# Patient Record
Sex: Female | Born: 1945 | Race: White | Hispanic: No | Smoking: Former smoker
Health system: Southern US, Community
[De-identification: ages and names within clinical notes are randomized; demographics above are authoritative.]

## PROBLEM LIST (undated history)

## (undated) DIAGNOSIS — M545 Low back pain, unspecified: Secondary | ICD-10-CM

## (undated) DIAGNOSIS — M1990 Unspecified osteoarthritis, unspecified site: Secondary | ICD-10-CM

## (undated) DIAGNOSIS — D3501 Benign neoplasm of right adrenal gland: Secondary | ICD-10-CM

## (undated) DIAGNOSIS — J3089 Other allergic rhinitis: Secondary | ICD-10-CM

## (undated) DIAGNOSIS — I2510 Atherosclerotic heart disease of native coronary artery without angina pectoris: Secondary | ICD-10-CM

## (undated) DIAGNOSIS — K589 Irritable bowel syndrome without diarrhea: Secondary | ICD-10-CM

## (undated) DIAGNOSIS — R011 Cardiac murmur, unspecified: Secondary | ICD-10-CM

## (undated) DIAGNOSIS — E669 Obesity, unspecified: Secondary | ICD-10-CM

## (undated) DIAGNOSIS — I358 Other nonrheumatic aortic valve disorders: Secondary | ICD-10-CM

## (undated) DIAGNOSIS — J432 Centrilobular emphysema: Secondary | ICD-10-CM

## (undated) DIAGNOSIS — K529 Noninfective gastroenteritis and colitis, unspecified: Secondary | ICD-10-CM

## (undated) DIAGNOSIS — K219 Gastro-esophageal reflux disease without esophagitis: Secondary | ICD-10-CM

## (undated) DIAGNOSIS — E785 Hyperlipidemia, unspecified: Secondary | ICD-10-CM

## (undated) DIAGNOSIS — J449 Chronic obstructive pulmonary disease, unspecified: Secondary | ICD-10-CM

## (undated) DIAGNOSIS — N63 Unspecified lump in unspecified breast: Secondary | ICD-10-CM

## (undated) DIAGNOSIS — R29898 Other symptoms and signs involving the musculoskeletal system: Secondary | ICD-10-CM

## (undated) DIAGNOSIS — J45909 Unspecified asthma, uncomplicated: Secondary | ICD-10-CM

## (undated) DIAGNOSIS — I10 Essential (primary) hypertension: Secondary | ICD-10-CM

## (undated) DIAGNOSIS — Z87891 Personal history of nicotine dependence: Secondary | ICD-10-CM

## (undated) DIAGNOSIS — M8580 Other specified disorders of bone density and structure, unspecified site: Secondary | ICD-10-CM

## (undated) DIAGNOSIS — Z8619 Personal history of other infectious and parasitic diseases: Secondary | ICD-10-CM

## (undated) HISTORY — DX: Cardiac murmur, unspecified: R01.1

## (undated) HISTORY — DX: Other allergic rhinitis: J30.89

## (undated) HISTORY — DX: Other symptoms and signs involving the musculoskeletal system: R29.898

## (undated) HISTORY — DX: Low back pain, unspecified: M54.50

## (undated) HISTORY — DX: Irritable bowel syndrome, unspecified: K58.9

## (undated) HISTORY — DX: Centrilobular emphysema: J43.2

## (undated) HISTORY — DX: Hyperlipidemia, unspecified: E78.5

## (undated) HISTORY — DX: Benign neoplasm of right adrenal gland: D35.01

## (undated) HISTORY — DX: Noninfective gastroenteritis and colitis, unspecified: K52.9

## (undated) HISTORY — DX: Obesity, unspecified: E66.9

## (undated) HISTORY — DX: Unspecified asthma, uncomplicated: J45.909

## (undated) HISTORY — DX: Gastro-esophageal reflux disease without esophagitis: K21.9

## (undated) HISTORY — DX: Other specified disorders of bone density and structure, unspecified site: M85.80

## (undated) HISTORY — DX: Personal history of other infectious and parasitic diseases: Z86.19

## (undated) HISTORY — DX: Unspecified osteoarthritis, unspecified site: M19.90

## (undated) HISTORY — DX: Other nonrheumatic aortic valve disorders: I35.8

## (undated) HISTORY — DX: Personal history of nicotine dependence: Z87.891

## (undated) HISTORY — DX: Atherosclerotic heart disease of native coronary artery without angina pectoris: I25.10

## (undated) HISTORY — DX: Low back pain: M54.5

## (undated) HISTORY — DX: Unspecified lump in unspecified breast: N63.0

## (undated) HISTORY — DX: Chronic obstructive pulmonary disease, unspecified: J44.9

## (undated) HISTORY — DX: Essential (primary) hypertension: I10

---

## 1993-07-01 HISTORY — PX: BREAST LUMPECTOMY: SHX2

## 2003-07-02 DIAGNOSIS — J449 Chronic obstructive pulmonary disease, unspecified: Secondary | ICD-10-CM

## 2003-07-02 HISTORY — DX: Chronic obstructive pulmonary disease, unspecified: J44.9

## 2004-12-29 HISTORY — PX: CARDIOVASCULAR STRESS TEST: SHX262

## 2008-07-01 HISTORY — PX: KNEE SURGERY: SHX244

## 2008-07-01 HISTORY — PX: ESOPHAGOGASTRODUODENOSCOPY: SHX1529

## 2008-07-01 HISTORY — PX: COLONOSCOPY: SHX174

## 2008-07-01 LAB — HM COLONOSCOPY

## 2008-09-23 ENCOUNTER — Encounter: Admission: RE | Admit: 2008-09-23 | Discharge: 2008-09-23 | Payer: Self-pay | Admitting: Orthopedic Surgery

## 2009-06-02 ENCOUNTER — Ambulatory Visit: Payer: Self-pay | Admitting: General Surgery

## 2009-07-01 DIAGNOSIS — K529 Noninfective gastroenteritis and colitis, unspecified: Secondary | ICD-10-CM

## 2009-07-01 HISTORY — DX: Noninfective gastroenteritis and colitis, unspecified: K52.9

## 2010-07-01 DIAGNOSIS — N63 Unspecified lump in unspecified breast: Secondary | ICD-10-CM

## 2010-07-01 HISTORY — DX: Unspecified lump in unspecified breast: N63.0

## 2010-07-01 HISTORY — PX: BREAST BIOPSY: SHX20

## 2011-01-01 LAB — LIPID PANEL
CHOLESTEROL, TOTAL: 203
HDL: 68 mg/dL (ref 35–70)
LDL CALC: 118
TRIGLYCERIDES: 86

## 2011-01-01 LAB — CBC
HGB: 13 g/dL
PLATELET COUNT: 233
WBC: 5.7

## 2011-01-01 LAB — COMPREHENSIVE METABOLIC PANEL
ALK PHOS: 67 U/L
ALT: 14
AST: 14 U/L
Bilirubin: 0.6
Creat: 0.86
GLUCOSE: 110
Sodium: 143 mmol/L (ref 137–147)

## 2011-01-01 LAB — VITAMIN D 25 HYDROXY (VIT D DEFICIENCY, FRACTURES): Vit D, 25-Hydroxy: 34

## 2011-01-01 LAB — TSH: TSH: 1.79

## 2012-07-01 DIAGNOSIS — I358 Other nonrheumatic aortic valve disorders: Secondary | ICD-10-CM

## 2012-07-01 HISTORY — PX: KNEE SURGERY: SHX244

## 2012-07-01 HISTORY — DX: Other nonrheumatic aortic valve disorders: I35.8

## 2012-12-15 ENCOUNTER — Encounter: Payer: Self-pay | Admitting: *Deleted

## 2012-12-15 DIAGNOSIS — N63 Unspecified lump in unspecified breast: Secondary | ICD-10-CM | POA: Insufficient documentation

## 2013-01-22 ENCOUNTER — Ambulatory Visit: Payer: Self-pay | Admitting: Specialist

## 2013-04-15 ENCOUNTER — Ambulatory Visit: Payer: Self-pay | Admitting: Specialist

## 2013-04-15 LAB — HEMOGLOBIN: HGB: 14.1 g/dL (ref 12.0–16.0)

## 2013-04-15 LAB — BASIC METABOLIC PANEL
Anion Gap: 5 — ABNORMAL LOW (ref 7–16)
BUN: 13 mg/dL (ref 7–18)
Chloride: 106 mmol/L (ref 98–107)
Glucose: 146 mg/dL — ABNORMAL HIGH (ref 65–99)
Osmolality: 280 (ref 275–301)
Potassium: 3.5 mmol/L (ref 3.5–5.1)
Sodium: 139 mmol/L (ref 136–145)

## 2013-04-23 ENCOUNTER — Ambulatory Visit: Payer: Self-pay | Admitting: Specialist

## 2013-05-18 ENCOUNTER — Encounter: Payer: Self-pay | Admitting: General Surgery

## 2013-06-08 ENCOUNTER — Encounter: Payer: Self-pay | Admitting: General Surgery

## 2013-06-08 ENCOUNTER — Ambulatory Visit (INDEPENDENT_AMBULATORY_CARE_PROVIDER_SITE_OTHER): Payer: Medicare Other | Admitting: General Surgery

## 2013-06-08 VITALS — BP 162/62 | HR 78 | Resp 16 | Ht 65.0 in | Wt 222.0 lb

## 2013-06-08 DIAGNOSIS — Z1239 Encounter for other screening for malignant neoplasm of breast: Secondary | ICD-10-CM | POA: Insufficient documentation

## 2013-06-08 NOTE — Patient Instructions (Signed)
Patient to return in 1 year with a screening mammogram. Patient to call with any new questions or concerns.

## 2013-06-08 NOTE — Progress Notes (Signed)
Patient ID: Monica Wise, female   DOB: 03/11/1946, 67 y.o.   MRN: [id number]  Chief Complaint  Patient presents with  . Follow-up    1 year screening mammogram    HPI Monica Wise is a 67 y.o. female who presents for a breast evaluation. The most recent mammogram was done on 05/17/13. Patient does perform regular self breast checks and gets regular mammograms done.  The patient denies any new breast problems at this time.    HPI  Past Medical History  Diagnosis Date  . Allergy   . Asthma   . Personal history of tobacco use, presenting hazards to health   . Diarrhea 2011  . COPD (chronic obstructive pulmonary disease) 2005  . Arthritis   . Lump or mass in breast 2012    right breast US guided finesse 1 o'clock  . Special screening for malignant neoplasms, colon   . Obesity, unspecified   . Hyperlipidemia   . GERD (gastroesophageal reflux disease)   . Heart murmur     Past Surgical History  Procedure Laterality Date  . Breast surgery Left 1995    lumpectomy, benign mass  . Knee surgery Right 2010    arthroscopic  . Colonoscopy  2010    Dr. Lemar Livings  . Breast biopsy Right 2012    Family History  Problem Relation Age of Onset  . Diverticulitis Mother   . Cancer Other     unknown family member with colon cancer  . Colon polyps Other     unknown family member    Social History History  Substance Use Topics  . Smoking status: Former Smoker -- 1.00 packs/day for 30 years    Types: Cigarettes  . Smokeless tobacco: Not on file  . Alcohol Use: Yes     Comment: ocassionally    Allergies  Allergen Reactions  . Codeine Nausea And Vomiting  . Demerol [Meperidine] Nausea And Vomiting  . Sulphur [Sulfur] Other (See Comments)    Unknown reaction per patient.     Current Outpatient Prescriptions  Medication Sig Dispense Refill  . albuterol (PROVENTIL HFA;VENTOLIN HFA) 108 (90 BASE) MCG/ACT inhaler Inhale 1-2 puffs into the lungs every 6 (six) hours as needed for  wheezing or shortness of breath.      Marland Kitchen aspirin 81 MG tablet Take 81 mg by mouth daily.      . cetirizine (ZYRTEC) 10 MG tablet Take 10 mg by mouth daily.      . Cholecalciferol (VITAMIN D-3 PO) Take by mouth.      . Glucosamine-Chondroitin (MOVE FREE PO) Take by mouth.      . Loperamide HCl (ANTI-DIARRHEAL PO) Take by mouth.      . meloxicam (MOBIC) 15 MG tablet       . montelukast (SINGULAIR) 10 MG tablet       . Multiple Vitamin (MULTIVITAMIN) tablet Take 1 tablet by mouth daily.      . OMEGA-3 KRILL OIL PO Take by mouth.      Marland Kitchen omeprazole (PRILOSEC) 20 MG capsule       . OxyCODONE (OXYCONTIN) 10 mg T12A 12 hr tablet Take 10 mg by mouth every 12 (twelve) hours.      . Pseudoephedrine HCl (NASAL DECONGESTANT PO) Take by mouth.      . SPIRIVA HANDIHALER 18 MCG inhalation capsule       . SYMBICORT 160-4.5 MCG/ACT inhaler       . UNABLE TO FIND Med Name: Monica Wise      .  ZETIA 10 MG tablet        No current facility-administered medications for this visit.    Review of Systems Review of Systems  Constitutional: Negative.   Respiratory: Negative.   Cardiovascular: Negative.     Blood pressure 162/62, pulse 78, resp. rate 16, height 5\' 5"  (1.651 m), weight 222 lb (100.699 kg).  Physical Exam Physical Exam  Constitutional: She is oriented to person, place, and time. She appears well-developed and well-nourished.  Neck: No thyromegaly present.  Cardiovascular: Normal rate and regular rhythm.   Murmur heard.  Systolic murmur is present with a grade of 3/6  Pulmonary/Chest: Effort normal and breath sounds normal. Right breast exhibits no inverted nipple, no mass, no nipple discharge, no skin change and no tenderness. Left breast exhibits no inverted nipple, no mass, no nipple discharge, no skin change and no tenderness.  Lymphadenopathy:    She has no cervical adenopathy.    She has no axillary adenopathy.  Neurological: She is alert and oriented to person, place, and time.  Skin:  Skin is warm and dry.    Data Reviewed Bilateral mammograms dated May 17, 2013 complete at [facility] were reviewed. No interval change. Stable nodularity. BI-RAD-2.  Assessment    Benign breast exam.     Plan    The patient was offered to have her annual exams completed with her PCP. She declined. Followup examination here in one year with repeat bilateral screening mammograms.        Monica Wise 06/08/2013, 9:26 PM

## 2013-07-06 ENCOUNTER — Encounter: Payer: Self-pay | Admitting: General Surgery

## 2014-05-02 ENCOUNTER — Encounter: Payer: Self-pay | Admitting: General Surgery

## 2014-05-24 ENCOUNTER — Encounter: Payer: Self-pay | Admitting: General Surgery

## 2014-05-24 ENCOUNTER — Ambulatory Visit (INDEPENDENT_AMBULATORY_CARE_PROVIDER_SITE_OTHER): Payer: Medicare Other | Admitting: General Surgery

## 2014-05-24 VITALS — BP 134/72 | HR 82 | Resp 16 | Ht 65.0 in | Wt 223.0 lb

## 2014-05-24 DIAGNOSIS — Z853 Personal history of malignant neoplasm of breast: Secondary | ICD-10-CM

## 2014-05-24 DIAGNOSIS — Z1239 Encounter for other screening for malignant neoplasm of breast: Secondary | ICD-10-CM

## 2014-05-24 NOTE — Progress Notes (Signed)
Patient ID: Monica Wise, female   DOB: 04-Feb-1946, 68 y.o.   MRN: [id number]  Chief Complaint  Patient presents with  . Follow-up    1 year bilateral screening mammogram     HPI Monica Wise is a 68 y.o. female who presents for a breast evaluation. The most recent mammogram was done on 05/18/14. Patient does perform occasional self breast checks and gets regular mammograms done.  The patient denies any new problems at this time.    HPI  Past Medical History  Diagnosis Date  . Allergy   . Asthma   . Personal history of tobacco use, presenting hazards to health   . Diarrhea 2011  . COPD (chronic obstructive pulmonary disease) 2005  . Arthritis   . Lump or mass in breast 2012    right breast US guided finesse 1 o'clock  . Special screening for malignant neoplasms, colon   . Obesity, unspecified   . Hyperlipidemia   . GERD (gastroesophageal reflux disease)   . Heart murmur     Past Surgical History  Procedure Laterality Date  . Breast surgery Left 1995    lumpectomy, benign mass  . Knee surgery Right 2010    arthroscopic  . Colonoscopy  2010    Dr. Bary Castilla, hyperplastic polyps identified. Follow-up exam planned for 2020.  Marland Kitchen Breast biopsy Right 2012  . Knee surgery Left 2014    Family History  Problem Relation Age of Onset  . Diverticulitis Mother   . Cancer Other     unknown family member with colon cancer  . Colon polyps Other     unknown family member    Social History History  Substance Use Topics  . Smoking status: Former Smoker -- 1.00 packs/day for 30 years    Types: Cigarettes  . Smokeless tobacco: Not on file  . Alcohol Use: Yes     Comment: ocassionally    Allergies  Allergen Reactions  . Codeine Nausea And Vomiting  . Demerol [Meperidine] Nausea And Vomiting  . Sulphur [Sulfur] Other (See Comments)    Unknown reaction per patient.     Current Outpatient Prescriptions  Medication Sig Dispense Refill  . albuterol (PROVENTIL HFA;VENTOLIN HFA) 108  (90 BASE) MCG/ACT inhaler Inhale 1-2 puffs into the lungs every 6 (six) hours as needed for wheezing or shortness of breath.    Marland Kitchen aspirin 81 MG tablet Take 81 mg by mouth daily.    . cetirizine (ZYRTEC) 10 MG tablet Take 10 mg by mouth daily.    . Cholecalciferol (VITAMIN D-3 PO) Take by mouth.    . Glucosamine-Chondroitin (MOVE FREE PO) Take by mouth.    . Loperamide HCl (ANTI-DIARRHEAL PO) Take by mouth.    . montelukast (SINGULAIR) 10 MG tablet     . Multiple Vitamin (MULTIVITAMIN) tablet Take 1 tablet by mouth daily.    . OMEGA-3 KRILL OIL PO Take by mouth.    Marland Kitchen omeprazole (PRILOSEC) 20 MG capsule     . OxyCODONE (OXYCONTIN) 10 mg T12A 12 hr tablet Take 10 mg by mouth as needed.     . Pseudoephedrine HCl (NASAL DECONGESTANT PO) Take by mouth.    . SPIRIVA HANDIHALER 18 MCG inhalation capsule     . SYMBICORT 160-4.5 MCG/ACT inhaler     . UNABLE TO FIND Med Name: Margaretmary Lombard    . ZETIA 10 MG tablet      No current facility-administered medications for this visit.    Review of Systems Review of  Systems  Constitutional: Negative.   Respiratory: Negative.   Cardiovascular: Negative.     Blood pressure 134/72, pulse 82, resp. rate 16, height 5\' 5"  (1.651 m), weight 223 lb (101.152 kg).  Physical Exam Physical Exam  Constitutional: She is oriented to person, place, and time. She appears well-developed and well-nourished.  Neck: Neck supple. No thyromegaly present.  Cardiovascular: Normal rate and regular rhythm.   Murmur heard.  Systolic murmur is present with a grade of 2/6  Pulmonary/Chest: Effort normal and breath sounds normal. Right breast exhibits no inverted nipple, no mass, no nipple discharge, no skin change and no tenderness. Left breast exhibits no inverted nipple, no mass, no nipple discharge, no skin change and no tenderness.  Lymphadenopathy:    She has no cervical adenopathy.    She has no axillary adenopathy.  Neurological: She is alert and oriented to person, place,  and time.  Skin: Skin is warm and dry.    Data Reviewed Bilateral screening mammograms dated 05/18/2014 were reviewed and compared to previous studies. BI-RADS-2.  Assessment    Benign breast exam.    Plan    The patient's PCP is left the area. A list of potential choices will be provided to the patient. We'll plan for a follow-up exam in one year with screening mammograms prior to that visit.    PCP:  No Pcp Per Patient   Robert Bellow 05/25/2014, 2:32 PM

## 2014-05-24 NOTE — Patient Instructions (Signed)
Patient to return in 1 year with bilateral screening mammogram. Continue self breast exams. Call office for any new breast issues or concerns.  

## 2014-05-25 ENCOUNTER — Encounter: Payer: Self-pay | Admitting: General Surgery

## 2014-06-06 ENCOUNTER — Encounter: Payer: Self-pay | Admitting: General Surgery

## 2014-06-17 ENCOUNTER — Ambulatory Visit (INDEPENDENT_AMBULATORY_CARE_PROVIDER_SITE_OTHER): Payer: Medicare Other | Admitting: Family Medicine

## 2014-06-17 ENCOUNTER — Ambulatory Visit: Payer: Self-pay | Admitting: Family Medicine

## 2014-06-17 ENCOUNTER — Encounter: Payer: Self-pay | Admitting: Family Medicine

## 2014-06-17 ENCOUNTER — Telehealth: Payer: Self-pay | Admitting: Family Medicine

## 2014-06-17 VITALS — BP 196/90 | HR 71 | Temp 98.2°F | Ht 65.0 in | Wt 221.0 lb

## 2014-06-17 DIAGNOSIS — I10 Essential (primary) hypertension: Secondary | ICD-10-CM | POA: Insufficient documentation

## 2014-06-17 DIAGNOSIS — M170 Bilateral primary osteoarthritis of knee: Secondary | ICD-10-CM

## 2014-06-17 DIAGNOSIS — Z87891 Personal history of nicotine dependence: Secondary | ICD-10-CM | POA: Insufficient documentation

## 2014-06-17 DIAGNOSIS — M1990 Unspecified osteoarthritis, unspecified site: Secondary | ICD-10-CM | POA: Insufficient documentation

## 2014-06-17 DIAGNOSIS — K219 Gastro-esophageal reflux disease without esophagitis: Secondary | ICD-10-CM | POA: Insufficient documentation

## 2014-06-17 DIAGNOSIS — E785 Hyperlipidemia, unspecified: Secondary | ICD-10-CM | POA: Insufficient documentation

## 2014-06-17 DIAGNOSIS — E669 Obesity, unspecified: Secondary | ICD-10-CM

## 2014-06-17 DIAGNOSIS — J432 Centrilobular emphysema: Secondary | ICD-10-CM | POA: Insufficient documentation

## 2014-06-17 DIAGNOSIS — J449 Chronic obstructive pulmonary disease, unspecified: Secondary | ICD-10-CM

## 2014-06-17 DIAGNOSIS — K529 Noninfective gastroenteritis and colitis, unspecified: Secondary | ICD-10-CM | POA: Insufficient documentation

## 2014-06-17 DIAGNOSIS — J3089 Other allergic rhinitis: Secondary | ICD-10-CM | POA: Insufficient documentation

## 2014-06-17 DIAGNOSIS — J309 Allergic rhinitis, unspecified: Secondary | ICD-10-CM

## 2014-06-17 MED ORDER — TIOTROPIUM BROMIDE MONOHYDRATE 18 MCG IN CAPS: 18.0000 ug | ORAL_CAPSULE | Freq: Every day | RESPIRATORY_TRACT | Status: DC

## 2014-06-17 MED ORDER — AMLODIPINE BESYLATE 5 MG PO TABS
5.0000 mg | ORAL_TABLET | Freq: Every day | ORAL | Status: DC
Start: 2014-06-17 — End: 2014-07-04

## 2014-06-17 MED ORDER — BUDESONIDE-FORMOTEROL FUMARATE 160-4.5 MCG/ACT IN AERO: 2.0000 | INHALATION_SPRAY | Freq: Two times a day (BID) | RESPIRATORY_TRACT | Status: DC

## 2014-06-17 MED ORDER — EZETIMIBE 10 MG PO TABS: 10.0000 mg | ORAL_TABLET | Freq: Every day | ORAL | Status: DC

## 2014-06-17 NOTE — Progress Notes (Signed)
Pre visit review using our clinic review tool, if applicable. No additional management support is needed unless otherwise documented below in the visit note. 

## 2014-06-17 NOTE — Progress Notes (Signed)
BP 196/90 mmHg  Pulse 71  Temp(Src) 98.2 F (36.8 C) (Tympanic)  Ht 5\' 5"  (1.651 m)  Wt 221 lb (100.245 kg)  BMI 36.78 kg/m2  SpO2 97%   CC: new pt to establish care  Subjective:    Patient ID: Monica Wise, female    DOB: 1945/08/15, 68 y.o.   MRN: [id number]  HPI: Monica Wise is a 68 y.o. female presenting on 06/17/2014 for Establish Care   Presents to establish care. Prior saw Willia Craze at [facility].   Wonders about IBS - doesn't do any appointments in the morning. Dicyclomine did not help in past. Diarrhea may have started after NSAID use.  Knee arthritis - s/p L knee surgery by Dr Tamala Julian 2014 (?meniscectomy). S/p R knee surgery 2011. On oxycodone prn. But states tramadol works better. Has cane but doesn't use.  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 Stable back pain. Occasional shooting pain down legs. No numbness of legs. Numbness in hands R>L - worse with driving. H/o CTS.   HLD - intolerant to crestor in past due to myalgias. Now on zetia, without myalgias. Declines further statin trial at this time.  Chronic sinus congestion - with perennial allergic rhinitis. Taking claritin and singulair and pseudophed regularly.   HTN - no h/o this in past. Markedly elevated today.  No HA, vision changes, CP/tightness, SOB, leg swelling.  Preventative: Last CPE 03/2013 Mammogram 05/2014 Flu shot - done Presumed pneumovax 2001 G1P1  Lives alone, 2 feral cats Occupation: retired, worked for [employer] Edu: [employer] Activity: works in yard, unable to walk 2/2 knee pain Diet: good water, fruits/vegetables daily  Relevant past medical, surgical, family and social history reviewed and updated as indicated. Interim medical history since our last visit reviewed. Allergies and medications reviewed and updated. Current Outpatient Prescriptions on File Prior to Visit  Medication Sig  . albuterol (PROVENTIL HFA;VENTOLIN HFA) 108 (90 BASE) MCG/ACT inhaler Inhale 1-2 puffs into the lungs  every 6 (six) hours as needed for wheezing or shortness of breath.  Marland Kitchen aspirin 81 MG tablet Take 81 mg by mouth daily.  . cetirizine (ZYRTEC) 10 MG tablet Take 10 mg by mouth daily.  . Cholecalciferol (VITAMIN D-3 PO) Take by mouth.  . Loperamide HCl (ANTI-DIARRHEAL PO) Take by mouth.  . montelukast (SINGULAIR) 10 MG tablet Take 10 mg by mouth at bedtime.   . Multiple Vitamin (MULTIVITAMIN) tablet Take 1 tablet by mouth daily.  Marland Kitchen omeprazole (PRILOSEC) 20 MG capsule Take 20 mg by mouth daily.   . Pseudoephedrine HCl (NASAL DECONGESTANT PO) Take 1 tablet by mouth daily as needed.    No current facility-administered medications on file prior to visit.    Review of Systems Per HPI unless specifically indicated above     Objective:    BP 196/90 mmHg  Pulse 71  Temp(Src) 98.2 F (36.8 C) (Tympanic)  Ht 5\' 5"  (1.651 m)  Wt 221 lb (100.245 kg)  BMI 36.78 kg/m2  SpO2 97%  Wt Readings from Last 3 Encounters:  06/17/14 221 lb (100.245 kg)  05/24/14 223 lb (101.152 kg)  06/08/13 222 lb (100.699 kg)    Physical Exam  Constitutional: She appears well-developed and well-nourished. No distress.  HENT:  Head: Normocephalic and atraumatic.  Mouth/Throat: Oropharynx is clear and moist. No oropharyngeal exudate.  Eyes: Conjunctivae and EOM are normal. Pupils are equal, round, and reactive to light. No scleral icterus.  Neck: Normal range of motion. Neck supple. No thyromegaly present.  Cardiovascular: Normal rate, regular rhythm and intact distal pulses.   Murmur (3/6 SEM best at LUSB) heard. Pulmonary/Chest: Effort normal and breath sounds normal. No respiratory distress. She has no wheezes. She has no rales.  Musculoskeletal: She exhibits no edema.  Lymphadenopathy:    She has no cervical adenopathy.  Skin: Skin is warm and dry. No rash noted.  Psychiatric: She has a normal mood and affect.  Nursing note and vitals reviewed.  No results found for this or any previous visit.      Assessment & Plan:   Problem List Items Addressed This Visit    Perennial allergic rhinitis    Advised against using pseudophed decongestant.    Osteoarthritis    Predominantly knees. S/p bilateral knee surgery. Discussed may need replacement to address chronic knee pain if severe OA, not interested in further ortho evaluation for knees,     Relevant Medications      oxyCODONE-acetaminophen (PERCOCET/ROXICET) 5-325 MG per tablet   Obesity    Unable to exercise 2/2 knee pain.    Hyperlipidemia    Continue zetia, check FLP next fasting labs. Intolerant to crestor, does not want any further statins    Relevant Medications      furosemide (LASIX) 20 MG tablet      ezetimibe (ZETIA) 10 MG tablet      amLODIpine (NORVASC) tablet   GERD (gastroesophageal reflux disease)    Continue omperazole daily, pepcid prn.    Relevant Medications      famotidine (PEPCID) 20 MG tablet   Ex-smoker    Continue to encourage abstinence    Essential hypertension - Primary    Marked elevation today - discussed reasons for treating hypertension, advised stop decongestant. Provided with DASH diet handout. Pt will monitor bp at home with her home cuff, and if persistently >140/90 will start amlodipine 5mg  daily prescription provided today. Pt agrees with plan.    Relevant Medications      furosemide (LASIX) 20 MG tablet      ezetimibe (ZETIA) 10 MG tablet      amLODIpine (NORVASC) tablet   COPD (chronic obstructive pulmonary disease)    symbicort and spiriva refilled today.    Relevant Medications      tiotropium (SPIRIVA HANDIHALER) 18 MCG inhalation capsule      budesonide-formoterol (SYMBICORT) 160-4.5 MCG/ACT inhaler   Chronic diarrhea    ?IBS related continue to monitor on prn immodium.        Follow up plan: Return in about 3 months (around 09/16/2014), or as needed, for medicare wellness.

## 2014-06-17 NOTE — Assessment & Plan Note (Signed)
symbicort and spiriva refilled today.

## 2014-06-17 NOTE — Patient Instructions (Addendum)
Check which pain medication you take at home and let me know.  Call your insurance about the shingles shot to see if it is covered or how much it would cost and where is cheaper (here or pharmacy).  If you want to receive here, call for nurse visit. Return as needed or in 3 months for medicare wellness visit Return in 2 wks for bp check.  Your blood pressure was too high! Monitor blood pressure at home over next 1-2 days and if persistently >!40/90, start amlodipine 5mg  daily. Prescription printed out today. Work on decreased salt and sodium in the diet.  DASH Eating Plan DASH stands for "Dietary Approaches to Stop Hypertension." The DASH eating plan is a healthy eating plan that has been shown to reduce high blood pressure (hypertension). Additional health benefits may include reducing the risk of type 2 diabetes mellitus, heart disease, and stroke. The DASH eating plan may also help with weight loss. WHAT DO I NEED TO KNOW ABOUT THE DASH EATING PLAN? For the DASH eating plan, you will follow these general guidelines:  Choose foods with a percent daily value for sodium of less than 5% (as listed on the food label).  Use salt-free seasonings or herbs instead of table salt or sea salt.  Check with your health care provider or pharmacist before using salt substitutes.  Eat lower-sodium products, often labeled as "lower sodium" or "no salt added."  Eat fresh foods.  Eat more vegetables, fruits, and low-fat dairy products.  Choose whole grains. Look for the word "whole" as the first word in the ingredient list.  Choose fish and skinless chicken or [place] more often than red meat. Limit fish, poultry, and meat to 6 oz (170 g) each day.  Limit sweets, desserts, sugars, and sugary drinks.  Choose heart-healthy fats.  Limit cheese to 1 oz (28 g) per day.  Eat more home-cooked food and less restaurant, buffet, and fast food.  Limit fried foods.  Cook foods using methods other than  frying.  Limit canned vegetables. If you do use them, rinse them well to decrease the sodium.  When eating at a restaurant, ask that your food be prepared with less salt, or no salt if possible. WHAT FOODS CAN I EAT? Seek help from a dietitian for individual calorie needs. Grains Whole grain or whole wheat bread. Brown rice. Whole grain or whole wheat pasta. Quinoa, bulgur, and whole grain cereals. Low-sodium cereals. Corn or whole wheat flour tortillas. Whole grain cornbread. Whole grain crackers. Low-sodium crackers. Vegetables Fresh or frozen vegetables (raw, steamed, roasted, or grilled). Low-sodium or reduced-sodium tomato and vegetable juices. Low-sodium or reduced-sodium tomato sauce and paste. Low-sodium or reduced-sodium canned vegetables.  Fruits All fresh, canned (in natural juice), or frozen fruits. Meat and Other Protein Products Ground beef (85% or leaner), grass-fed beef, or beef trimmed of fat. Skinless chicken or [place]. Ground chicken or [place]. Pork trimmed of fat. All fish and seafood. Eggs. Dried beans, peas, or lentils. Unsalted nuts and seeds. Unsalted canned beans. Dairy Low-fat dairy products, such as skim or 1% milk, 2% or reduced-fat cheeses, low-fat ricotta or cottage cheese, or plain low-fat yogurt. Low-sodium or reduced-sodium cheeses. Fats and Oils Tub margarines without trans fats. Light or reduced-fat mayonnaise and salad dressings (reduced sodium). Avocado. Safflower, olive, or canola oils. Natural peanut or almond butter. Other Unsalted popcorn and pretzels. The items listed above may not be a complete list of recommended foods or beverages. Contact your dietitian for more options.  WHAT FOODS ARE NOT RECOMMENDED? Grains White bread. White pasta. White rice. Refined cornbread. Bagels and croissants. Crackers that contain trans fat. Vegetables Creamed or fried vegetables. Vegetables in a cheese sauce. Regular canned vegetables. Regular canned tomato sauce  and paste. Regular tomato and vegetable juices. Fruits Dried fruits. Canned fruit in light or heavy syrup. Fruit juice. Meat and Other Protein Products Fatty cuts of meat. Ribs, chicken wings, bacon, sausage, bologna, salami, chitterlings, fatback, hot dogs, bratwurst, and packaged luncheon meats. Salted nuts and seeds. Canned beans with salt. Dairy Whole or 2% milk, cream, half-and-half, and cream cheese. Whole-fat or sweetened yogurt. Full-fat cheeses or blue cheese. Nondairy creamers and whipped toppings. Processed cheese, cheese spreads, or cheese curds. Condiments Onion and garlic salt, seasoned salt, table salt, and sea salt. Canned and packaged gravies. Worcestershire sauce. Tartar sauce. Barbecue sauce. Teriyaki sauce. Soy sauce, including reduced sodium. Steak sauce. Fish sauce. Oyster sauce. Cocktail sauce. Horseradish. Ketchup and mustard. Meat flavorings and tenderizers. Bouillon cubes. Hot sauce. Tabasco sauce. Marinades. Taco seasonings. Relishes. Fats and Oils Butter, stick margarine, lard, shortening, ghee, and bacon fat. Coconut, palm kernel, or palm oils. Regular salad dressings. Other Pickles and olives. Salted popcorn and pretzels. The items listed above may not be a complete list of foods and beverages to avoid. Contact your dietitian for more information. WHERE CAN I FIND MORE INFORMATION? National Heart, Lung, and Blood Institute: travelstabloid.com Document Released: 06/06/2011 Document Revised: 11/01/2013 Document Reviewed: 04/21/2013 [facility] Patient Information 2015 [place], [place]. This information is not intended to replace advice given to you by your health care provider. Make sure you discuss any questions you have with your health care provider.

## 2014-06-17 NOTE — Assessment & Plan Note (Signed)
Marked elevation today - discussed reasons for treating hypertension, advised stop decongestant. Provided with DASH diet handout. Pt will monitor bp at home with her home cuff, and if persistently >140/90 will start amlodipine 5mg  daily prescription provided today. Pt agrees with plan.

## 2014-06-17 NOTE — Assessment & Plan Note (Signed)
Continue to encourage abstinence

## 2014-06-17 NOTE — Assessment & Plan Note (Signed)
Unable to exercise 2/2 knee pain.

## 2014-06-17 NOTE — Assessment & Plan Note (Signed)
Continue zetia, check FLP next fasting labs. Intolerant to crestor, does not want any further statins

## 2014-06-17 NOTE — Assessment & Plan Note (Addendum)
Predominantly knees. S/p bilateral knee surgery. Discussed may need replacement to address chronic knee pain if severe OA, not interested in further ortho evaluation for knees,

## 2014-06-17 NOTE — Telephone Encounter (Signed)
Noted. Chart updated.

## 2014-06-17 NOTE — Assessment & Plan Note (Signed)
Advised against using pseudophed decongestant.

## 2014-06-17 NOTE — Telephone Encounter (Signed)
Patient called and said she was suppose to let you know Oxycodone HCL 5mg .

## 2014-06-17 NOTE — Assessment & Plan Note (Signed)
?  IBS related continue to monitor on prn immodium.

## 2014-06-17 NOTE — Assessment & Plan Note (Signed)
Continue omperazole daily, pepcid prn.

## 2014-06-20 ENCOUNTER — Telehealth: Payer: Self-pay | Admitting: Family Medicine

## 2014-06-20 NOTE — Telephone Encounter (Signed)
emmi mailed  °

## 2014-06-22 ENCOUNTER — Ambulatory Visit: Payer: BC Managed Care – PPO | Admitting: General Surgery

## 2014-06-22 ENCOUNTER — Ambulatory Visit: Payer: Self-pay | Admitting: Internal Medicine

## 2014-07-02 ENCOUNTER — Encounter: Payer: Self-pay | Admitting: Family Medicine

## 2014-07-02 DIAGNOSIS — K589 Irritable bowel syndrome without diarrhea: Secondary | ICD-10-CM | POA: Insufficient documentation

## 2014-07-04 ENCOUNTER — Encounter: Payer: Self-pay | Admitting: Family Medicine

## 2014-07-04 ENCOUNTER — Ambulatory Visit (INDEPENDENT_AMBULATORY_CARE_PROVIDER_SITE_OTHER): Payer: Medicare Other | Admitting: Family Medicine

## 2014-07-04 VITALS — BP 190/90 | HR 76 | Temp 98.0°F | Wt 219.8 lb

## 2014-07-04 DIAGNOSIS — I10 Essential (primary) hypertension: Secondary | ICD-10-CM

## 2014-07-04 DIAGNOSIS — Z23 Encounter for immunization: Secondary | ICD-10-CM | POA: Diagnosis not present

## 2014-07-04 MED ORDER — AMLODIPINE BESYLATE 2.5 MG PO TABS
2.5000 mg | ORAL_TABLET | Freq: Every day | ORAL | Status: DC
Start: 2014-07-04 — End: 2014-09-19

## 2014-07-04 NOTE — Assessment & Plan Note (Addendum)
Persistently elevated in office - will start amlodipine 2.5mg  daily. I recommended she buy new bp cuff for arm. Again discussed DASH diet.  RTC 1 mo for f/u HTN  Pt agrees with plan.

## 2014-07-04 NOTE — Patient Instructions (Addendum)
Consider getting automatic arm cuff (not forearm) at local pharmacy.  Let's start amlodipine 2.5mg  once daily - new dose sent to pharmacy. Return in 1 month for blood pressure follow up. zostavax today.

## 2014-07-04 NOTE — Addendum Note (Signed)
Addended by: Emelia Salisbury C on: 07/04/2014 04:59 PM   Modules accepted: Orders

## 2014-07-04 NOTE — Progress Notes (Signed)
BP 190/90 mmHg  Pulse 76  Temp(Src) 98 F (36.7 C) (Oral)  Wt 219 lb 12 oz (99.678 kg)   CC: 2 wk f/u HTN  Subjective:    Patient ID: Monica Wise, female    DOB: 02-27-1946, 69 y.o.   MRN: [id number]  HPI: Monica Wise is a 69 y.o. female presenting on 07/04/2014 for Follow-up   Patient established with our office on 06/17/2014 without known diagnosis of hypertension and at that time in office her blood pressure was 196/90.  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 At that time we recommended she stop decongestants and provided her with a DASH diet handout. We also provided her with a prescription for amlodipine 5mg  daily. She returns today for 2 wk f/u visit.  She did not start amlodipine. She has been monitoring at home [id number] systolic. No HA, vision changes, CP/tightness, leg swelling. Stays mildly dyspneic   She does have furosemide she takes intermittently.  She has had echo done at [facility] by Dr Ubaldo Glassing  Body mass index is 36.57 kg/(m^2).   Relevant past medical, surgical, family and social history reviewed and updated as indicated. Interim medical history since our last visit reviewed. Allergies and medications reviewed and updated. Current Outpatient Prescriptions on File Prior to Visit  Medication Sig  . albuterol (PROVENTIL HFA;VENTOLIN HFA) 108 (90 BASE) MCG/ACT inhaler Inhale 1-2 puffs into the lungs every 6 (six) hours as needed for wheezing or shortness of breath.  Marland Kitchen aspirin 81 MG tablet Take 81 mg by mouth daily.  . budesonide-formoterol (SYMBICORT) 160-4.5 MCG/ACT inhaler Inhale 2 puffs into the lungs 2 (two) times daily.  . cetirizine (ZYRTEC) 10 MG tablet Take 10 mg by mouth daily.  . Cholecalciferol (VITAMIN D-3 PO) Take by mouth.  . ezetimibe (ZETIA) 10 MG tablet Take 1 tablet (10 mg total) by mouth daily.  . famotidine (PEPCID) 20 MG tablet Take 20 mg by mouth daily.  . furosemide  (LASIX) 20 MG tablet Take 20 mg by mouth daily as needed for fluid.   . Glucosamine-Chondroitin (MOVE FREE PO) Take by mouth daily.  . Loperamide HCl (ANTI-DIARRHEAL PO) Take by mouth.  . Misc Natural Products (SUPER ENERGY HERBAL COMPLEX PO) Take by mouth. GINSANA  . montelukast (SINGULAIR) 10 MG tablet Take 10 mg by mouth at bedtime.   . Multiple Vitamin (MULTIVITAMIN) tablet Take 1 tablet by mouth daily.  . Omega-3 Krill Oil 500 MG CAPS Take 500 mg by mouth daily.  Marland Kitchen omeprazole (PRILOSEC) 20 MG capsule Take 20 mg by mouth daily.   Marland Kitchen oxycodone (OXY-IR) 5 MG capsule Take 5 mg by mouth every 6 (six) hours as needed for pain.  . potassium chloride (K-DUR) 10 MEQ tablet Take 10 mEq by mouth daily as needed. With lasix  . tiotropium (SPIRIVA HANDIHALER) 18 MCG inhalation capsule Place 1 capsule (18 mcg total) into inhaler and inhale daily.  . Pseudoephedrine HCl (NASAL DECONGESTANT PO) Take 1 tablet by mouth daily as needed.    No current facility-administered medications on file prior to visit.    Review of Systems Per HPI unless specifically indicated above     Objective:    BP 190/90 mmHg  Pulse 76  Temp(Src) 98 F (36.7 C) (Oral)  Wt 219 lb 12 oz (99.678 kg)  Wt Readings from Last 3 Encounters:  07/04/14 219 lb 12 oz (99.678 kg)  06/17/14 221 lb (  100.245 kg)  05/24/14 223 lb (101.152 kg)    Physical Exam  Constitutional: She appears well-developed and well-nourished. No distress.  HENT:  Mouth/Throat: Oropharynx is clear and moist. No oropharyngeal exudate.  Cardiovascular: Normal rate, regular rhythm and intact distal pulses.   Murmur (3/6 SEM best at LUSB with radiation to carotids) heard. Pulmonary/Chest: Effort normal and breath sounds normal. No respiratory distress. She has no wheezes. She has no rales.  Musculoskeletal: She exhibits no edema.  Skin: Skin is warm and dry. No rash noted.  Psychiatric: She has a normal mood and affect.  Nursing note and vitals  reviewed.  No results found for this or any previous visit.    Assessment & Plan:   Problem List Items Addressed This Visit    Essential hypertension - Primary    Persistently elevated in office - will start amlodipine 2.5mg  daily. I recommended she buy new bp cuff for arm. Again discussed DASH diet.  RTC 1 mo for f/u HTN  Pt agrees with plan.    Relevant Medications      amLODIpine (NORVASC)  tablet       Follow up plan: Return in about 4 weeks (around 08/01/2014), or as needed, for follow up visit.

## 2014-07-04 NOTE — Progress Notes (Signed)
Pre visit review using our clinic review tool, if applicable. No additional management support is needed unless otherwise documented below in the visit note. 

## 2014-07-06 ENCOUNTER — Encounter: Payer: Self-pay | Admitting: *Deleted

## 2014-07-07 ENCOUNTER — Telehealth: Payer: Self-pay

## 2014-07-07 NOTE — Telephone Encounter (Signed)
Sounds like she has had a bad local reaction to shingles shot. Would recommend warm compresses to skin. Could consider NSAID like aleve 220mg  bid with food if she can tolerate these meds. Should resolve over next week.

## 2014-07-07 NOTE — Telephone Encounter (Signed)
Patient notified and verbalized understanding. 

## 2014-07-07 NOTE — Telephone Encounter (Signed)
Pt had shingles vaccine on 07/04/2014 and now has redness, itching, soreness and swelling the size of golf ball. No fever and no SOB. Pt wants to know how long will last and should pt take any type med. Pt request cb.

## 2014-07-12 ENCOUNTER — Encounter: Payer: Self-pay | Admitting: Family Medicine

## 2014-07-28 DIAGNOSIS — E6609 Other obesity due to excess calories: Secondary | ICD-10-CM | POA: Diagnosis not present

## 2014-07-28 DIAGNOSIS — R011 Cardiac murmur, unspecified: Secondary | ICD-10-CM | POA: Diagnosis not present

## 2014-07-28 DIAGNOSIS — E784 Other hyperlipidemia: Secondary | ICD-10-CM | POA: Diagnosis not present

## 2014-07-28 DIAGNOSIS — I10 Essential (primary) hypertension: Secondary | ICD-10-CM | POA: Diagnosis not present

## 2014-08-01 ENCOUNTER — Encounter: Payer: Self-pay | Admitting: Family Medicine

## 2014-08-04 ENCOUNTER — Encounter: Payer: Self-pay | Admitting: Family Medicine

## 2014-08-04 ENCOUNTER — Ambulatory Visit (INDEPENDENT_AMBULATORY_CARE_PROVIDER_SITE_OTHER): Payer: Medicare Other | Admitting: Family Medicine

## 2014-08-04 VITALS — BP 164/80 | HR 64 | Temp 97.8°F | Wt 225.5 lb

## 2014-08-04 DIAGNOSIS — J3089 Other allergic rhinitis: Secondary | ICD-10-CM

## 2014-08-04 DIAGNOSIS — I10 Essential (primary) hypertension: Secondary | ICD-10-CM

## 2014-08-04 DIAGNOSIS — J309 Allergic rhinitis, unspecified: Secondary | ICD-10-CM

## 2014-08-04 NOTE — Assessment & Plan Note (Signed)
Anticipate partly related to decongestant use, as well as some white coat hypertension Continue amlodipine 2.5mg  daily.  Reassess next visit.

## 2014-08-04 NOTE — Progress Notes (Signed)
Pre visit review using our clinic review tool, if applicable. No additional management support is needed unless otherwise documented below in the visit note. 

## 2014-08-04 NOTE — Progress Notes (Signed)
BP 164/80 mmHg  Pulse 64  Temp(Src) 97.8 F (36.6 C) (Oral)  Wt 225 lb 8 oz (102.286 kg)   CC: 1 mo f/u   Subjective:    Patient ID: Monica Wise, female    DOB: 1946-02-06, 69 y.o.   MRN: [id number]  HPI: SHARDE GOVER is a 69 y.o. female presenting on 08/04/2014 for Follow-up   HTN f/u visit- last visit we started amlodipine 2.5mg  daily for persistently elevated blood pressures. She could not afford new arm cuff. No HA, vision changes, CP/tightness, SOB, leg swelling. Denies dizziness or leg swelling.  BP Readings from Last 3 Encounters:  08/04/14 164/80  07/04/14 190/90  06/17/14 196/90  bp 140s/70s at recent cardiology appointment  Chronic nasal congestion - uses nasal saline, singulair, pseudophed (although asked to stop this) and flonase rarely. States only pseudophed works well.  Relevant past medical, surgical, family and social history reviewed and updated as indicated. Interim medical history since our last visit reviewed. Allergies and medications reviewed and updated. Current Outpatient Prescriptions on File Prior to Visit  Medication Sig  . albuterol (PROVENTIL HFA;VENTOLIN HFA) 108 (90 BASE) MCG/ACT inhaler Inhale 1-2 puffs into the lungs every 6 (six) hours as needed for wheezing or shortness of breath.  Marland Kitchen amLODipine (NORVASC) 2.5 MG tablet Take 1 tablet (2.5 mg total) by mouth daily.  Marland Kitchen aspirin 81 MG tablet Take 81 mg by mouth daily.  . budesonide-formoterol (SYMBICORT) 160-4.5 MCG/ACT inhaler Inhale 2 puffs into the lungs 2 (two) times daily.  . cetirizine (ZYRTEC) 10 MG tablet Take 10 mg by mouth daily.  . Cholecalciferol (VITAMIN D-3 PO) Take by mouth.  . ezetimibe (ZETIA) 10 MG tablet Take 1 tablet (10 mg total) by mouth daily.  . famotidine (PEPCID) 20 MG tablet Take 20 mg by mouth daily.  . furosemide (LASIX) 20 MG tablet Take 20 mg by mouth daily as needed for fluid.   . Loperamide HCl (ANTI-DIARRHEAL PO) Take by mouth.  . Misc Natural Products (SUPER ENERGY  HERBAL COMPLEX PO) Take by mouth. GINSANA  . montelukast (SINGULAIR) 10 MG tablet Take 10 mg by mouth at bedtime.   . Multiple Vitamin (MULTIVITAMIN) tablet Take 1 tablet by mouth daily.  . Omega-3 Krill Oil 500 MG CAPS Take 500 mg by mouth daily.  Marland Kitchen omeprazole (PRILOSEC) 20 MG capsule Take 20 mg by mouth daily.   Marland Kitchen oxycodone (OXY-IR) 5 MG capsule Take 5 mg by mouth every 6 (six) hours as needed for pain.  . potassium chloride (K-DUR) 10 MEQ tablet Take 10 mEq by mouth daily as needed. With lasix  . Pseudoephedrine HCl (NASAL DECONGESTANT PO) Take 1 tablet by mouth daily as needed.   . tiotropium (SPIRIVA HANDIHALER) 18 MCG inhalation capsule Place 1 capsule (18 mcg total) into inhaler and inhale daily.  . Glucosamine-Chondroitin (MOVE FREE PO) Take by mouth daily.   No current facility-administered medications on file prior to visit.    Review of Systems Per HPI unless specifically indicated above     Objective:    BP 164/80 mmHg  Pulse 64  Temp(Src) 97.8 F (36.6 C) (Oral)  Wt 225 lb 8 oz (102.286 kg)  Wt Readings from Last 3 Encounters:  08/04/14 225 lb 8 oz (102.286 kg)  07/04/14 219 lb 12 oz (99.678 kg)  06/17/14 221 lb (100.245 kg)    Physical Exam  Constitutional: She appears well-developed and well-nourished. No distress.  HENT:  Right Ear: Hearing, tympanic membrane, external ear and  ear canal normal.  Left Ear: Hearing, tympanic membrane, external ear and ear canal normal.  Nose: Mucosal edema (nasal congestion) and rhinorrhea present.  Mouth/Throat: Uvula is midline, oropharynx is clear and moist and mucous membranes are normal. No oropharyngeal exudate, posterior oropharyngeal edema, posterior oropharyngeal erythema or tonsillar abscesses.  Cardiovascular: Normal rate, regular rhythm, normal heart sounds and intact distal pulses.   No murmur heard. Pulmonary/Chest: Effort normal and breath sounds normal. No respiratory distress. She has no wheezes. She has no rales.   Musculoskeletal: She exhibits no edema.  Skin: Skin is warm and dry. No rash noted.  Nursing note and vitals reviewed.  Results for orders placed or performed in visit on 07/06/14  Lipid panel  Result Value Ref Range   Cholesterol, Total 203    HDL 68 35 - 70 mg/dL   Triglycerides 86    LDL (calc) 118   Comprehensive metabolic panel  Result Value Ref Range   Glucose 110    Creat 0.86    Sodium 143 137 - 147 mmol/L   Bilirubin 0.6    Alkaline Phosphatase 67 U/L   AST 14 U/L   ALT 14   Vit D  25 hydroxy (rtn osteoporosis monitoring)  Result Value Ref Range   Vit D, 25-Hydroxy 34   TSH  Result Value Ref Range   TSH 1.79   CBC  Result Value Ref Range   WBC 5.7    HGB 13.0 g/dL   platelet count 233       Assessment & Plan:  orthostatics negative today. Problem List Items Addressed This Visit    Perennial allergic rhinitis    Again encouraged against decongestant use. Recommend continued nasal saline, and take flonase regularly for 1 week then assess effect. Pt agrees with plan.      Essential hypertension - Primary    Anticipate partly related to decongestant use, as well as some white coat hypertension Continue amlodipine 2.5mg  daily.  Reassess next visit.          Follow up plan: Return in about 3 months (around 11/02/2014), or if symptoms worsen or fail to improve, for follow up visit.

## 2014-08-04 NOTE — Patient Instructions (Signed)
Continue monitoring blood pressures at home and notify me if persistent low #s both with home cuff and with local pharmacy cuff. Continue to back off decongestants - I think this is contributing to elevated blood pressures. Take flonase regularly for 1 week then reassess nasal congestion. Return to see me in 2-3 months for blood pressure follow up, sooner if needed

## 2014-08-04 NOTE — Assessment & Plan Note (Signed)
Again encouraged against decongestant use. Recommend continued nasal saline, and take flonase regularly for 1 week then assess effect. Pt agrees with plan.

## 2014-08-18 DIAGNOSIS — L02828 Furuncle of other sites: Secondary | ICD-10-CM | POA: Diagnosis not present

## 2014-08-18 DIAGNOSIS — L720 Epidermal cyst: Secondary | ICD-10-CM | POA: Diagnosis not present

## 2014-09-10 ENCOUNTER — Other Ambulatory Visit: Payer: Self-pay | Admitting: Family Medicine

## 2014-09-10 DIAGNOSIS — E785 Hyperlipidemia, unspecified: Secondary | ICD-10-CM

## 2014-09-10 DIAGNOSIS — I10 Essential (primary) hypertension: Secondary | ICD-10-CM

## 2014-09-10 DIAGNOSIS — E669 Obesity, unspecified: Secondary | ICD-10-CM

## 2014-09-12 ENCOUNTER — Other Ambulatory Visit (INDEPENDENT_AMBULATORY_CARE_PROVIDER_SITE_OTHER): Payer: Medicare Other

## 2014-09-12 DIAGNOSIS — I10 Essential (primary) hypertension: Secondary | ICD-10-CM | POA: Diagnosis not present

## 2014-09-12 DIAGNOSIS — E785 Hyperlipidemia, unspecified: Secondary | ICD-10-CM

## 2014-09-12 LAB — LIPID PANEL
CHOLESTEROL: 205 mg/dL — AB (ref 0–200)
HDL: 56.7 mg/dL (ref >39.00)
LDL CALC: 124 mg/dL — AB (ref 0–99)
NonHDL: 148.3
TRIGLYCERIDES: 122 mg/dL (ref 0.0–149.0)
Total CHOL/HDL Ratio: 4
VLDL: 24.4 mg/dL (ref 0.0–40.0)

## 2014-09-12 LAB — COMPREHENSIVE METABOLIC PANEL
ALBUMIN: 4.2 g/dL (ref 3.5–5.2)
ALT: 16 U/L (ref 0–35)
AST: 16 U/L (ref 0–37)
Alkaline Phosphatase: 67 U/L (ref 39–117)
BUN: 13 mg/dL (ref 6–23)
CALCIUM: 9.5 mg/dL (ref 8.4–10.5)
CHLORIDE: 105 meq/L (ref 96–112)
CO2: 31 mEq/L (ref 19–32)
CREATININE: 0.96 mg/dL (ref 0.40–1.20)
GFR: 60.98 mL/min (ref >60.00)
Glucose, Bld: 112 mg/dL — ABNORMAL HIGH (ref 70–99)
Potassium: 4 mEq/L (ref 3.5–5.1)
SODIUM: 141 meq/L (ref 135–145)
Total Bilirubin: 0.6 mg/dL (ref 0.2–1.2)
Total Protein: 7 g/dL (ref 6.0–8.3)

## 2014-09-12 LAB — TSH: TSH: 2.64 u[IU]/mL (ref 0.35–4.50)

## 2014-09-19 ENCOUNTER — Ambulatory Visit (INDEPENDENT_AMBULATORY_CARE_PROVIDER_SITE_OTHER): Payer: Medicare Other | Admitting: Family Medicine

## 2014-09-19 ENCOUNTER — Encounter: Payer: Self-pay | Admitting: Family Medicine

## 2014-09-19 VITALS — BP 165/80 | HR 76 | Temp 97.8°F | Ht 65.0 in | Wt 228.8 lb

## 2014-09-19 DIAGNOSIS — Z0000 Encounter for general adult medical examination without abnormal findings: Secondary | ICD-10-CM | POA: Insufficient documentation

## 2014-09-19 DIAGNOSIS — E785 Hyperlipidemia, unspecified: Secondary | ICD-10-CM

## 2014-09-19 DIAGNOSIS — Z7189 Other specified counseling: Secondary | ICD-10-CM | POA: Insufficient documentation

## 2014-09-19 DIAGNOSIS — M170 Bilateral primary osteoarthritis of knee: Secondary | ICD-10-CM

## 2014-09-19 DIAGNOSIS — E669 Obesity, unspecified: Secondary | ICD-10-CM

## 2014-09-19 DIAGNOSIS — J3089 Other allergic rhinitis: Secondary | ICD-10-CM

## 2014-09-19 DIAGNOSIS — J449 Chronic obstructive pulmonary disease, unspecified: Secondary | ICD-10-CM

## 2014-09-19 DIAGNOSIS — E2839 Other primary ovarian failure: Secondary | ICD-10-CM

## 2014-09-19 DIAGNOSIS — I10 Essential (primary) hypertension: Secondary | ICD-10-CM | POA: Diagnosis not present

## 2014-09-19 MED ORDER — AMLODIPINE BESYLATE 5 MG PO TABS: 5.0000 mg | ORAL_TABLET | Freq: Every day | ORAL | Status: DC

## 2014-09-19 NOTE — Progress Notes (Addendum)
BP 165/80 mmHg  Pulse 76  Temp(Src) 97.8 F (36.6 C) (Oral)  Ht 5\' 5"  (1.651 m)  Wt 228 lb 12 oz (103.76 kg)  BMI 38.07 kg/m2   CC: medicare wellness visit  Subjective:    Patient ID: Monica Wise, female    DOB: [birth year], 69 y.o.   MRN: [id number]  HPI: SHAWNDELL Wise is a 69 y.o. female presenting on 09/19/2014 for Annual Exam   HTN - we have started amlodipine 2.5mg  daily as of last few visits. She is also prescribed lasix but does not regularly take this. Pt continues to use phenylephrine despite my recommendation against this. Endorses better control at home (120-140/60-70s). She has multiple intolerances including ramipril and olmesartan.   Known aortic valve sclerosis without stenosis.   Hearing screen - failed L ear. denies trouble - endorses she hears better out of L ear. Vision screen passed No falls in last year Denies depression/anhedonia, sadness  Preventative: COLONOSCOPY Date: 2010 hyperplastic polyps identified. rpt 10 yrs ([facility]) Mammogram 05/2014 birads 2 DEXA - has not undergone - will schedule. Flu shot - 03/2014 Presumed pneumovax 2001, prevnar requests to defer zostavax 07/2014 G1P1 Advanced directive: son is POA. Doesn't have at home. Requests informative packet - provided today  Lives alone, 2 feral cats Occupation: retired, worked for [employer] Edu: [employer] Activity: works in yard, unable to walk 2/2 knee pain Diet: good water, fruits/vegetables daily  Relevant past medical, surgical, family and social history reviewed and updated as indicated. Interim medical history since our last visit reviewed. Allergies and medications reviewed and updated. Current Outpatient Prescriptions on File Prior to Visit  Medication Sig  . albuterol (PROVENTIL HFA;VENTOLIN HFA) 108 (90 BASE) MCG/ACT inhaler Inhale 1-2 puffs into the lungs every 6 (six) hours as needed for wheezing or shortness of breath.  Marland Kitchen aspirin 81 MG tablet Take 81 mg by mouth daily.  .  budesonide-formoterol (SYMBICORT) 160-4.5 MCG/ACT inhaler Inhale 2 puffs into the lungs 2 (two) times daily.  . cetirizine (ZYRTEC) 10 MG tablet Take 10 mg by mouth daily.  . Cholecalciferol (VITAMIN D-3 PO) Take by mouth.  . ezetimibe (ZETIA) 10 MG tablet Take 1 tablet (10 mg total) by mouth daily.  . famotidine (PEPCID) 20 MG tablet Take 20 mg by mouth as needed.   . furosemide (LASIX) 20 MG tablet Take 20 mg by mouth daily as needed for fluid.   . Loperamide HCl (ANTI-DIARRHEAL PO) Take by mouth.  . Misc Natural Products (SUPER ENERGY HERBAL COMPLEX PO) Take by mouth. GINSANA  . montelukast (SINGULAIR) 10 MG tablet Take 10 mg by mouth at bedtime.   . Multiple Vitamin (MULTIVITAMIN) tablet Take 1 tablet by mouth daily.  . Omega-3 Krill Oil 500 MG CAPS Take 500 mg by mouth daily.  Marland Kitchen omeprazole (PRILOSEC) 20 MG capsule Take 20 mg by mouth daily.   Marland Kitchen oxycodone (OXY-IR) 5 MG capsule Take 5 mg by mouth every 6 (six) hours as needed for pain.  . potassium chloride (K-DUR) 10 MEQ tablet Take 10 mEq by mouth daily as needed. With lasix  . tiotropium (SPIRIVA HANDIHALER) 18 MCG inhalation capsule Place 1 capsule (18 mcg total) into inhaler and inhale daily.  . Glucosamine-Chondroitin (MOVE FREE PO) Take by mouth daily.   No current facility-administered medications on file prior to visit.    Review of Systems  Constitutional: Negative for fever, chills, activity change, appetite change, fatigue and unexpected weight change.  HENT: Negative for hearing loss.  Eyes: Negative for visual disturbance.  Respiratory: Negative for cough, chest tightness, shortness of breath and wheezing.   Cardiovascular: Negative for chest pain, palpitations and leg swelling.  Gastrointestinal: Negative for nausea, vomiting, abdominal pain, diarrhea, constipation, blood in stool and abdominal distention.  Genitourinary: Negative for hematuria and difficulty urinating.  Musculoskeletal: Negative for myalgias,  arthralgias and neck pain.  Skin: Negative for rash.  Neurological: Negative for dizziness, seizures, syncope and headaches.  Hematological: Negative for adenopathy. Does not bruise/bleed easily.  Psychiatric/Behavioral: Negative for dysphoric mood. The patient is not nervous/anxious.    Per HPI unless specifically indicated above     Objective:    BP 165/80 mmHg  Pulse 76  Temp(Src) 97.8 F (36.6 C) (Oral)  Ht 5\' 5"  (1.651 m)  Wt 228 lb 12 oz (103.76 kg)  BMI 38.07 kg/m2  Wt Readings from Last 3 Encounters:  09/19/14 228 lb 12 oz (103.76 kg)  08/04/14 225 lb 8 oz (102.286 kg)  07/04/14 219 lb 12 oz (99.678 kg)    Physical Exam  Constitutional: She is oriented to person, place, and time. She appears well-developed and well-nourished. No distress.  Morbidly obese  HENT:  Head: Normocephalic and atraumatic.  Right Ear: Hearing, tympanic membrane, external ear and ear canal normal.  Left Ear: Hearing, tympanic membrane, external ear and ear canal normal.  Nose: Nose normal.  Mouth/Throat: Uvula is midline, oropharynx is clear and moist and mucous membranes are normal. No oropharyngeal exudate, posterior oropharyngeal edema or posterior oropharyngeal erythema.  Eyes: Conjunctivae and EOM are normal. Pupils are equal, round, and reactive to light. No scleral icterus.  Neck: Normal range of motion. Neck supple. Carotid bruit is present (bilat - ?referred from aortic sclerosis murmur). No thyromegaly present.  Cardiovascular: Normal rate, regular rhythm and intact distal pulses.   Murmur (3/6 SM best at LUSB) heard. Pulses:      Radial pulses are 2+ on the right side, and 2+ on the left side.  Pulmonary/Chest: Effort normal and breath sounds normal. No respiratory distress. She has no wheezes. She has no rales.  Breast - deferred (per surgery).  Abdominal: Soft. Bowel sounds are normal. She exhibits no distension and no mass. There is no tenderness. There is no rebound and no  guarding.  Genitourinary:  GYN - declined  Musculoskeletal: Normal range of motion. She exhibits no edema.  Lymphadenopathy:    She has no cervical adenopathy.  Neurological: She is alert and oriented to person, place, and time.  CN grossly intact, station and gait intact Recall 3/3 Calculation 4/5 serial 7s  Skin: Skin is warm and dry. No rash noted.  Psychiatric: She has a normal mood and affect. Her behavior is normal. Judgment and thought content normal.  Nursing note and vitals reviewed.  Results for orders placed or performed in visit on 09/12/14  Lipid panel  Result Value Ref Range   Cholesterol 205 (H) 0 - 200 mg/dL   Triglycerides 122.0 0.0 - 149.0 mg/dL   HDL 56.70 >39.00 mg/dL   VLDL 24.4 0.0 - 40.0 mg/dL   LDL Cholesterol 124 (H) 0 - 99 mg/dL   Total CHOL/HDL Ratio 4    NonHDL 148.30   Comprehensive metabolic panel  Result Value Ref Range   Sodium 141 135 - 145 mEq/L   Potassium 4.0 3.5 - 5.1 mEq/L   Chloride 105 96 - 112 mEq/L   CO2 31 19 - 32 mEq/L   Glucose, Bld 112 (H) 70 - 99 mg/dL  BUN 13 6 - 23 mg/dL   Creatinine, Ser 0.96 0.40 - 1.20 mg/dL   Total Bilirubin 0.6 0.2 - 1.2 mg/dL   Alkaline Phosphatase 67 39 - 117 U/L   AST 16 0 - 37 U/L   ALT 16 0 - 35 U/L   Total Protein 7.0 6.0 - 8.3 g/dL   Albumin 4.2 3.5 - 5.2 g/dL   Calcium 9.5 8.4 - 10.5 mg/dL   GFR 60.98 >60.00 mL/min  TSH  Result Value Ref Range   TSH 2.64 0.35 - 4.50 uIU/mL      Assessment & Plan:   Problem List Items Addressed This Visit    Perennial allergic rhinitis    Pt doing well with intranasal steroid and less decongestant.      Osteoarthritis    Marked at knees.      Obesity    Reviewed healthy diet and lifestyle changes to affect sustainable weight loss. Struggles 2/2 knee OA pain.      Medicare annual wellness visit, initial - Primary    I have personally reviewed the Medicare Annual Wellness questionnaire and have noted 1. The patient's medical and social  history 2. Their use of alcohol, tobacco or illicit drugs 3. Their current medications and supplements 4. The patient's functional ability including ADL's, fall risks, home safety risks and hearing or visual impairment. 5. Diet and physical activity 6. Evidence for depression or mood disorders The patients weight, height, BMI have been recorded in the chart.  Hearing and vision has been addressed. I have made referrals, counseling and provided education to the patient based review of the above and I have provided the pt with a written personalized care plan for preventive services. Provider list updated - see scanned questionairre. Reviewed preventative protocols and updated unless pt declined.       Hyperlipidemia    Reviewed #s and discussed healthy diet changes to affect improvement of #s      Relevant Medications   amLODIpine (NORVASC) tablet   Health maintenance examination    Preventative protocols reviewed and updated unless pt declined. Discussed healthy diet and lifestyle.       Essential hypertension    HTN + component of white-coat HTN. Given elevated on recheck - will ask her to increase amlodipine to 5mg  daily. Reassess next visit in 3 months.      Relevant Medications   amLODIpine (NORVASC) tablet   COPD (chronic obstructive pulmonary disease)    Compliant with triple therapy + singulair. Rare albuterol use.      Advanced care planning/counseling discussion    Advanced directive: son is POA. Doesn't have at home. Requests informative packet - provided today       Other Visit Diagnoses    Estrogen deficiency        Relevant Orders    DG Bone Density        Follow up plan: Return in about 3 months (around 12/20/2014), or as needed, for follow up visit.

## 2014-09-19 NOTE — Progress Notes (Signed)
Pre visit review using our clinic review tool, if applicable. No additional management support is needed unless otherwise documented below in the visit note. 

## 2014-09-19 NOTE — Assessment & Plan Note (Signed)
Preventative protocols reviewed and updated unless pt declined. Discussed healthy diet and lifestyle.  

## 2014-09-19 NOTE — Assessment & Plan Note (Signed)
Advanced directive: son is POA. Doesn't have at home. Requests informative packet - provided today

## 2014-09-19 NOTE — Assessment & Plan Note (Signed)
Reviewed #s and discussed healthy diet changes to affect improvement of #s

## 2014-09-19 NOTE — Assessment & Plan Note (Signed)
Compliant with triple therapy + singulair. Rare albuterol use.

## 2014-09-19 NOTE — Addendum Note (Signed)
Addended by: Ria Bush on: 09/19/2014 04:07 PM   Modules accepted: Miquel Dunn

## 2014-09-19 NOTE — Assessment & Plan Note (Signed)
Marked at knees.

## 2014-09-19 NOTE — Patient Instructions (Addendum)
Pass by Marion's office to schedule bone density scan. Advanced planning packet provided today. Blood pressure is staying a bit elevated - increase amlodipine to 5mg  daily. New dose is at pharmacy (when you run out of two pills daily of current dose). Good to see you today, call us with questions. Return as needed or in 3 months for follow up.

## 2014-09-19 NOTE — Assessment & Plan Note (Signed)

## 2014-09-19 NOTE — Assessment & Plan Note (Signed)
Pt doing well with intranasal steroid and less decongestant.

## 2014-09-19 NOTE — Assessment & Plan Note (Addendum)
HTN + component of white-coat HTN. Given elevated on recheck - will ask her to increase amlodipine to 5mg  daily. Reassess next visit in 3 months.

## 2014-09-19 NOTE — Assessment & Plan Note (Signed)
Reviewed healthy diet and lifestyle changes to affect sustainable weight loss. Struggles 2/2 knee OA pain.

## 2014-09-30 DIAGNOSIS — M8580 Other specified disorders of bone density and structure, unspecified site: Secondary | ICD-10-CM

## 2014-09-30 HISTORY — PX: OTHER SURGICAL HISTORY: SHX169

## 2014-09-30 HISTORY — DX: Other specified disorders of bone density and structure, unspecified site: M85.80

## 2014-10-06 ENCOUNTER — Encounter: Payer: Self-pay | Admitting: Podiatry

## 2014-10-06 ENCOUNTER — Ambulatory Visit (INDEPENDENT_AMBULATORY_CARE_PROVIDER_SITE_OTHER): Payer: Medicare Other | Admitting: Podiatry

## 2014-10-06 VITALS — BP 141/67 | HR 72 | Resp 16 | Ht 65.0 in | Wt 225.0 lb

## 2014-10-06 DIAGNOSIS — M79673 Pain in unspecified foot: Secondary | ICD-10-CM | POA: Diagnosis not present

## 2014-10-06 DIAGNOSIS — L84 Corns and callosities: Secondary | ICD-10-CM | POA: Diagnosis not present

## 2014-10-06 NOTE — Progress Notes (Signed)
   Subjective:    Patient ID: Monica Wise, female    DOB: 13-Aug-1945, 69 y.o.   MRN: [id number]  HPI 69 year old female presents the office today with complaints of painful calluses to the plantar aspect of both of her feet. She says that she periodically tries to cut the areas out herself and she is use callus pads. She denies any redness or drainage on the callus size. She states that she has pain to the area daily with pressure and weightbearing when they become thick as they are today. No other complaints at this time.   Review of Systems  Constitutional: Positive for activity change and fatigue.  HENT: Positive for sinus pressure.   Allergic/Immunologic: Positive for environmental allergies.  Hematological: Bruises/bleeds easily.  All other systems reviewed and are negative.      Objective:   Physical Exam AAO 3, NAD DP/PT pulses palpable, CRT less than 3 seconds Protective sensation intact with Simms Weinstein monofilament, vibratory sensation intact, Achilles tendon remains intact. Hyperkeratotic lesions bilateral submetatarsal 5 and right submetatarsal 2. There is tenderness to palpation overlying these lesions. Upon debridement underlying skin is intact and there is no underlying ulceration, drainage, or other clinical signs of infection. No open lesions or other pre-ulcerative lesions. No interdigital maceration. No other areas of tenderness to bilateral lower extremities. No overlying edema, erythema, increase in warmth bilaterally. MMT 5/5, ROM WNL No pain with calf compression, swelling, warmth, erythema.     Assessment & Plan:  69 year old female with symptomatic hyperkerotic lesions x 3.  -Treatment options were discussed include alternatives, risks, complications -Lesion sharply debrided 3 without complications/bleeding -Dispensed offloading pads -Follow-up as needed. In the meantime occur should call the office with any questions, concerns, change in symptoms.

## 2014-10-18 DIAGNOSIS — E2839 Other primary ovarian failure: Secondary | ICD-10-CM | POA: Diagnosis not present

## 2014-10-18 DIAGNOSIS — M8588 Other specified disorders of bone density and structure, other site: Secondary | ICD-10-CM | POA: Diagnosis not present

## 2014-10-18 DIAGNOSIS — M8580 Other specified disorders of bone density and structure, unspecified site: Secondary | ICD-10-CM | POA: Diagnosis not present

## 2014-10-19 ENCOUNTER — Encounter: Payer: Self-pay | Admitting: Family Medicine

## 2014-10-20 ENCOUNTER — Encounter: Payer: Self-pay | Admitting: *Deleted

## 2014-10-20 ENCOUNTER — Encounter: Payer: Self-pay | Admitting: Family Medicine

## 2014-10-21 NOTE — Op Note (Signed)
PATIENT NAME:  Monica Wise, Monica Wise MR#:  [id number] DATE OF BIRTH:  08-17-1945  DATE OF PROCEDURE:  04/23/2013  PREOPERATIVE DIAGNOSIS: Medial meniscus tear with medial compartment degenerative arthritis, left knee.   POSTOPERATIVE DIAGNOSES: 1.  Posterior horn medial meniscus tear, left knee.  2.  Moderate degenerative arthritis, medial compartment, left knee.  3.  Tear anterior horn lateral meniscus, left knee.  4.  Two loose bodies anterior compartment, left knee.   PROCEDURES: 1.  Arthroscopic partial medial meniscectomy.  2.  Arthroscopic partial lateral meniscectomy.  3.  Removal of two loose bodies, left knee.   SURGEON: Christophe Louis, M.D.   ANESTHESIA: General.   COMPLICATIONS: None.   DESCRIPTION OF PROCEDURE: After adequate induction of general anesthesia, the left lower extremity is secured in the legholder in the usual manner for arthroscopy. The left knee and leg are thoroughly prepped out with alcohol and ChloraPrep and draped in standard sterile fashion. The joint is first infiltrated with 10 mL of Marcaine with epinephrine. Diagnostic arthroscopy is then performed. There is moderate synovial hypertrophy in the suprapatellar pouch. There is a bone spur coming off of the anteromedial proximal articular surface of the patellofemoral articulation. There is mild chondromalacia of the posterior aspect of the patella. In the anterior compartment, there is a large amount of hypertrophic synovium and this is fully resected with the full radial resector and the 50 degree ArthroWand. In the medial compartment there is moderate chondromalacia of the medial femoral condyle. There is a complex tear of the posterior horn of the medial meniscus. This is resected back to a stable rim using a combination of the 3.5 full radial resector and the ArthroWand. In the intercondylar notch, there are two cartilaginous loose bodies and these are removed with the rongeur. In the lateral compartment, the  articular surfaces are normal. There is a radial tear of the anterior horn of the lateral meniscus and this is resected back to a stable rim using the full radial resector. The joint is thoroughly irrigated multiple times. Portals are closed with 4-0 nylon. The joint is infiltrated with 10 mL of Marcaine with epinephrine and 4 mg of morphine and 1 mL of Depo-Medrol. A soft bulky dressing is applied. The patient is returned to the recovery room in satisfactory condition having tolerated the procedure quite well.       ____________________________ Lucas Mallow, MD ces:cc D: 04/25/2013 10:52:35 ET T: 04/25/2013 20:56:13 ET JOB#: [id number]  cc: Lucas Mallow, MD, <Dictator> Lucas Mallow MD ELECTRONICALLY SIGNED 04/28/2013 8:08

## 2014-11-15 ENCOUNTER — Ambulatory Visit: Payer: Self-pay | Admitting: Family Medicine

## 2014-11-23 ENCOUNTER — Other Ambulatory Visit: Payer: Self-pay | Admitting: Family Medicine

## 2015-01-16 DIAGNOSIS — E784 Other hyperlipidemia: Secondary | ICD-10-CM | POA: Diagnosis not present

## 2015-01-16 DIAGNOSIS — E6609 Other obesity due to excess calories: Secondary | ICD-10-CM | POA: Diagnosis not present

## 2015-01-16 DIAGNOSIS — I10 Essential (primary) hypertension: Secondary | ICD-10-CM | POA: Diagnosis not present

## 2015-05-22 ENCOUNTER — Encounter: Payer: Self-pay | Admitting: General Surgery

## 2015-05-22 DIAGNOSIS — R928 Other abnormal and inconclusive findings on diagnostic imaging of breast: Secondary | ICD-10-CM | POA: Diagnosis not present

## 2015-05-22 DIAGNOSIS — Z1231 Encounter for screening mammogram for malignant neoplasm of breast: Secondary | ICD-10-CM | POA: Diagnosis not present

## 2015-05-29 ENCOUNTER — Ambulatory Visit: Payer: Medicare Other | Admitting: General Surgery

## 2015-06-02 DIAGNOSIS — R928 Other abnormal and inconclusive findings on diagnostic imaging of breast: Secondary | ICD-10-CM | POA: Diagnosis not present

## 2015-06-02 DIAGNOSIS — N6459 Other signs and symptoms in breast: Secondary | ICD-10-CM | POA: Diagnosis not present

## 2015-06-05 ENCOUNTER — Encounter: Payer: Self-pay | Admitting: General Surgery

## 2015-06-06 ENCOUNTER — Ambulatory Visit (INDEPENDENT_AMBULATORY_CARE_PROVIDER_SITE_OTHER): Payer: Medicare Other | Admitting: General Surgery

## 2015-06-06 VITALS — BP 130/78 | HR 76 | Resp 12 | Ht 67.0 in | Wt 221.0 lb

## 2015-06-06 DIAGNOSIS — Z1239 Encounter for other screening for malignant neoplasm of breast: Secondary | ICD-10-CM

## 2015-06-06 NOTE — Progress Notes (Signed)
Patient ID: Monica Wise, female   DOB: 07/15/1945, 69 y.o.   MRN: [id number]  Chief Complaint  Patient presents with  . Follow-up    mammogram    HPI Monica Wise is a 69 y.o. female who presents for a breast evaluation. The most recent mammogram was done on 05/22/15 and added views on 1202/16. The patient was stressed by being called back for additional views. Patient does perform regular self breast checks and gets regular mammograms done.    I personally reviewed the patient's clinical history. HPI  Past Medical History  Diagnosis Date  . Perennial allergic rhinitis   . Asthma   . Ex-smoker   . Chronic diarrhea 2011    worse in am, ?IBS  . COPD (chronic obstructive pulmonary disease) 2005    chronic bronchitis - spirometry FVC 70%, FEV1 72%, abnormal loop volume (unknown date)  . Osteoarthritis     knees s/p surgeries  . Lump or mass in breast 2012    right breast US guided finesse 1 o'clock  . Obesity   . Hyperlipidemia   . GERD (gastroesophageal reflux disease)   . Aortic valve sclerosis 2014    withoout stenosis, mild aortic insufficiency - Dr Ubaldo Glassing  . History of measles   . History of chicken pox   . IBS (irritable bowel syndrome)   . Lumbago   . Essential hypertension   . Osteopenia 09/2014    mild by DEXA (-1.2 femur neck)    Past Surgical History  Procedure Laterality Date  . Breast lumpectomy Left 1995    benign mass  . Knee surgery Right 2010    arthroscopic  . Colonoscopy  2010    hyperplastic polyps identified. rpt 10 yrs ([facility])  . Breast biopsy Right 2012  . Knee surgery Left 2014  . Esophagogastroduodenoscopy  2010    Venson Ferencz  . Cardiovascular stress test  12/2004    nl myoview, preserved LV fxn with EF 71% without reversible ischemia ([facility])  . Dexa  09/2014    T -1.2 femur neck    Family History  Problem Relation Age of Onset  . Diverticulitis Mother   . Cancer Maternal Aunt     bone, and unsure  . Heart failure Mother   . CAD Father 66    MI x3  . Sudden death Brother 26    Social History Social History  Substance Use Topics  . Smoking status: Former Smoker -- 1.00 packs/day for 30 years    Types: Cigarettes    Quit date: 05/31/2008  . Smokeless tobacco: Never Used  . Alcohol Use: 0.0 oz/week    0 Standard drinks or equivalent per week     Comment: ocassionally    Allergies  Allergen Reactions  . Altace [Ramipril]     unknown  . Benicar [Olmesartan]     unknown  . Codeine Nausea And Vomiting  . Demerol [Meperidine] Nausea And Vomiting  . Statins Other (See Comments)    Myalgias to crestor, refused further statins  . Sulfa Antibiotics     Unknown reaction per patient    Current Outpatient Prescriptions  Medication Sig Dispense Refill  . albuterol (PROVENTIL HFA;VENTOLIN HFA) 108 (90 BASE) MCG/ACT inhaler Inhale 1-2 puffs into the lungs every 6 (six) hours as needed for wheezing or shortness of breath.    Marland Kitchen amLODipine (NORVASC) 5 MG tablet Take 1 tablet (5 mg total) by mouth daily. 30 tablet 11  . aspirin 81 MG tablet Take  81 mg by mouth as needed.     . budesonide-formoterol (SYMBICORT) 160-4.5 MCG/ACT inhaler Inhale 2 puffs into the lungs 2 (two) times daily. 1 Inhaler 11  . cetirizine (ZYRTEC) 10 MG tablet Take 10 mg by mouth as needed.     . Cholecalciferol (VITAMIN D-3 PO) Take by mouth.    . ezetimibe (ZETIA) 10 MG tablet Take 1 tablet (10 mg total) by mouth daily. 30 tablet 11  . famotidine (PEPCID) 20 MG tablet Take 20 mg by mouth as needed.     . furosemide (LASIX) 20 MG tablet Take 20 mg by mouth daily as needed for fluid.     . Glucosamine-Chondroitin (MOVE FREE PO) Take by mouth daily.    . Loperamide HCl (ANTI-DIARRHEAL PO) Take by mouth.    . montelukast (SINGULAIR) 10 MG tablet TAKE 1 TABLET BY MOUTH EVERY DAY IN THE EVENING 30 tablet 6  . Multiple Vitamin (MULTIVITAMIN) tablet Take 1 tablet by mouth daily.    . Omega-3 Krill Oil 500 MG CAPS Take 500 mg by mouth daily.    Marland Kitchen omeprazole  (PRILOSEC) 20 MG capsule Take 20 mg by mouth as needed.     Marland Kitchen OVER THE COUNTER MEDICATION daily. Ginsana    . oxycodone (OXY-IR) 5 MG capsule Take 5 mg by mouth every 6 (six) hours as needed for pain.    . potassium chloride (K-DUR) 10 MEQ tablet Take 10 mEq by mouth daily as needed. With lasix    . tiotropium (SPIRIVA HANDIHALER) 18 MCG inhalation capsule Place 1 capsule (18 mcg total) into inhaler and inhale daily. 30 capsule 11   No current facility-administered medications for this visit.    Review of Systems Review of Systems  Constitutional: Negative.   Respiratory: Negative.   Cardiovascular: Negative.     Blood pressure 130/78, pulse 76, resp. rate 12, height 5\' 7"  (1.702 m), weight 221 lb (100.245 kg).  Physical Exam Physical Exam  Constitutional: She is oriented to person, place, and time. She appears well-developed and well-nourished.  Eyes: Conjunctivae are normal. No scleral icterus.  Neck: Neck supple.  Cardiovascular: Normal rate and regular rhythm.   Murmur heard.  Systolic murmur is present with a grade of 2/6  Pulmonary/Chest: Effort normal and breath sounds normal. Right breast exhibits no inverted nipple, no mass, no nipple discharge, no skin change and no tenderness. Left breast exhibits no inverted nipple, no mass, no nipple discharge, no skin change and no tenderness.  Lymphadenopathy:    She has no cervical adenopathy.    She has no axillary adenopathy.  Neurological: She is alert and oriented to person, place, and time.  Skin: Skin is warm and dry.    Data Reviewed Screening and diagnostic mammograms of November, December 2016 were reviewed. BI-RADS-1.  Assessment    Benign breast exam.    Plan         Patient has asked to be able to return to the office in one year with a bilateral screening mammogram.  Robert Bellow 06/07/2015, 5:17 PM

## 2015-06-06 NOTE — Patient Instructions (Signed)
Patient will be asked to return to the office in one year with a bilateral screening mammogram. 

## 2015-06-21 ENCOUNTER — Other Ambulatory Visit: Payer: Self-pay | Admitting: Family Medicine

## 2015-06-22 ENCOUNTER — Other Ambulatory Visit: Payer: Self-pay | Admitting: Family Medicine

## 2015-07-07 ENCOUNTER — Other Ambulatory Visit: Payer: Self-pay | Admitting: Family Medicine

## 2015-07-31 ENCOUNTER — Encounter: Payer: Self-pay | Admitting: Family Medicine

## 2015-07-31 ENCOUNTER — Ambulatory Visit (INDEPENDENT_AMBULATORY_CARE_PROVIDER_SITE_OTHER): Payer: Medicare Other | Admitting: Family Medicine

## 2015-07-31 VITALS — BP 138/70 | HR 72 | Temp 97.9°F | Wt 223.1 lb

## 2015-07-31 DIAGNOSIS — J029 Acute pharyngitis, unspecified: Secondary | ICD-10-CM

## 2015-07-31 DIAGNOSIS — J028 Acute pharyngitis due to other specified organisms: Principal | ICD-10-CM

## 2015-07-31 DIAGNOSIS — B9789 Other viral agents as the cause of diseases classified elsewhere: Principal | ICD-10-CM

## 2015-07-31 MED ORDER — AZELASTINE-FLUTICASONE 137-50 MCG/ACT NA SUSP: 1.0000 | Freq: Two times a day (BID) | NASAL | Status: DC

## 2015-07-31 NOTE — Progress Notes (Signed)
BP 138/70 mmHg  Pulse 72  Temp(Src) 97.9 F (36.6 C) (Oral)  Wt 223 lb 1.9 oz (101.207 kg)  SpO2 97%   CC: ST  Subjective:    Patient ID: Monica Wise, female    DOB: Dec 21, 1945, 70 y.o.   MRN: [id number]  HPI: Monica Wise is a 70 y.o. female presenting on 07/31/2015 for Sinusitis   1d h/o ST, L neck ache, congested, PNDrainage, mild tooth pain. Mild cough and headache.   Flonase is not helpful. Ibuprofen 600mg  didn't help. Interested in trial dymista.  No fevers/chills, ear or tooth pain.  No sick contacts at home. No smokers at home.  + asthma and COPD history.   Known aortic valve sclerosis without stenosis  Relevant past medical, surgical, family and social history reviewed and updated as indicated. Interim medical history since our last visit reviewed. Allergies and medications reviewed and updated. Current Outpatient Prescriptions on File Prior to Visit  Medication Sig  . albuterol (PROVENTIL HFA;VENTOLIN HFA) 108 (90 BASE) MCG/ACT inhaler Inhale 1-2 puffs into the lungs every 6 (six) hours as needed for wheezing or shortness of breath.  Marland Kitchen amLODipine (NORVASC) 5 MG tablet Take 1 tablet (5 mg total) by mouth daily.  Marland Kitchen aspirin 81 MG tablet Take 81 mg by mouth as needed.   . cetirizine (ZYRTEC) 10 MG tablet Take 10 mg by mouth as needed.   . Cholecalciferol (VITAMIN D-3 PO) Take by mouth.  . ezetimibe (ZETIA) 10 MG tablet Take 1 tablet (10 mg total) by mouth daily. MUST SCHEDULE ANNUAL WELLNESS EXAM  . famotidine (PEPCID) 20 MG tablet Take 20 mg by mouth as needed.   . furosemide (LASIX) 20 MG tablet Take 20 mg by mouth daily as needed for fluid.   . Glucosamine-Chondroitin (MOVE FREE PO) Take by mouth daily.  . Loperamide HCl (ANTI-DIARRHEAL PO) Take by mouth.  . montelukast (SINGULAIR) 10 MG tablet TAKE 1 TABLET BY MOUTH EVERY DAY IN THE EVENING  . Multiple Vitamin (MULTIVITAMIN) tablet Take 1 tablet by mouth daily.  . Omega-3 Krill Oil 500 MG CAPS Take 500 mg by mouth  daily.  Marland Kitchen OVER THE COUNTER MEDICATION daily. Ginsana  . oxycodone (OXY-IR) 5 MG capsule Take 5 mg by mouth every 6 (six) hours as needed for pain.  . potassium chloride (K-DUR) 10 MEQ tablet Take 10 mEq by mouth daily as needed. With lasix  . SYMBICORT 160-4.5 MCG/ACT inhaler TAKE 2 PUFFS TWICE A DAY  . tiotropium (SPIRIVA HANDIHALER) 18 MCG inhalation capsule Place 1 capsule (18 mcg total) into inhaler and inhale daily.  Marland Kitchen omeprazole (PRILOSEC) 20 MG capsule Take 20 mg by mouth as needed. Reported on 07/31/2015   No current facility-administered medications on file prior to visit.    Review of Systems Per HPI unless specifically indicated in ROS section     Objective:    BP 138/70 mmHg  Pulse 72  Temp(Src) 97.9 F (36.6 C) (Oral)  Wt 223 lb 1.9 oz (101.207 kg)  SpO2 97%  Wt Readings from Last 3 Encounters:  07/31/15 223 lb 1.9 oz (101.207 kg)  06/06/15 221 lb (100.245 kg)  10/06/14 225 lb (102.059 kg)    Physical Exam  Constitutional: She appears well-developed and well-nourished. No distress.  Congested appearing  HENT:  Head: Normocephalic and atraumatic.  Right Ear: Hearing, tympanic membrane, external ear and ear canal normal.  Left Ear: Hearing, tympanic membrane, external ear and ear canal normal.  Nose: Mucosal edema present. No  rhinorrhea. Right sinus exhibits maxillary sinus tenderness and frontal sinus tenderness. Left sinus exhibits maxillary sinus tenderness and frontal sinus tenderness.  Mouth/Throat: Uvula is midline and mucous membranes are normal. Posterior oropharyngeal erythema (mild) present. No oropharyngeal exudate, posterior oropharyngeal edema or tonsillar abscesses.  Eyes: Conjunctivae and EOM are normal. Pupils are equal, round, and reactive to light. No scleral icterus.  Neck: Normal range of motion. Neck supple. No thyromegaly present.  Cardiovascular: Normal rate, regular rhythm, normal heart sounds and intact distal pulses.   No murmur  heard. Pulmonary/Chest: Effort normal and breath sounds normal. No respiratory distress. She has no wheezes. She has no rales.  Lymphadenopathy:    She has no cervical adenopathy.  Skin: Skin is warm and dry. No rash noted.  Nursing note and vitals reviewed.     Assessment & Plan:   Problem List Items Addressed This Visit    Acute viral pharyngitis - Primary    Exam consistent with viral pharyngitis - treat with supportive care per patient instructions provided today, discussed red flags to return for f/u. Pt agrees with plan.          Follow up plan: Return if symptoms worsen or fail to improve, for follow up visit.

## 2015-07-31 NOTE — Assessment & Plan Note (Signed)
Exam consistent with viral pharyngitis - treat with supportive care per patient instructions provided today, discussed red flags to return for f/u. Pt agrees with plan.

## 2015-07-31 NOTE — Progress Notes (Signed)
Pre visit review using our clinic review tool, if applicable. No additional management support is needed unless otherwise documented below in the visit note. 

## 2015-07-31 NOTE — Patient Instructions (Addendum)
You have acute pharyngitis, viral. Lungs sound ok today.  Push fluids and plenty of rest. May use ibuprofen 600mg  twice a day with food for 3 days for throat inflammation. Trial dymista for congestion.  Suck on cold things like popsicles or warm things like herbal teas (whichever soothes the throat better). Salt water gargles.  Return if fever >101.5, ongoing or worsening symptoms past 7-10 days, or trouble opening/closing mouth - let us know right away if that happens. Good to see you today, call clinic with questions.

## 2015-08-07 ENCOUNTER — Telehealth: Payer: Self-pay

## 2015-08-07 MED ORDER — FLUCONAZOLE 150 MG PO TABS
150.0000 mg | ORAL_TABLET | Freq: Once | ORAL | Status: DC
Start: 2015-08-07 — End: 2015-08-25

## 2015-08-07 MED ORDER — AZITHROMYCIN 250 MG PO TABS: ORAL_TABLET | ORAL | Status: DC

## 2015-08-07 MED ORDER — HYDROCOD POLST-CPM POLST ER 10-8 MG/5ML PO SUER: 5.0000 mL | Freq: Every evening | ORAL | Status: DC | PRN

## 2015-08-07 NOTE — Telephone Encounter (Signed)
Patient notified. She said she tolerates the zpack fine, but always needs 2. She requests diflucan to go along with it. Also requests a cough medicine because her cough is keeping her from sleeping and she is coughing all day and night to the point her chest and back hurt from it. CDN makes her nauseated.

## 2015-08-07 NOTE — Telephone Encounter (Signed)
Given progressive symptoms will treat with antibiotic to cover bacterial infection. zpack sent to pharmacy - does she tolerate this abx ok? Update [place] with effect.

## 2015-08-07 NOTE — Telephone Encounter (Signed)
Update [place] after azithromycin course. Diflucan sent in. Hydrocodone cough syrup printed out

## 2015-08-07 NOTE — Telephone Encounter (Signed)
Pt left v/m; pt was seen 07/31/15 and non prod cough has worsened since 08/03/15; pt has chest congestion and chest feels sore, no fever and S/T is slightly better but still hurts. Pt usually has some SOB due to COPD; pt not sure if wheezing. Pt request med sent to [place]. Pt request cb.

## 2015-08-08 ENCOUNTER — Telehealth: Payer: Self-pay

## 2015-08-08 MED ORDER — BENZONATATE 100 MG PO CAPS: 100.0000 mg | ORAL_CAPSULE | Freq: Two times a day (BID) | ORAL | Status: DC | PRN

## 2015-08-08 NOTE — Telephone Encounter (Signed)
Pt is at pharmacy now picking up tussionex; pt needs med to take during the day; not just at night; pt has taken tessalon perle before but this time pt said the tessalon perle has not helped cough as much during the day. Pt wants to know if can take tussionex during the day as well. Pt request cb ASAP. [facility].

## 2015-08-08 NOTE — Telephone Encounter (Signed)
Refilled tessalon perls .

## 2015-08-08 NOTE — Telephone Encounter (Signed)
Message left advising patient and Rx placed up front for pick up. 

## 2015-08-08 NOTE — Telephone Encounter (Signed)
Spoke with Dr. Darnell Level and he said she could take it to a MAX of BID. Called patient and she said tussionex was $100 and she couldn't afford that. She was asking for tessalon perles to be refilled because she realized hers were expired and thinks that may be why they didn't work as well. She is going to try Delsym OTC for her cough as well.

## 2015-08-25 ENCOUNTER — Ambulatory Visit (INDEPENDENT_AMBULATORY_CARE_PROVIDER_SITE_OTHER): Payer: Medicare Other | Admitting: Primary Care

## 2015-08-25 ENCOUNTER — Encounter: Payer: Self-pay | Admitting: Primary Care

## 2015-08-25 VITALS — BP 152/72 | HR 73 | Temp 98.0°F | Ht 67.0 in | Wt 223.0 lb

## 2015-08-25 DIAGNOSIS — R059 Cough, unspecified: Secondary | ICD-10-CM

## 2015-08-25 DIAGNOSIS — T3695XA Adverse effect of unspecified systemic antibiotic, initial encounter: Secondary | ICD-10-CM

## 2015-08-25 DIAGNOSIS — B379 Candidiasis, unspecified: Secondary | ICD-10-CM

## 2015-08-25 DIAGNOSIS — J449 Chronic obstructive pulmonary disease, unspecified: Secondary | ICD-10-CM

## 2015-08-25 DIAGNOSIS — R05 Cough: Secondary | ICD-10-CM | POA: Diagnosis not present

## 2015-08-25 MED ORDER — FLUCONAZOLE 150 MG PO TABS: 150.0000 mg | ORAL_TABLET | Freq: Once | ORAL | Status: DC

## 2015-08-25 MED ORDER — PREDNISONE 10 MG PO TABS: ORAL_TABLET | ORAL | Status: DC

## 2015-08-25 MED ORDER — DOXYCYCLINE HYCLATE 100 MG PO TABS: 100.0000 mg | ORAL_TABLET | Freq: Two times a day (BID) | ORAL | Status: DC

## 2015-08-25 NOTE — Assessment & Plan Note (Signed)
?   Exacerbation with bacterial involvement. Treated 2 weeks ago with zpak, temporary improvement, now worse. Exam with mild rhonchi throughout, but no purulent sputum production, could be bacterial. Will cover with Doxycycline and prednisone taper. Continue albuterol inhaler Return precautions provided.

## 2015-08-25 NOTE — Progress Notes (Signed)
Subjective:    Patient ID: Monica Wise, female    DOB: 1945-08-29, 70 y.o.   MRN: [id number]  HPI  Ms. Varian is a 70 year old female with a history of COPD who presents today with a chief complaint of cough. She also reports nasal congestion, fatigue, weakness, and headaches. Denies fevers. She was evaluated on 01/30, provided with a Zpak on 08/07/15. She started to feel slightly improved the following week, but not 100%. Over the last week she's began to feel worse again. She reports that Zpaks do not work for her typically. She feels about the same as she did during her first evaluation on 07/31/15. Her cough is non productive, denies sick contacts.  Review of Systems  Constitutional: Positive for fatigue. Negative for fever and chills.  HENT: Positive for congestion and sore throat.   Respiratory: Positive for cough, shortness of breath and wheezing.   Musculoskeletal: Negative for myalgias.       Past Medical History  Diagnosis Date  . Perennial allergic rhinitis   . Asthma   . Ex-smoker   . Chronic diarrhea 2011    worse in am, ?IBS  . COPD (chronic obstructive pulmonary disease) ([facility]) 2005    chronic bronchitis - spirometry FVC 70%, FEV1 72%, abnormal loop volume (unknown date)  . Osteoarthritis     knees s/p surgeries  . Lump or mass in breast 2012    right breast US guided finesse 1 o'clock  . Obesity   . Hyperlipidemia   . GERD (gastroesophageal reflux disease)   . Aortic valve sclerosis 2014    withoout stenosis, mild aortic insufficiency - Dr Ubaldo Glassing  . History of measles   . History of chicken pox   . IBS (irritable bowel syndrome)   . Lumbago   . Essential hypertension   . Osteopenia 09/2014    mild by DEXA (-1.2 femur neck)    Social History   Social History  . Marital Status: Divorced    Spouse Name: N/A  . Number of Children: N/A  . Years of Education: N/A   Occupational History  . Not on file.   Social History Main Topics  . Smoking status: Former  Smoker -- 1.00 packs/day for 30 years    Types: Cigarettes    Quit date: 05/31/2008  . Smokeless tobacco: Never Used  . Alcohol Use: 0.0 oz/week    0 Standard drinks or equivalent per week     Comment: ocassionally  . Drug Use: No  . Sexual Activity: Not on file   Other Topics Concern  . Not on file   Social History Narrative   Lives alone, 2 feral cats   Occupation: retired, worked for [employer]   Edu: [employer]   Activity: works in yard, unable to walk 2/2 knee pain   Diet: good water, fruits/vegetables daily    Past Surgical History  Procedure Laterality Date  . Breast lumpectomy Left 1995    benign mass  . Knee surgery Right 2010    arthroscopic  . Colonoscopy  2010    hyperplastic polyps identified. rpt 10 yrs ([facility])  . Breast biopsy Right 2012  . Knee surgery Left 2014  . Esophagogastroduodenoscopy  2010    [facility]  . Cardiovascular stress test  12/2004    nl myoview, preserved LV fxn with EF 71% without reversible ischemia ([facility])  . Dexa  09/2014    T -1.2 femur neck    Family History  Problem Relation  Age of Onset  . Diverticulitis Mother   . Cancer Maternal Aunt     bone, and unsure  . Heart failure Mother   . CAD Father 62    MI x3  . Sudden death Brother 70    Allergies  Allergen Reactions  . Altace [Ramipril]     unknown  . Benicar [Olmesartan]     unknown  . Codeine Nausea And Vomiting  . Demerol [Meperidine] Nausea And Vomiting  . Statins Other (See Comments)    Myalgias to crestor, refused further statins  . Sulfa Antibiotics     Unknown reaction per patient    Current Outpatient Prescriptions on File Prior to Visit  Medication Sig Dispense Refill  . albuterol (PROVENTIL HFA;VENTOLIN HFA) 108 (90 BASE) MCG/ACT inhaler Inhale 1-2 puffs into the lungs every 6 (six) hours as needed for wheezing or shortness of breath.    Marland Kitchen amLODipine (NORVASC) 5 MG tablet Take 1 tablet (5 mg total) by mouth daily. 30 tablet 11  . aspirin 81 MG tablet  Take 81 mg by mouth as needed.     . Azelastine-Fluticasone 137-50 MCG/ACT SUSP Place 1 spray into the nose 2 (two) times daily. 23 g 3  . benzonatate (TESSALON) 100 MG capsule Take 1 capsule (100 mg total) by mouth 2 (two) times daily as needed for cough. 30 capsule 0  . cetirizine (ZYRTEC) 10 MG tablet Take 10 mg by mouth as needed.     . Cholecalciferol (VITAMIN D-3 PO) Take by mouth.    . ezetimibe (ZETIA) 10 MG tablet Take 1 tablet (10 mg total) by mouth daily. MUST SCHEDULE ANNUAL WELLNESS EXAM 30 tablet 3  . famotidine (PEPCID) 20 MG tablet Take 20 mg by mouth as needed.     . furosemide (LASIX) 20 MG tablet Take 20 mg by mouth daily as needed for fluid.     . Glucosamine-Chondroitin (MOVE FREE PO) Take by mouth daily.    . Loperamide HCl (ANTI-DIARRHEAL PO) Take by mouth.    . montelukast (SINGULAIR) 10 MG tablet TAKE 1 TABLET BY MOUTH EVERY DAY IN THE EVENING 30 tablet 6  . Multiple Vitamin (MULTIVITAMIN) tablet Take 1 tablet by mouth daily.    . Omega-3 Krill Oil 500 MG CAPS Take 500 mg by mouth daily.    Marland Kitchen omeprazole (PRILOSEC) 20 MG capsule Take 20 mg by mouth as needed. Reported on 07/31/2015    . OVER THE COUNTER MEDICATION daily. Ginsana    . oxycodone (OXY-IR) 5 MG capsule Take 5 mg by mouth every 6 (six) hours as needed for pain.    . potassium chloride (K-DUR) 10 MEQ tablet Take 10 mEq by mouth daily as needed. With lasix    . SYMBICORT 160-4.5 MCG/ACT inhaler TAKE 2 PUFFS TWICE A DAY 10.2 Inhaler 3  . tiotropium (SPIRIVA HANDIHALER) 18 MCG inhalation capsule Place 1 capsule (18 mcg total) into inhaler and inhale daily. 30 capsule 11   No current facility-administered medications on file prior to visit.    BP 152/72 mmHg  Pulse 73  Temp(Src) 98 F (36.7 C) (Oral)  Ht 5\' 7"  (1.702 m)  Wt 223 lb (101.152 kg)  BMI 34.92 kg/m2  SpO2 94%     Objective:   Physical Exam  Constitutional: She appears well-nourished.  HENT:  Right Ear: Tympanic membrane and ear canal  normal.  Left Ear: Tympanic membrane and ear canal normal.  Nose: Right sinus exhibits no maxillary sinus tenderness and no frontal sinus  tenderness. Left sinus exhibits no maxillary sinus tenderness and no frontal sinus tenderness.  Mouth/Throat: Oropharynx is clear and moist.  Neck: Neck supple.  Cardiovascular: Normal rate and regular rhythm.   Pulmonary/Chest: She has no wheezes.  Mild rhonchi throughout, not sure of her baseline, could be ? Bacterial process  Lymphadenopathy:    She has no cervical adenopathy.  Skin: Skin is warm and dry.          Assessment & Plan:

## 2015-08-25 NOTE — Progress Notes (Signed)
Pre visit review using our clinic review tool, if applicable. No additional management support is needed unless otherwise documented below in the visit note. 

## 2015-08-25 NOTE — Patient Instructions (Signed)
Start Doxycycline antibiotic. Take 1 tablet by mouth twice daily for 10 days.  Start prednisone tablets for shortness of breath. Take 3 tablets for 2 days, then 2 tablets for 2 days, then 1 tablet for 2 days.  Increase consumption of fluids and rest.  It was a pleasure meeting you!

## 2015-09-16 ENCOUNTER — Other Ambulatory Visit: Payer: Self-pay | Admitting: Family Medicine

## 2015-09-16 DIAGNOSIS — Z1159 Encounter for screening for other viral diseases: Secondary | ICD-10-CM

## 2015-09-16 DIAGNOSIS — E785 Hyperlipidemia, unspecified: Secondary | ICD-10-CM

## 2015-09-18 ENCOUNTER — Other Ambulatory Visit (INDEPENDENT_AMBULATORY_CARE_PROVIDER_SITE_OTHER): Payer: Medicare Other

## 2015-09-18 ENCOUNTER — Other Ambulatory Visit: Payer: Self-pay | Admitting: Family Medicine

## 2015-09-18 DIAGNOSIS — M2550 Pain in unspecified joint: Secondary | ICD-10-CM | POA: Diagnosis not present

## 2015-09-18 DIAGNOSIS — Z1159 Encounter for screening for other viral diseases: Secondary | ICD-10-CM

## 2015-09-18 DIAGNOSIS — E785 Hyperlipidemia, unspecified: Secondary | ICD-10-CM

## 2015-09-18 LAB — LIPID PANEL
CHOL/HDL RATIO: 3
Cholesterol: 219 mg/dL — ABNORMAL HIGH (ref 0–200)
HDL: 66.5 mg/dL (ref >39.00)
LDL CALC: 132 mg/dL — AB (ref 0–99)
NONHDL: 152.34
TRIGLYCERIDES: 100 mg/dL (ref 0.0–149.0)
VLDL: 20 mg/dL (ref 0.0–40.0)

## 2015-09-18 LAB — COMPREHENSIVE METABOLIC PANEL
ALT: 21 U/L (ref 0–35)
AST: 19 U/L (ref 0–37)
Albumin: 4 g/dL (ref 3.5–5.2)
Alkaline Phosphatase: 60 U/L (ref 39–117)
BUN: 14 mg/dL (ref 6–23)
CALCIUM: 9.4 mg/dL (ref 8.4–10.5)
CHLORIDE: 105 meq/L (ref 96–112)
CO2: 30 meq/L (ref 19–32)
Creatinine, Ser: 0.86 mg/dL (ref 0.40–1.20)
GFR: 69.03 mL/min (ref >60.00)
GLUCOSE: 105 mg/dL — AB (ref 70–99)
Potassium: 3.9 mEq/L (ref 3.5–5.1)
Sodium: 142 mEq/L (ref 135–145)
Total Bilirubin: 0.5 mg/dL (ref 0.2–1.2)
Total Protein: 7.1 g/dL (ref 6.0–8.3)

## 2015-09-18 LAB — URIC ACID: Uric Acid, Serum: 4.3 mg/dL (ref 2.4–7.0)

## 2015-09-18 NOTE — Addendum Note (Signed)
Addended by: Ellamae Sia on: 09/18/2015 12:24 PM   Modules accepted: Orders

## 2015-09-19 LAB — HEPATITIS C ANTIBODY: HCV Ab: NEGATIVE

## 2015-09-21 ENCOUNTER — Encounter: Payer: Medicare Other | Admitting: Family Medicine

## 2015-09-26 ENCOUNTER — Encounter: Payer: Self-pay | Admitting: Family Medicine

## 2015-09-26 ENCOUNTER — Ambulatory Visit (INDEPENDENT_AMBULATORY_CARE_PROVIDER_SITE_OTHER): Payer: Medicare Other | Admitting: Family Medicine

## 2015-09-26 VITALS — BP 156/84 | HR 68 | Temp 98.0°F | Ht 67.0 in | Wt 223.8 lb

## 2015-09-26 DIAGNOSIS — M170 Bilateral primary osteoarthritis of knee: Secondary | ICD-10-CM

## 2015-09-26 DIAGNOSIS — J449 Chronic obstructive pulmonary disease, unspecified: Secondary | ICD-10-CM

## 2015-09-26 DIAGNOSIS — I10 Essential (primary) hypertension: Secondary | ICD-10-CM

## 2015-09-26 DIAGNOSIS — Z0000 Encounter for general adult medical examination without abnormal findings: Secondary | ICD-10-CM

## 2015-09-26 DIAGNOSIS — Z7189 Other specified counseling: Secondary | ICD-10-CM

## 2015-09-26 DIAGNOSIS — E785 Hyperlipidemia, unspecified: Secondary | ICD-10-CM

## 2015-09-26 DIAGNOSIS — IMO0001 Reserved for inherently not codable concepts without codable children: Secondary | ICD-10-CM

## 2015-09-26 MED ORDER — TIOTROPIUM BROMIDE MONOHYDRATE 18 MCG IN CAPS: 18.0000 ug | ORAL_CAPSULE | Freq: Every day | RESPIRATORY_TRACT | Status: DC

## 2015-09-26 MED ORDER — TRAMADOL HCL 50 MG PO TABS: 50.0000 mg | ORAL_TABLET | Freq: Two times a day (BID) | ORAL | Status: DC | PRN

## 2015-09-26 MED ORDER — AMLODIPINE BESYLATE 10 MG PO TABS: 10.0000 mg | ORAL_TABLET | Freq: Every day | ORAL | Status: DC

## 2015-09-26 NOTE — Progress Notes (Signed)
Pre visit review using our clinic review tool, if applicable. No additional management support is needed unless otherwise documented below in the visit note. 

## 2015-09-26 NOTE — Assessment & Plan Note (Signed)
Chronic, stable. Continue symbicort and spiriva

## 2015-09-26 NOTE — Progress Notes (Addendum)
BP 156/84 mmHg  Pulse 68  Temp(Src) 98 F (36.7 C) (Oral)  Ht 5\' 7"  (1.702 m)  Wt 223 lb 12.8 oz (101.515 kg)  BMI 35.04 kg/m2   CC: medicare wellness visit  Subjective:    Patient ID: Monica Wise, female    DOB: 1945-09-17, 70 y.o.   MRN: [id number]  HPI: Monica Wise is a 70 y.o. female presenting on 09/26/2015 for Annual Exam   HTN - compliaint with amlodipine 5mg  daily and lasix 20mg  PRN. Endorses better control at home (120-140/60-70s). She has multiple intolerances including ramipril and olmesartan. Attributes elevated readings to sausage biscuits. Denies chest pain, leg swelling or vision changes or dyspnea. Occasional headaches.  Known aortic valve sclerosis without stenosis.   COPD - requests refill of spiriva. Reports compliance with symbicort.  Known knee arthritis - has been prescribed oxycodone 5mg  - does not feel this helped at all. Requests tramadol script.   Hearing screen - passed Vision screen passed No falls in last year Denies depression/anhedonia, sadness  Preventative: COLONOSCOPY Date: 2010 hyperplastic polyps identified. rpt 10 yrs ([facility]) Mammogram 06/2015 birads 1.  Well woman exam - remotely. Discussed, declines further eval.  DEXA Date: 09/2014 T -1.2 femur neck Flu shot - yearly Presumed pneumovax 2001, prevnar today zostavax 07/2014 G1P1 Advanced directive: son is POA. Will bring copy of POA to review Seat belt use discussed Sunscreen use discussed. No changing moles on skin.  Lives alone, 2 feral cats Occupation: retired, worked for [employer] Edu: [employer] Activity: works in yard, unable to walk 2/2 knee pain Diet: good water, fruits/vegetables daily  Relevant past medical, surgical, family and social history reviewed and updated as indicated. Interim medical history since our last visit reviewed. Allergies and medications reviewed and updated. Current Outpatient Prescriptions on File Prior to Visit  Medication Sig  . albuterol  (PROVENTIL HFA;VENTOLIN HFA) 108 (90 BASE) MCG/ACT inhaler Inhale 1-2 puffs into the lungs every 6 (six) hours as needed for wheezing or shortness of breath.  Marland Kitchen aspirin 81 MG tablet Take 81 mg by mouth as needed.   . ezetimibe (ZETIA) 10 MG tablet Take 1 tablet (10 mg total) by mouth daily. MUST SCHEDULE ANNUAL WELLNESS EXAM  . famotidine (PEPCID) 20 MG tablet Take 20 mg by mouth as needed.   . furosemide (LASIX) 20 MG tablet Take 20 mg by mouth daily as needed for fluid.   . Glucosamine-Chondroitin (MOVE FREE PO) Take by mouth as needed.   . Loperamide HCl (ANTI-DIARRHEAL PO) Take by mouth.  . montelukast (SINGULAIR) 10 MG tablet TAKE 1 TABLET BY MOUTH EVERY DAY IN THE EVENING  . Multiple Vitamin (MULTIVITAMIN) tablet Take 1 tablet by mouth daily.  . Omega-3 Krill Oil 500 MG CAPS Take 500 mg by mouth daily.  Marland Kitchen omeprazole (PRILOSEC) 20 MG capsule Take 20 mg by mouth as needed. Reported on 07/31/2015  . OVER THE COUNTER MEDICATION daily. Ginsana  . potassium chloride (K-DUR) 10 MEQ tablet Take 10 mEq by mouth daily as needed. With lasix  . SYMBICORT 160-4.5 MCG/ACT inhaler TAKE 2 PUFFS TWICE A DAY  . cetirizine (ZYRTEC) 10 MG tablet Take 10 mg by mouth as needed. Reported on 09/26/2015   No current facility-administered medications on file prior to visit.    Review of Systems  Constitutional: Negative for fever, chills, activity change, appetite change, fatigue and unexpected weight change.  HENT: Negative for hearing loss.   Eyes: Negative for visual disturbance.  Respiratory: Positive for shortness  of breath (intermittent). Negative for cough, chest tightness and wheezing.   Cardiovascular: Positive for leg swelling. Negative for chest pain and palpitations.  Gastrointestinal: Negative for nausea, vomiting, abdominal pain, diarrhea, constipation, blood in stool and abdominal distention.  Genitourinary: Negative for hematuria and difficulty urinating.  Musculoskeletal: Negative for myalgias,  arthralgias and neck pain.  Skin: Negative for rash.  Neurological: Negative for dizziness, seizures, syncope and headaches.  Hematological: Negative for adenopathy. Bruises/bleeds easily.  Psychiatric/Behavioral: Positive for dysphoric mood (situational - less active). The patient is not nervous/anxious.    Per HPI unless specifically indicated in ROS section     Objective:    BP 156/84 mmHg  Pulse 68  Temp(Src) 98 F (36.7 C) (Oral)  Ht 5\' 7"  (1.702 m)  Wt 223 lb 12.8 oz (101.515 kg)  BMI 35.04 kg/m2  Wt Readings from Last 3 Encounters:  09/26/15 223 lb 12.8 oz (101.515 kg)  08/25/15 223 lb (101.152 kg)  07/31/15 223 lb 1.9 oz (101.207 kg)    Physical Exam  Constitutional: She is oriented to person, place, and time. She appears well-developed and well-nourished. No distress.  HENT:  Head: Normocephalic and atraumatic.  Right Ear: Hearing, tympanic membrane, external ear and ear canal normal.  Left Ear: Hearing, tympanic membrane, external ear and ear canal normal.  Nose: Nose normal.  Mouth/Throat: Uvula is midline, oropharynx is clear and moist and mucous membranes are normal. No oropharyngeal exudate, posterior oropharyngeal edema or posterior oropharyngeal erythema.  Eyes: Conjunctivae and EOM are normal. Pupils are equal, round, and reactive to light. No scleral icterus.  Neck: Normal range of motion. Neck supple. Carotid bruit is present (presumed radiation from aortic murmur). No thyromegaly present.  Cardiovascular: Normal rate, regular rhythm and intact distal pulses.   Murmur (3/6 harsh murmur) heard. Pulses:      Radial pulses are 2+ on the right side, and 2+ on the left side.  Pulmonary/Chest: Effort normal and breath sounds normal. No respiratory distress. She has no wheezes. She has no rales.  Breast exam recently done by surg.  Abdominal: Soft. Bowel sounds are normal. She exhibits no distension and no mass. There is no tenderness. There is no rebound and no  guarding.  Genitourinary:  GYN - declines  Musculoskeletal: Normal range of motion. She exhibits no edema.  Lymphadenopathy:    She has no cervical adenopathy.  Neurological: She is alert and oriented to person, place, and time.  CN grossly intact, station and gait intact  Skin: Skin is warm and dry. No rash noted.  Psychiatric: She has a normal mood and affect. Her behavior is normal. Judgment and thought content normal.  Nursing note and vitals reviewed.  Results for orders placed or performed in visit on 09/26/15  HM COLONOSCOPY  Result Value Ref Range   HM Colonoscopy per patient        Assessment & Plan:   Problem List Items Addressed This Visit    Osteoarthritis    States oxycodone not helpful. Requests refill of tramadol      Relevant Medications   traMADol (ULTRAM) 50 MG tablet   Obesity, Class II, BMI 35-39.9, with comorbidity ([place])    Discussed healthy diet and lifestyle changes to affect sustainable weight loss.      Medicare annual wellness visit, subsequent - Primary    I have personally reviewed the Medicare Annual Wellness questionnaire and have noted 1. The patient's medical and social history 2. Their use of alcohol, tobacco or illicit drugs  3. Their current medications and supplements 4. The patient's functional ability including ADL's, fall risks, home safety risks and hearing or visual impairment. Cognitive function has been assessed and addressed as indicated.  5. Diet and physical activity 6. Evidence for depression or mood disorders The patients weight, height, BMI have been recorded in the chart. I have made referrals, counseling and provided education to the patient based on review of the above and I have provided the pt with a written personalized care plan for preventive services. Provider list updated.. See scanned questionairre as needed for further documentation. Reviewed preventative protocols and updated unless pt declined.        Hyperlipidemia    Reviewed #s. Continue zetia and omega 3 fatty acids      Relevant Medications   amLODipine (NORVASC) 10 MG tablet   Health maintenance examination    Preventative protocols reviewed and updated unless pt declined. Discussed healthy diet and lifestyle.       Essential hypertension    Chronic. Component of white coat hypertension.  bp elevated today and on recheck 180/90. Will increase amlodipine to 10mg  daily. Return 1 mo f/u.       Relevant Medications   amLODipine (NORVASC) 10 MG tablet   COPD (chronic obstructive pulmonary disease) ([facility])    Chronic, stable. Continue symbicort and spiriva      Relevant Medications   tiotropium (SPIRIVA HANDIHALER) 18 MCG inhalation capsule   Advanced care planning/counseling discussion    Advanced directive: son is POA. Will bring copy of POA to review          Follow up plan: Return in about 4 weeks (around 10/24/2015), or as needed, for follow up visit.

## 2015-09-26 NOTE — Assessment & Plan Note (Signed)
Discussed healthy diet and lifestyle changes to affect sustainable weight loss  

## 2015-09-26 NOTE — Assessment & Plan Note (Addendum)
Reviewed #s. Continue zetia and omega 3 fatty acids

## 2015-09-26 NOTE — Addendum Note (Signed)
Addended by: Ria Bush on: 09/26/2015 11:19 PM   Modules accepted: Miquel Dunn

## 2015-09-26 NOTE — Assessment & Plan Note (Signed)
Preventative protocols reviewed and updated unless pt declined. Discussed healthy diet and lifestyle.  

## 2015-09-26 NOTE — Assessment & Plan Note (Addendum)
Chronic. Component of white coat hypertension.  bp elevated today and on recheck 180/90. Will increase amlodipine to 10mg  daily. Return 1 mo f/u.

## 2015-09-26 NOTE — Assessment & Plan Note (Signed)

## 2015-09-26 NOTE — Assessment & Plan Note (Signed)
Advanced directive: son is POA. Will bring copy of POA to review

## 2015-09-26 NOTE — Patient Instructions (Addendum)
Stop oxycodone. I've printed out tramadol prescription.  Goal 1212m calcium daily, ideally all in diet. Every 8 oz glass of water or calcium fortified OJ has 3058m  Bring [place] of POA to update your chart. Increase amlodipine to 1054maily. New dose at pharmacy. Return in 1 month with bp cuff to recheck levles.  Watch salt in diet.  Good to see you today, call us Kore[place] quesitons.  Health Maintenance, Female Adopting a healthy lifestyle and getting preventive care can go a long way to promote health and wellness. Talk with your health care provider about what schedule of regular examinations is right for you. This is a good chance for you to check in with your provider about disease prevention and staying healthy. In between checkups, there are plenty of things you can do on your own. Experts have done a lot of research about which lifestyle changes and preventive measures are most likely to keep you healthy. Ask your health care provider for more information. WEIGHT AND DIET  Eat a healthy diet  Be sure to include plenty of vegetables, fruits, low-fat dairy products, and lean protein.  Do not eat a lot of foods high in solid fats, added sugars, or salt.  Get regular exercise. This is one of the most important things you can do for your health.  Most adults should exercise for at least 150 minutes each week. The exercise should increase your heart rate and make you sweat (moderate-intensity exercise).  Most adults should also do strengthening exercises at least twice a week. This is in addition to the moderate-intensity exercise.  Maintain a healthy weight  Body mass index (BMI) is a measurement that can be used to identify possible weight problems. It estimates body fat based on height and weight. Your health care provider can help determine your BMI and help you achieve or maintain a healthy weight.  For females 20 58ars of age and older:   A BMI below 18.5 is considered  underweight.  A BMI of 18.5 to 24.9 is normal.  A BMI of 25 to 29.9 is considered overweight.  A BMI of 30 and above is considered obese.  Watch levels of cholesterol and blood lipids  You should start having your blood tested for lipids and cholesterol at 20 19ars of age, then have this test every 5 years.  You may need to have your cholesterol levels checked more often if:  Your lipid or cholesterol levels are high.  You are older than 50 33ars of age.  You are at high risk for heart disease.  CANCER SCREENING   Lung Cancer  Lung cancer screening is recommended for adults 55-10 31ars old who are at high risk for lung cancer because of a history of smoking.  A yearly low-dose CT scan of the lungs is recommended for people who:  Currently smoke.  Have quit within the past 15 years.  Have at least a 30-pack-year history of smoking. A pack year is smoking an average of one pack of cigarettes a day for 1 year.  Yearly screening should continue until it has been 15 years since you quit.  Yearly screening should stop if you develop a health problem that would prevent you from having lung cancer treatment.  Breast Cancer  Practice breast self-awareness. This means understanding how your breasts normally appear and feel.  It also means doing regular breast self-exams. Let your health care provider know about any changes, no matter how small.  If  you are in your 20s or 30s, you should have a clinical breast exam (CBE) by a health care provider every 1-3 years as part of a regular health exam.  If you are 25 or older, have a CBE every year. Also consider having a breast X-ray (mammogram) every year.  If you have a family history of breast cancer, talk to your health care provider about genetic screening.  If you are at high risk for breast cancer, talk to your health care provider about having an MRI and a mammogram every year.  Breast cancer gene (BRCA) assessment is  recommended for women who have family members with BRCA-related cancers. BRCA-related cancers include:  Breast.  Ovarian.  Tubal.  Peritoneal cancers.  Results of the assessment will determine the need for genetic counseling and BRCA1 and BRCA2 testing. Cervical Cancer Your health care provider may recommend that you be screened regularly for cancer of the pelvic organs (ovaries, uterus, and vagina). This screening involves a pelvic examination, including checking for microscopic changes to the surface of your cervix (Pap test). You may be encouraged to have this screening done every 3 years, beginning at age 31.  For women ages 41-65, health care providers may recommend pelvic exams and Pap testing every 3 years, or they may recommend the Pap and pelvic exam, combined with testing for human papilloma virus (HPV), every 5 years. Some types of HPV increase your risk of cervical cancer. Testing for HPV may also be done on women of any age with unclear Pap test results.  Other health care providers may not recommend any screening for nonpregnant women who are considered low risk for pelvic cancer and who do not have symptoms. Ask your health care provider if a screening pelvic exam is right for you.  If you have had past treatment for cervical cancer or a condition that could lead to cancer, you need Pap tests and screening for cancer for at least 20 years after your treatment. If Pap tests have been discontinued, your risk factors (such as having a new sexual partner) need to be reassessed to determine if screening should resume. Some women have medical problems that increase the chance of getting cervical cancer. In these cases, your health care provider may recommend more frequent screening and Pap tests. Colorectal Cancer  This type of cancer can be detected and often prevented.  Routine colorectal cancer screening usually begins at 72 years of age and continues through 72 years of  age.  Your health care provider may recommend screening at an earlier age if you have risk factors for colon cancer.  Your health care provider may also recommend using home test kits to check for hidden blood in the stool.  A small camera at the end of a tube can be used to examine your colon directly (sigmoidoscopy or colonoscopy). This is done to check for the earliest forms of colorectal cancer.  Routine screening usually begins at age 45.  Direct examination of the colon should be repeated every 5-10 years through 72 years of age. However, you may need to be screened more often if early forms of precancerous polyps or small growths are found. Skin Cancer  Check your skin from head to toe regularly.  Tell your health care provider about any new moles or changes in moles, especially if there is a change in a mole's shape or color.  Also tell your health care provider if you have a mole that is larger than the size  of a pencil eraser.  Always use sunscreen. Apply sunscreen liberally and repeatedly throughout the day.  Protect yourself by wearing long sleeves, pants, a wide-brimmed hat, and sunglasses whenever you are outside. HEART DISEASE, DIABETES, AND HIGH BLOOD PRESSURE   High blood pressure causes heart disease and increases the risk of stroke. High blood pressure is more likely to develop in:  People who have blood pressure in the high end of the normal range (130-139/85-89 mm Hg).  People who are overweight or obese.  People who are African American.  If you are 50-36 years of age, have your blood pressure checked every 3-5 years. If you are 13 years of age or older, have your blood pressure checked every year. You should have your blood pressure measured twice--once when you are at a hospital or clinic, and once when you are not at a hospital or clinic. Record the average of the two measurements. To check your blood pressure when you are not at a hospital or clinic, you can  use:  An automated blood pressure machine at a pharmacy.  A home blood pressure monitor.  If you are between 82 years and 86 years old, ask your health care provider if you should take aspirin to prevent strokes.  Have regular diabetes screenings. This involves taking a blood sample to check your fasting blood sugar level.  If you are at a normal weight and have a low risk for diabetes, have this test once every three years after 72 years of age.  If you are overweight and have a high risk for diabetes, consider being tested at a younger age or more often. PREVENTING INFECTION  Hepatitis B  If you have a higher risk for hepatitis B, you should be screened for this virus. You are considered at high risk for hepatitis B if:  You were born in a country where hepatitis B is common. Ask your health care provider which countries are considered high risk.  Your parents were born in a high-risk country, and you have not been immunized against hepatitis B (hepatitis B vaccine).  You have HIV or AIDS.  You use needles to inject street drugs.  You live with someone who has hepatitis B.  You have had sex with someone who has hepatitis B.  You get hemodialysis treatment.  You take certain medicines for conditions, including cancer, organ transplantation, and autoimmune conditions. Hepatitis C  Blood testing is recommended for:  Everyone born from [birth year] through 1967.  Anyone with known risk factors for hepatitis C. Sexually transmitted infections (STIs)  You should be screened for sexually transmitted infections (STIs) including gonorrhea and chlamydia if:  You are sexually active and are younger than 72 years of age.  You are older than 72 years of age and your health care provider tells you that you are at risk for this type of infection.  Your sexual activity has changed since you were last screened and you are at an increased risk for chlamydia or gonorrhea. Ask your health care  provider if you are at risk.  If you do not have HIV, but are at risk, it may be recommended that you take a prescription medicine daily to prevent HIV infection. This is called pre-exposure prophylaxis (PrEP). You are considered at risk if:  You are sexually active and do not regularly use condoms or know the HIV status of your partner(s).  You take drugs by injection.  You are sexually active with a partner who has  HIV. Talk with your health care provider about whether you are at high risk of being infected with HIV. If you choose to begin PrEP, you should first be tested for HIV. You should then be tested every 3 months for as long as you are taking PrEP.  PREGNANCY   If you are premenopausal and you may become pregnant, ask your health care provider about preconception counseling.  If you may become pregnant, take 400 to 800 micrograms (mcg) of folic acid every day.  If you want to prevent pregnancy, talk to your health care provider about birth control (contraception). OSTEOPOROSIS AND MENOPAUSE   Osteoporosis is a disease in which the bones lose minerals and strength with aging. This can result in serious bone fractures. Your risk for osteoporosis can be identified using a bone density scan.  If you are 72 years of age or older, or if you are at risk for osteoporosis and fractures, ask your health care provider if you should be screened.  Ask your health care provider whether you should take a calcium or vitamin D supplement to lower your risk for osteoporosis.  Menopause may have certain physical symptoms and risks.  Hormone replacement therapy may reduce some of these symptoms and risks. Talk to your health care provider about whether hormone replacement therapy is right for you.  HOME CARE INSTRUCTIONS   Schedule regular health, dental, and eye exams.  Stay current with your immunizations.   Do not use any tobacco products including cigarettes, chewing tobacco, or  electronic cigarettes.  If you are pregnant, do not drink alcohol.  If you are breastfeeding, limit how much and how often you drink alcohol.  Limit alcohol intake to no more than 1 drink per day for nonpregnant women. One drink equals 12 ounces of beer, 5 ounces of wine, or 1 ounces of hard liquor.  Do not use street drugs.  Do not share needles.  Ask your health care provider for help if you need support or information about quitting drugs.  Tell your health care provider if you often feel depressed.  Tell your health care provider if you have ever been abused or do not feel safe at home.   This information is not intended to replace advice given to you by your health care provider. Make sure you discuss any questions you have with your health care provider.   Document Released: 12/31/2010 Document Revised: 07/08/2014 Document Reviewed: 05/19/2013 Elsevier Interactive Patient Education [employer].

## 2015-09-26 NOTE — Assessment & Plan Note (Signed)
States oxycodone not helpful. Requests refill of tramadol

## 2015-09-28 ENCOUNTER — Telehealth: Payer: Self-pay | Admitting: *Deleted

## 2015-09-28 NOTE — Telephone Encounter (Signed)
Yes she can receive. plz have her come in for prevnar. Thank you.

## 2015-09-28 NOTE — Telephone Encounter (Signed)
Message left for patient to return my call and schedule nurse visit.

## 2015-09-28 NOTE — Telephone Encounter (Signed)
Pt left voicemail at Triage. She said at her recent CPE pt and Dr. Darnell Level discussed getting an pneumonia vaccine but she didn't get it at her CPE, pt wants to make sure she can get one and if so she wants a call back to get appt scheduled to get vaccine

## 2015-10-03 ENCOUNTER — Ambulatory Visit (INDEPENDENT_AMBULATORY_CARE_PROVIDER_SITE_OTHER): Payer: Medicare Other | Admitting: *Deleted

## 2015-10-03 DIAGNOSIS — Z23 Encounter for immunization: Secondary | ICD-10-CM | POA: Diagnosis not present

## 2015-10-26 ENCOUNTER — Ambulatory Visit (INDEPENDENT_AMBULATORY_CARE_PROVIDER_SITE_OTHER): Payer: Medicare Other | Admitting: Family Medicine

## 2015-10-26 ENCOUNTER — Encounter: Payer: Self-pay | Admitting: Family Medicine

## 2015-10-26 VITALS — BP 148/84 | HR 64 | Temp 97.7°F | Wt 221.8 lb

## 2015-10-26 DIAGNOSIS — I10 Essential (primary) hypertension: Secondary | ICD-10-CM

## 2015-10-26 MED ORDER — AMLODIPINE BESYLATE 5 MG PO TABS: 5.0000 mg | ORAL_TABLET | Freq: Every day | ORAL | Status: DC

## 2015-10-26 MED ORDER — FUROSEMIDE 20 MG PO TABS: 20.0000 mg | ORAL_TABLET | Freq: Every day | ORAL | Status: DC | PRN

## 2015-10-26 MED ORDER — POTASSIUM CHLORIDE ER 10 MEQ PO TBCR: 10.0000 meq | EXTENDED_RELEASE_TABLET | Freq: Every day | ORAL | Status: DC | PRN

## 2015-10-26 NOTE — Patient Instructions (Addendum)
Sounds like blood pressures are running better at home. Ok to return to 5mg  amlodipine dose. May use lasix for a few days with potassium if blood pressure creeping up.  Start monitoring blood pressure regularly daily or every other day.  Let me know if readings consistently >150/90. Look into DASH diet.   DASH Eating Plan DASH stands for "Dietary Approaches to Stop Hypertension." The DASH eating plan is a healthy eating plan that has been shown to reduce high blood pressure (hypertension). Additional health benefits may include reducing the risk of type 2 diabetes mellitus, heart disease, and stroke. The DASH eating plan may also help with weight loss. WHAT DO I NEED TO KNOW ABOUT THE DASH EATING PLAN? For the DASH eating plan, you will follow these general guidelines:  Choose foods with a percent daily value for sodium of less than 5% (as listed on the food label).  Use salt-free seasonings or herbs instead of table salt or sea salt.  Check with your health care provider or pharmacist before using salt substitutes.  Eat lower-sodium products, often labeled as "lower sodium" or "no salt added."  Eat fresh foods.  Eat more vegetables, fruits, and low-fat dairy products.  Choose whole grains. Look for the word "whole" as the first word in the ingredient list.  Choose fish and skinless chicken or [place] more often than red meat. Limit fish, poultry, and meat to 6 oz (170 g) each day.  Limit sweets, desserts, sugars, and sugary drinks.  Choose heart-healthy fats.  Limit cheese to 1 oz (28 g) per day.  Eat more home-cooked food and less restaurant, buffet, and fast food.  Limit fried foods.  Cook foods using methods other than frying.  Limit canned vegetables. If you do use them, rinse them well to decrease the sodium.  When eating at a restaurant, ask that your food be prepared with less salt, or no salt if possible. WHAT FOODS CAN I EAT? Seek help from a dietitian for  individual calorie needs. Grains Whole grain or whole wheat bread. Brown rice. Whole grain or whole wheat pasta. Quinoa, bulgur, and whole grain cereals. Low-sodium cereals. Corn or whole wheat flour tortillas. Whole grain cornbread. Whole grain crackers. Low-sodium crackers. Vegetables Fresh or frozen vegetables (raw, steamed, roasted, or grilled). Low-sodium or reduced-sodium tomato and vegetable juices. Low-sodium or reduced-sodium tomato sauce and paste. Low-sodium or reduced-sodium canned vegetables.  Fruits All fresh, canned (in natural juice), or frozen fruits. Meat and Other Protein Products Ground beef (85% or leaner), grass-fed beef, or beef trimmed of fat. Skinless chicken or [place]. Ground chicken or [place]. Pork trimmed of fat. All fish and seafood. Eggs. Dried beans, peas, or lentils. Unsalted nuts and seeds. Unsalted canned beans. Dairy Low-fat dairy products, such as skim or 1% milk, 2% or reduced-fat cheeses, low-fat ricotta or cottage cheese, or plain low-fat yogurt. Low-sodium or reduced-sodium cheeses. Fats and Oils Tub margarines without trans fats. Light or reduced-fat mayonnaise and salad dressings (reduced sodium). Avocado. Safflower, olive, or canola oils. Natural peanut or almond butter. Other Unsalted popcorn and pretzels. The items listed above may not be a complete list of recommended foods or beverages. Contact your dietitian for more options. WHAT FOODS ARE NOT RECOMMENDED? Grains White bread. White pasta. White rice. Refined cornbread. Bagels and croissants. Crackers that contain trans fat. Vegetables Creamed or fried vegetables. Vegetables in a cheese sauce. Regular canned vegetables. Regular canned tomato sauce and paste. Regular tomato and vegetable juices. Fruits Dried fruits. Canned fruit  in light or heavy syrup. Fruit juice. Meat and Other Protein Products Fatty cuts of meat. Ribs, chicken wings, bacon, sausage, bologna, salami, chitterlings, fatback, hot  dogs, bratwurst, and packaged luncheon meats. Salted nuts and seeds. Canned beans with salt. Dairy Whole or 2% milk, cream, half-and-half, and cream cheese. Whole-fat or sweetened yogurt. Full-fat cheeses or blue cheese. Nondairy creamers and whipped toppings. Processed cheese, cheese spreads, or cheese curds. Condiments Onion and garlic salt, seasoned salt, table salt, and sea salt. Canned and packaged gravies. Worcestershire sauce. Tartar sauce. Barbecue sauce. Teriyaki sauce. Soy sauce, including reduced sodium. Steak sauce. Fish sauce. Oyster sauce. Cocktail sauce. Horseradish. Ketchup and mustard. Meat flavorings and tenderizers. Bouillon cubes. Hot sauce. Tabasco sauce. Marinades. Taco seasonings. Relishes. Fats and Oils Butter, stick margarine, lard, shortening, ghee, and bacon fat. Coconut, palm kernel, or palm oils. Regular salad dressings. Other Pickles and olives. Salted popcorn and pretzels. The items listed above may not be a complete list of foods and beverages to avoid. Contact your dietitian for more information. WHERE CAN I FIND MORE INFORMATION? National Heart, Lung, and Blood Institute: travelstabloid.com   This information is not intended to replace advice given to you by your health care provider. Make sure you discuss any questions you have with your health care provider.   Document Released: 06/06/2011 Document Revised: 07/08/2014 Document Reviewed: 04/21/2013 Elsevier Interactive Patient Education [employer].

## 2015-10-26 NOTE — Progress Notes (Addendum)
BP 148/84 mmHg  Pulse 64  Temp(Src) 97.7 F (36.5 C) (Oral)  Wt 221 lb 12 oz (100.585 kg)   CC: 1 mo f/u visit  Subjective:    Patient ID: Monica Wise, female    DOB: [birth year], 70 y.o.   MRN: [id number]  HPI: Monica Wise is a 70 y.o. female presenting on 10/26/2015 for Follow-up   See prior note for details. Briefly - last visit found to have persistent hypertension to 156/84 so we increase amlodipine to 10mg  daily. continued lasix 20mg  prn. bp at home running 110-133/70-80s. She has multiple intolerances including ramipril and olmesartan (dropped bp too low). Denied chest pain, leg swelling or vision changes or dyspnea. Ongoing headaches but attributes to hunger today, sinus otherwise.  Higher amlodipine dose caused worsening left pedal edema.   Brought home BP cuff with comparable readings to our cuff.   I checked bp today 190/80, pt rechecked with home cuff 186/90. However earlier readings better controlled and patient states home readings well controlled.   Relevant past medical, surgical, family and social history reviewed and updated as indicated. Interim medical history since our last visit reviewed. Allergies and medications reviewed and updated. Current Outpatient Prescriptions on File Prior to Visit  Medication Sig  . albuterol (PROVENTIL HFA;VENTOLIN HFA) 108 (90 BASE) MCG/ACT inhaler Inhale 1-2 puffs into the lungs every 6 (six) hours as needed for wheezing or shortness of breath.  Marland Kitchen aspirin 81 MG tablet Take 81 mg by mouth as needed.   . Cholecalciferol (VITAMIN D) 2000 units CAPS Take 1 capsule by mouth daily.  Marland Kitchen ezetimibe (ZETIA) 10 MG tablet Take 1 tablet (10 mg total) by mouth daily. MUST SCHEDULE ANNUAL WELLNESS EXAM  . famotidine (PEPCID) 20 MG tablet Take 20 mg by mouth as needed.   . Glucosamine-Chondroitin (MOVE FREE PO) Take by mouth as needed.   . Loperamide HCl (ANTI-DIARRHEAL PO) Take by mouth.  . montelukast (SINGULAIR) 10 MG tablet TAKE 1 TABLET BY  MOUTH EVERY DAY IN THE EVENING  . Multiple Vitamin (MULTIVITAMIN) tablet Take 1 tablet by mouth daily.  . Omega-3 Krill Oil 500 MG CAPS Take 500 mg by mouth daily.  Marland Kitchen omeprazole (PRILOSEC) 20 MG capsule Take 20 mg by mouth as needed. Reported on 07/31/2015  . OVER THE COUNTER MEDICATION as needed. Ginsana  . SYMBICORT 160-4.5 MCG/ACT inhaler TAKE 2 PUFFS TWICE A DAY  . tiotropium (SPIRIVA HANDIHALER) 18 MCG inhalation capsule Place 1 capsule (18 mcg total) into inhaler and inhale daily.  . traMADol (ULTRAM) 50 MG tablet Take 1 tablet (50 mg total) by mouth 2 (two) times daily as needed.   No current facility-administered medications on file prior to visit.    Review of Systems Per HPI unless specifically indicated in Monica section     Objective:    BP 148/84 mmHg  Pulse 64  Temp(Src) 97.7 F (36.5 C) (Oral)  Wt 221 lb 12 oz (100.585 kg)  Wt Readings from Last 3 Encounters:  10/26/15 221 lb 12 oz (100.585 kg)  09/26/15 223 lb 12.8 oz (101.515 kg)  08/25/15 223 lb (101.152 kg)    Physical Exam  Constitutional: She appears well-developed and well-nourished. No distress.  HENT:  Mouth/Throat: Oropharyngeal exudate present.  Cardiovascular: Normal rate, regular rhythm and intact distal pulses.   Murmur (3/6 SEM best LUSB) heard. Pulmonary/Chest: Effort normal and breath sounds normal. No respiratory distress. She has no wheezes. She has no rales.  Musculoskeletal: She exhibits edema (nonpitting).  Skin: Skin is warm and dry. No rash noted.  Psychiatric: She has a normal mood and affect.  Nursing note and vitals reviewed.  Results for orders placed or performed in visit on 09/26/15  HM COLONOSCOPY  Result Value Ref Range   HM Colonoscopy per patient     Lab Results  Component Value Date   CREATININE 0.86 09/18/2015       Assessment & Plan:   Problem List Items Addressed This Visit    Essential hypertension - Primary    Did not tolerate higher amlodipine dose - however  endorses good control at home 99991111 systolic. Will decrease to 5mg  amlodipine, discussed ok to increase frequency of lasix 20mg  PRN + Kdur.  DASH diet provided Pt will monitor bp more closely with home bp wrist cuff (which seems reliable today based on comparison with our cuff) and let me know if persistently >150/90. RTC 5-6 mo f/u visit.      Relevant Medications   furosemide (LASIX) 20 MG tablet   amLODipine (NORVASC) 5 MG tablet       Follow up plan: Return in about 5 months (around 03/27/2016), or if symptoms worsen or fail to improve, for follow up visit.  Ria Bush, MD

## 2015-10-26 NOTE — Assessment & Plan Note (Signed)
Did not tolerate higher amlodipine dose - however endorses good control at home 99991111 systolic. Will decrease to 5mg  amlodipine, discussed ok to increase frequency of lasix 20mg  PRN + Kdur.  DASH diet provided Pt will monitor bp more closely with home bp wrist cuff (which seems reliable today based on comparison with our cuff) and let me know if persistently >150/90. RTC 5-6 mo f/u visit.

## 2015-10-26 NOTE — Progress Notes (Signed)
Pre visit review using our clinic review tool, if applicable. No additional management support is needed unless otherwise documented below in the visit note. 

## 2015-11-01 ENCOUNTER — Telehealth: Payer: Self-pay | Admitting: Family Medicine

## 2015-11-01 NOTE — Telephone Encounter (Signed)
Pt dropped off DMV placard form for renewal by Dr. Lindwood Qua. Please call [phone] when complete. I left in Rx tower.

## 2015-11-02 NOTE — Telephone Encounter (Signed)
Form in Dr Synthia Innocent InBox

## 2015-11-03 NOTE — Telephone Encounter (Signed)
Filled and in Kim's box. 

## 2015-11-06 NOTE — Telephone Encounter (Signed)
Message left notifying patient and form placed up front for pick up. 

## 2015-12-21 ENCOUNTER — Other Ambulatory Visit: Payer: Self-pay | Admitting: Family Medicine

## 2016-01-16 DIAGNOSIS — E784 Other hyperlipidemia: Secondary | ICD-10-CM | POA: Diagnosis not present

## 2016-01-16 DIAGNOSIS — R011 Cardiac murmur, unspecified: Secondary | ICD-10-CM | POA: Diagnosis not present

## 2016-01-16 DIAGNOSIS — I10 Essential (primary) hypertension: Secondary | ICD-10-CM | POA: Diagnosis not present

## 2016-01-31 DIAGNOSIS — R011 Cardiac murmur, unspecified: Secondary | ICD-10-CM | POA: Diagnosis not present

## 2016-03-28 ENCOUNTER — Ambulatory Visit: Payer: Medicare Other | Admitting: Family Medicine

## 2016-03-28 DIAGNOSIS — Z0289 Encounter for other administrative examinations: Secondary | ICD-10-CM

## 2016-04-03 ENCOUNTER — Encounter: Payer: Self-pay | Admitting: *Deleted

## 2016-04-14 ENCOUNTER — Other Ambulatory Visit: Payer: Self-pay | Admitting: Family Medicine

## 2016-05-22 ENCOUNTER — Telehealth: Payer: Self-pay

## 2016-05-22 NOTE — Telephone Encounter (Signed)
Pt called back and she has found a pharmacy with the high dose flu shot and is getting it there; nothing further needed.

## 2016-05-22 NOTE — Telephone Encounter (Signed)
Patient is calling to see if the office has any high dose flu shots available.

## 2016-05-22 NOTE — Telephone Encounter (Signed)
Left v/m requesting pt to cb. 

## 2016-05-27 DIAGNOSIS — Z1231 Encounter for screening mammogram for malignant neoplasm of breast: Secondary | ICD-10-CM | POA: Diagnosis not present

## 2016-05-28 ENCOUNTER — Encounter: Payer: Self-pay | Admitting: General Surgery

## 2016-05-30 ENCOUNTER — Encounter: Payer: Self-pay | Admitting: *Deleted

## 2016-06-05 ENCOUNTER — Ambulatory Visit (INDEPENDENT_AMBULATORY_CARE_PROVIDER_SITE_OTHER): Payer: Medicare Other | Admitting: General Surgery

## 2016-06-05 ENCOUNTER — Encounter: Payer: Self-pay | Admitting: General Surgery

## 2016-06-05 VITALS — BP 156/60 | HR 78 | Resp 16 | Ht 67.0 in | Wt 221.0 lb

## 2016-06-05 DIAGNOSIS — Z1231 Encounter for screening mammogram for malignant neoplasm of breast: Secondary | ICD-10-CM

## 2016-06-05 DIAGNOSIS — Z1239 Encounter for other screening for malignant neoplasm of breast: Secondary | ICD-10-CM

## 2016-06-05 NOTE — Patient Instructions (Signed)
The patient has been asked to return to the office in one year with a bilateral screening mammogram. 

## 2016-06-05 NOTE — Progress Notes (Signed)
Patient ID: Monica Wise, female   DOB: Sep 12, 1945, 70 y.o.   MRN: [id number]  Chief Complaint  Patient presents with  . Follow-up    mammogram    HPI Monica Wise is a 70 y.o. female who presents for a breast evaluation. The most recent mammogram was done on 05/27/2016. Patient does perform regular self breast checks and gets regular mammograms done.    HPI  Past Medical History:  Diagnosis Date  . Aortic valve sclerosis 2014   withoout stenosis, mild aortic insufficiency - Dr Ubaldo Glassing  . Asthma   . Chronic diarrhea 2011   worse in am, ?IBS  . COPD (chronic obstructive pulmonary disease) ([place]) 2005   chronic bronchitis - spirometry FVC 70%, FEV1 72%, abnormal loop volume (unknown date)  . Essential hypertension   . Ex-smoker   . GERD (gastroesophageal reflux disease)   . History of chicken pox   . History of measles   . Hyperlipidemia   . IBS (irritable bowel syndrome)   . Lumbago   . Lump or mass in breast 2012   right breast US guided finesse 1 o'clock  . Obesity   . Osteoarthritis    knees s/p surgeries  . Osteopenia 09/2014   mild by DEXA (-1.2 femur neck)  . Perennial allergic rhinitis     Past Surgical History:  Procedure Laterality Date  . BREAST BIOPSY Right 2012  . BREAST LUMPECTOMY Left 1995   benign mass  . CARDIOVASCULAR STRESS TEST  12/2004   nl myoview, preserved LV fxn with EF 71% without reversible ischemia ([facility])  . COLONOSCOPY  2010   hyperplastic polyps identified. rpt 10 yrs ([facility])  . DEXA  09/2014   T -1.2 femur neck  . ESOPHAGOGASTRODUODENOSCOPY  2010   Monica Wise  . KNEE SURGERY Right 2010   arthroscopic  . KNEE SURGERY Left 2014    Family History  Problem Relation Age of Onset  . Diverticulitis Mother   . Cancer Maternal Aunt     bone, and unsure  . Heart failure Mother   . CAD Father 67    MI x3  . Sudden death Brother 55    Social History Social History  Substance Use Topics  . Smoking status: Former Smoker    Packs/day: 1.00     Years: 30.00    Types: Cigarettes    Quit date: 05/31/2008  . Smokeless tobacco: Never Used  . Alcohol use 0.0 oz/week     Comment: ocassionally    Allergies  Allergen Reactions  . Altace [Ramipril]     unknown  . Benicar [Olmesartan]     unknown  . Codeine Nausea And Vomiting  . Demerol [Meperidine] Nausea And Vomiting  . Statins Other (See Comments)    Myalgias to crestor, refused further statins  . Sulfa Antibiotics     Unknown reaction per patient    Current Outpatient Prescriptions  Medication Sig Dispense Refill  . albuterol (PROVENTIL HFA;VENTOLIN HFA) 108 (90 BASE) MCG/ACT inhaler Inhale 1-2 puffs into the lungs every 6 (six) hours as needed for wheezing or shortness of breath.    Marland Kitchen amLODipine (NORVASC) 5 MG tablet Take 1 tablet (5 mg total) by mouth daily. 30 tablet 11  . aspirin 81 MG tablet Take 81 mg by mouth as needed.     Marland Kitchen b complex vitamins tablet Take 1 tablet by mouth as needed.    . budesonide-formoterol (SYMBICORT) 160-4.5 MCG/ACT inhaler TAKE 2 PUFFS TWICE A DAY 10.2 Inhaler  0  . Cholecalciferol (VITAMIN D) 2000 units CAPS Take 1 capsule by mouth daily.    Marland Kitchen ezetimibe (ZETIA) 10 MG tablet Take 1 tablet (10 mg total) by mouth daily. MUST SCHEDULE ANNUAL WELLNESS EXAM 30 tablet 3  . famotidine (PEPCID) 20 MG tablet Take 20 mg by mouth as needed.     . fexofenadine (ALLEGRA) 180 MG tablet Take 180 mg by mouth daily.    . furosemide (LASIX) 20 MG tablet Take 1 tablet (20 mg total) by mouth daily as needed for fluid. 30 tablet 6  . Glucosamine-Chondroitin (MOVE FREE PO) Take by mouth as needed.     . Loperamide HCl (ANTI-DIARRHEAL PO) Take by mouth.    . montelukast (SINGULAIR) 10 MG tablet TAKE 1 TABLET BY MOUTH EVERY DAY IN THE EVENING 30 tablet 11  . Multiple Vitamin (MULTIVITAMIN) tablet Take 1 tablet by mouth daily.    . Omega-3 Krill Oil 500 MG CAPS Take 500 mg by mouth daily.    Marland Kitchen omeprazole (PRILOSEC) 20 MG capsule Take 20 mg by mouth as needed. Reported  on 07/31/2015    . OVER THE COUNTER MEDICATION as needed. Ginsana    . potassium chloride (K-DUR) 10 MEQ tablet Take 1 tablet (10 mEq total) by mouth daily as needed. With lasix 30 tablet 6  . tiotropium (SPIRIVA HANDIHALER) 18 MCG inhalation capsule Place 1 capsule (18 mcg total) into inhaler and inhale daily. 30 capsule 11  . traMADol (ULTRAM) 50 MG tablet Take 1 tablet (50 mg total) by mouth 2 (two) times daily as needed. 40 tablet 0   No current facility-administered medications for this visit.     Review of Systems Review of Systems  Constitutional: Negative.   Respiratory: Negative.   Cardiovascular: Negative.     Blood pressure (!) 156/60, pulse 78, resp. rate 16, height 5\' 7"  (1.702 m), weight 221 lb (100.2 kg).  Physical Exam Physical Exam  Constitutional: She is oriented to person, place, and time. She appears well-developed and well-nourished.  Eyes: Conjunctivae are normal. No scleral icterus.  Neck: Neck supple.  Cardiovascular: Normal rate and regular rhythm.   Murmur heard.  Systolic murmur is present with a grade of 2/6  Pulmonary/Chest: Effort normal and breath sounds normal. Right breast exhibits no inverted nipple, no mass, no nipple discharge, no skin change and no tenderness. Left breast exhibits no inverted nipple, no mass, no nipple discharge, no skin change and no tenderness.  Abdominal: Soft. Bowel sounds are normal. There is no tenderness.  Lymphadenopathy:    She has no cervical adenopathy.    She has no axillary adenopathy.  Neurological: She is alert and oriented to person, place, and time.  Skin: Skin is warm and dry.    Data Reviewed Bilateral screening mammograms completed at [facility] dated 05/27/2016 were reviewed. No interval change. BI-RADS-2.  Assessment    Unremarkable breast exam.    Plan        The patient has asked to return to the office in one year for repeat clinical exam. We'll arrange for bilateral screening mammogram prior to that  visit.. This information has been scribed by Gaspar Cola CMA.  Monica Wise 06/06/2016, 8:52 AM

## 2016-06-20 ENCOUNTER — Other Ambulatory Visit: Payer: Self-pay | Admitting: Family Medicine

## 2016-06-27 ENCOUNTER — Encounter: Payer: Self-pay | Admitting: Family Medicine

## 2016-06-27 ENCOUNTER — Ambulatory Visit (INDEPENDENT_AMBULATORY_CARE_PROVIDER_SITE_OTHER): Payer: Medicare Other | Admitting: Family Medicine

## 2016-06-27 VITALS — BP 150/80 | HR 69 | Temp 98.0°F | Wt 220.8 lb

## 2016-06-27 DIAGNOSIS — K130 Diseases of lips: Secondary | ICD-10-CM

## 2016-06-27 DIAGNOSIS — E785 Hyperlipidemia, unspecified: Secondary | ICD-10-CM

## 2016-06-27 DIAGNOSIS — K219 Gastro-esophageal reflux disease without esophagitis: Secondary | ICD-10-CM | POA: Diagnosis not present

## 2016-06-27 DIAGNOSIS — I10 Essential (primary) hypertension: Secondary | ICD-10-CM | POA: Diagnosis not present

## 2016-06-27 MED ORDER — DESONIDE 0.05 % EX CREA: TOPICAL_CREAM | Freq: Every day | CUTANEOUS | 0 refills | Status: DC

## 2016-06-27 MED ORDER — EZETIMIBE 10 MG PO TABS: 10.0000 mg | ORAL_TABLET | Freq: Every day | ORAL | 3 refills | Status: DC

## 2016-06-27 NOTE — Assessment & Plan Note (Signed)
Reviewed PPI, pepcid dosing.

## 2016-06-27 NOTE — Assessment & Plan Note (Signed)
Chronic, stable. Good control based on home readings. Continue current regimen of amlodipine 5mg  daily, with lasix 20mg  PRN.Marland Kitchen

## 2016-06-27 NOTE — Assessment & Plan Note (Signed)
Lip inflammation of unknown etiology. rec cool compresses. Suggested desonide cream if not improved with cool compresses. Update if persistent.

## 2016-06-27 NOTE — Assessment & Plan Note (Signed)
Continues krill oil, to restart ezetimibe in the new year. Check FLP next labs.

## 2016-06-27 NOTE — Patient Instructions (Addendum)
You are doing well today. Continue current regimen.  I've refilled generic ezetimibe to fill in 2018.  Return as needed or in 3-4 months for medicare wellness visit.

## 2016-06-27 NOTE — Progress Notes (Signed)
Pre visit review using our clinic review tool, if applicable. No additional management support is needed unless otherwise documented below in the visit note. 

## 2016-06-27 NOTE — Progress Notes (Signed)
BP (!) 150/80 (BP Location: Left Arm, Patient Position: Sitting, Cuff Size: Large)   Pulse 69   Temp 98 F (36.7 C) (Oral)   Wt 220 lb 12 oz (100.1 kg)   SpO2 98%   BMI 34.57 kg/m    CC: med refill visit Subjective:    Patient ID: Monica Wise, female    DOB: 1945/09/10, 70 y.o.   MRN: [id number]  HPI: Monica Wise is a 70 y.o. female presenting on 06/27/2016 for Medication Refill   HTN - Compliant with current antihypertensive regimen of amlodipine 5mg  daily, lasix 20mg  PRN with Kdur (doesn't often take). Does check blood pressures at home: bp 124 this morning. No low blood pressure readings or symptoms of dizziness/syncope.  Denies HA, vision changes, CP/tightness, SOB, leg swelling. She has multiple intolerances including ramipril and olmesartan (dropped bp too low).   HLD - off zetia - generic to decrease price 2018. Will fill then. Also taking krill oil.   GERD - stable on omeprazole and pepcid.   COPD - stable.   Noticing lip irritation over last week. Treating with dental paste, argon oil, blistex. None at angle of lips. No oral lesions. No crusting or drainage. No new meds, foods, lotions or makeup.   Relevant past medical, surgical, family and social history reviewed and updated as indicated. Interim medical history since our last visit reviewed. Allergies and medications reviewed and updated. Current Outpatient Prescriptions on File Prior to Visit  Medication Sig  . albuterol (PROVENTIL HFA;VENTOLIN HFA) 108 (90 BASE) MCG/ACT inhaler Inhale 1-2 puffs into the lungs every 6 (six) hours as needed for wheezing or shortness of breath.  Marland Kitchen amLODipine (NORVASC) 5 MG tablet Take 1 tablet (5 mg total) by mouth daily.  Marland Kitchen aspirin 81 MG tablet Take 81 mg by mouth as needed.   Marland Kitchen b complex vitamins tablet Take 1 tablet by mouth as needed.  . budesonide-formoterol (SYMBICORT) 160-4.5 MCG/ACT inhaler 2 puffs twice daily  . Cholecalciferol (VITAMIN D) 2000 units CAPS Take 1 capsule by  mouth daily.  . famotidine (PEPCID) 20 MG tablet Take 20 mg by mouth as needed.   . fexofenadine (ALLEGRA) 180 MG tablet Take 180 mg by mouth daily.  . furosemide (LASIX) 20 MG tablet Take 1 tablet (20 mg total) by mouth daily as needed for fluid.  . Glucosamine-Chondroitin (MOVE FREE PO) Take by mouth as needed.   . Loperamide HCl (ANTI-DIARRHEAL PO) Take by mouth.  . montelukast (SINGULAIR) 10 MG tablet TAKE 1 TABLET BY MOUTH EVERY DAY IN THE EVENING  . Multiple Vitamin (MULTIVITAMIN) tablet Take 1 tablet by mouth daily.  . Omega-3 Krill Oil 500 MG CAPS Take 500 mg by mouth daily.  Marland Kitchen omeprazole (PRILOSEC) 20 MG capsule Take 20 mg by mouth as needed. Reported on 07/31/2015  . OVER THE COUNTER MEDICATION as needed. Ginsana  . potassium chloride (K-DUR) 10 MEQ tablet Take 1 tablet (10 mEq total) by mouth daily as needed. With lasix  . tiotropium (SPIRIVA HANDIHALER) 18 MCG inhalation capsule Place 1 capsule (18 mcg total) into inhaler and inhale daily.  . traMADol (ULTRAM) 50 MG tablet Take 1 tablet (50 mg total) by mouth 2 (two) times daily as needed.   No current facility-administered medications on file prior to visit.     Review of Systems Per HPI unless specifically indicated in ROS section     Objective:    BP (!) 150/80 (BP Location: Left Arm, Patient Position: Sitting, Cuff Size:  Large)   Pulse 69   Temp 98 F (36.7 C) (Oral)   Wt 220 lb 12 oz (100.1 kg)   SpO2 98%   BMI 34.57 kg/m   Wt Readings from Last 3 Encounters:  06/27/16 220 lb 12 oz (100.1 kg)  06/05/16 221 lb (100.2 kg)  10/26/15 221 lb 12 oz (100.6 kg)    Physical Exam  Constitutional: She appears well-developed and well-nourished. No distress.  HENT:  Head: Normocephalic and atraumatic.  Mouth/Throat: Oropharynx is clear and moist. No oropharyngeal exudate.  No oral lesions  Cardiovascular: Normal rate, regular rhythm and intact distal pulses.   Murmur (3/6 SEM best at LUSB) heard. Pulmonary/Chest:  Effort normal and breath sounds normal. No respiratory distress. She has no wheezes. She has no rales.  Musculoskeletal: She exhibits no edema.  Skin: Skin is warm and dry.  Edema and inflammation of vermillion border of anterior upper and lower lips  Nursing note and vitals reviewed.  Results for orders placed or performed in visit on 09/26/15  HM COLONOSCOPY  Result Value Ref Range   HM Colonoscopy per patient     Lab Results  Component Value Date   CREATININE 0.86 09/18/2015   Lab Results  Component Value Date   CHOL 219 (H) 09/18/2015   HDL 66.50 09/18/2015   LDLCALC 132 (H) 09/18/2015   TRIG 100.0 09/18/2015   CHOLHDL 3 09/18/2015      Assessment & Plan:   Problem List Items Addressed This Visit    Essential hypertension - Primary    Chronic, stable. Good control based on home readings. Continue current regimen of amlodipine 5mg  daily, with lasix 20mg  PRN.Marland Kitchen      Relevant Medications   ezetimibe (ZETIA) 10 MG tablet   GERD (gastroesophageal reflux disease)    Reviewed PPI, pepcid dosing.       Hyperlipidemia    Continues krill oil, to restart ezetimibe in the new year. Check FLP next labs.       Relevant Medications   ezetimibe (ZETIA) 10 MG tablet   Rash on lips    Lip inflammation of unknown etiology. rec cool compresses. Suggested desonide cream if not improved with cool compresses. Update if persistent.           Follow up plan: Return in about 4 months (around 10/26/2016) for medicare wellness visit.  Ria Bush, MD

## 2016-06-28 ENCOUNTER — Telehealth: Payer: Self-pay | Admitting: *Deleted

## 2016-06-28 NOTE — Telephone Encounter (Signed)
Desonide cream not covered by insurance. Will need to change to triamcinolone or elocon.

## 2016-07-01 MED ORDER — MOMETASONE FUROATE 0.1 % EX CREA: 1.0000 "application " | TOPICAL_CREAM | Freq: Every day | CUTANEOUS | 0 refills | Status: DC

## 2016-07-01 NOTE — Telephone Encounter (Signed)
Med changed

## 2016-07-02 ENCOUNTER — Ambulatory Visit (INDEPENDENT_AMBULATORY_CARE_PROVIDER_SITE_OTHER)
Admission: RE | Admit: 2016-07-02 | Discharge: 2016-07-02 | Disposition: A | Discharge status: Home / Self Care | Payer: Medicare Other | Source: Ambulatory Visit | Attending: Family Medicine | Admitting: Family Medicine

## 2016-07-02 ENCOUNTER — Ambulatory Visit (INDEPENDENT_AMBULATORY_CARE_PROVIDER_SITE_OTHER): Payer: Medicare Other | Admitting: Family Medicine

## 2016-07-02 ENCOUNTER — Encounter: Payer: Self-pay | Admitting: Family Medicine

## 2016-07-02 VITALS — BP 134/66 | HR 70 | Temp 97.8°F | Wt 219.8 lb

## 2016-07-02 DIAGNOSIS — S3992XA Unspecified injury of lower back, initial encounter: Secondary | ICD-10-CM | POA: Diagnosis not present

## 2016-07-02 DIAGNOSIS — M545 Low back pain: Secondary | ICD-10-CM | POA: Diagnosis not present

## 2016-07-02 NOTE — Patient Instructions (Signed)
Great to see you.  We will call you with your xray results.   

## 2016-07-02 NOTE — Progress Notes (Signed)
Pre visit review using our clinic review tool, if applicable. No additional management support is needed unless otherwise documented below in the visit note. 

## 2016-07-02 NOTE — Progress Notes (Signed)
SUBJECTIVE:  Monica Wise is a 71 y.o. female pt of Dr. Darnell Level, new to me, who complains of an injury causing low back pain 4 day(s) ago. The pain is positional with bending or lifting, without radiation down the legs. Mechanism of injury: fell on a hard floor. Symptoms have been constant since that time. Prior history of back problems: previous herniated disc. There is no numbness in the legs.  Current Outpatient Prescriptions on File Prior to Visit  Medication Sig Dispense Refill  . albuterol (PROVENTIL HFA;VENTOLIN HFA) 108 (90 BASE) MCG/ACT inhaler Inhale 1-2 puffs into the lungs every 6 (six) hours as needed for wheezing or shortness of breath.    Marland Kitchen amLODipine (NORVASC) 5 MG tablet Take 1 tablet (5 mg total) by mouth daily. 30 tablet 11  . aspirin 81 MG tablet Take 81 mg by mouth as needed.     Marland Kitchen b complex vitamins tablet Take 1 tablet by mouth as needed.    . budesonide-formoterol (SYMBICORT) 160-4.5 MCG/ACT inhaler 2 puffs twice daily 1 Inhaler 3  . Cholecalciferol (VITAMIN D) 2000 units CAPS Take 1 capsule by mouth daily.    Marland Kitchen ezetimibe (ZETIA) 10 MG tablet Take 1 tablet (10 mg total) by mouth daily. MUST SCHEDULE ANNUAL WELLNESS EXAM 90 tablet 3  . famotidine (PEPCID) 20 MG tablet Take 20 mg by mouth as needed.     . fexofenadine (ALLEGRA) 180 MG tablet Take 180 mg by mouth daily.    . furosemide (LASIX) 20 MG tablet Take 1 tablet (20 mg total) by mouth daily as needed for fluid. 30 tablet 6  . Glucosamine-Chondroitin (MOVE FREE PO) Take by mouth as needed.     . Loperamide HCl (ANTI-DIARRHEAL PO) Take by mouth.    . mometasone (ELOCON) 0.1 % cream Apply 1 application topically daily. 45 g 0  . montelukast (SINGULAIR) 10 MG tablet TAKE 1 TABLET BY MOUTH EVERY DAY IN THE EVENING 30 tablet 11  . Multiple Vitamin (MULTIVITAMIN) tablet Take 1 tablet by mouth daily.    . Omega-3 Krill Oil 500 MG CAPS Take 500 mg by mouth daily.    Marland Kitchen omeprazole (PRILOSEC) 20 MG capsule Take 20 mg by mouth as  needed. Reported on 07/31/2015    . OVER THE COUNTER MEDICATION as needed. Ginsana    . potassium chloride (K-DUR) 10 MEQ tablet Take 1 tablet (10 mEq total) by mouth daily as needed. With lasix 30 tablet 6  . tiotropium (SPIRIVA HANDIHALER) 18 MCG inhalation capsule Place 1 capsule (18 mcg total) into inhaler and inhale daily. 30 capsule 11  . traMADol (ULTRAM) 50 MG tablet Take 1 tablet (50 mg total) by mouth 2 (two) times daily as needed. 40 tablet 0   No current facility-administered medications on file prior to visit.     Allergies  Allergen Reactions  . Altace [Ramipril]     unknown  . Benicar [Olmesartan]     unknown  . Codeine Nausea And Vomiting  . Demerol [Meperidine] Nausea And Vomiting  . Statins Other (See Comments)    Myalgias to crestor, refused further statins  . Sulfa Antibiotics     Unknown reaction per patient    Past Medical History:  Diagnosis Date  . Aortic valve sclerosis 2014   withoout stenosis, mild aortic insufficiency - Dr Ubaldo Glassing  . Asthma   . Chronic diarrhea 2011   worse in am, ?IBS  . COPD (chronic obstructive pulmonary disease) ([place]) 2005   chronic bronchitis - spirometry  FVC 70%, FEV1 72%, abnormal loop volume (unknown date)  . Essential hypertension   . Ex-smoker   . GERD (gastroesophageal reflux disease)   . History of chicken pox   . History of measles   . Hyperlipidemia   . IBS (irritable bowel syndrome)   . Lumbago   . Lump or mass in breast 2012   right breast US guided finesse 1 o'clock  . Obesity   . Osteoarthritis    knees s/p surgeries  . Osteopenia 09/2014   mild by DEXA (-1.2 femur neck)  . Perennial allergic rhinitis     Past Surgical History:  Procedure Laterality Date  . BREAST BIOPSY Right 2012  . BREAST LUMPECTOMY Left 1995   benign mass  . CARDIOVASCULAR STRESS TEST  12/2004   nl myoview, preserved LV fxn with EF 71% without reversible ischemia ([facility])  . COLONOSCOPY  2010   hyperplastic polyps identified. rpt 10  yrs ([facility])  . DEXA  09/2014   T -1.2 femur neck  . ESOPHAGOGASTRODUODENOSCOPY  2010   [facility]  . KNEE SURGERY Right 2010   arthroscopic  . KNEE SURGERY Left 2014    Family History  Problem Relation Age of Onset  . Diverticulitis Mother   . Cancer Maternal Aunt     bone, and unsure  . Heart failure Mother   . CAD Father 55    MI x3  . Sudden death Brother 7    Social History   Social History  . Marital status: Divorced    Spouse name: N/A  . Number of children: N/A  . Years of education: N/A   Occupational History  . Not on file.   Social History Main Topics  . Smoking status: Former Smoker    Packs/day: 1.00    Years: 30.00    Types: Cigarettes    Quit date: 05/31/2008  . Smokeless tobacco: Never Used  . Alcohol use 0.0 oz/week     Comment: ocassionally  . Drug use: No  . Sexual activity: Not on file   Other Topics Concern  . Not on file   Social History Narrative   Lives alone, 2 feral cats   Occupation: retired, worked for [employer]   Edu: TEFL teacher   Activity: works in yard, unable to walk 2/2 knee pain   Diet: good water, fruits/vegetables daily   The PMH, PSH, Social History, Family History, Medications, and allergies have been reviewed in [facility], and have been updated if relevant.  OBJECTIVE: BP 134/66   Pulse 70   Temp 97.8 F (36.6 C) (Oral)   Wt 219 lb 12 oz (99.7 kg)   SpO2 93%   BMI 34.42 kg/m   Vital signs as noted above. Patient appears to be in mild to moderate pain, antalgic gait noted. Lumbosacral spine reveals local tenderness or mass. Painful and reduced LS ROM noted. Straight leg raise is negative  bilateral. DTR's, motor strength and sensation normal, including heel and toe gait.  Peripheral pulses are palpable. Lumbar spine X-Ray: ordered, but results not yet available.   ASSESSMENT:  Concern for compression fx  PLAN: Xray For acute pain, rest, intermittent application of cold packs (later, may switch to heat, but do not  sleep on heating pad), analgesics and muscle relaxants are recommended. Proper lifting with avoidance of heavy lifting discussed.

## 2016-09-09 ENCOUNTER — Encounter: Payer: Self-pay | Admitting: Primary Care

## 2016-09-09 ENCOUNTER — Encounter (INDEPENDENT_AMBULATORY_CARE_PROVIDER_SITE_OTHER): Payer: Self-pay

## 2016-09-09 ENCOUNTER — Ambulatory Visit (INDEPENDENT_AMBULATORY_CARE_PROVIDER_SITE_OTHER): Payer: Medicare Other | Admitting: Primary Care

## 2016-09-09 VITALS — BP 146/70 | HR 73 | Temp 97.8°F | Ht 67.0 in | Wt 219.0 lb

## 2016-09-09 DIAGNOSIS — J069 Acute upper respiratory infection, unspecified: Secondary | ICD-10-CM | POA: Diagnosis not present

## 2016-09-09 MED ORDER — BENZONATATE 200 MG PO CAPS: 200.0000 mg | ORAL_CAPSULE | Freq: Three times a day (TID) | ORAL | 0 refills | Status: DC | PRN

## 2016-09-09 NOTE — Progress Notes (Signed)
Pre visit review using our clinic review tool, if applicable. No additional management support is needed unless otherwise documented below in the visit note. 

## 2016-09-09 NOTE — Progress Notes (Signed)
Subjective:    Patient ID: Monica Wise, female    DOB: Jan 23, 1946, 71 y.o.   MRN: [id number]  HPI  Ms. Glandon is a 71 year old female with a history of COPD, allergic rhinitis, GERD who presents today with a chief complaint of cough. She also reports sore throat, headache, hoarse voice, shortness of breath. Her cough is non productive. Her symptoms began 5 days ago. She's taken Tessalon Pearls, Delsym for cough with improvement. She's not using her albuterol inhaler. She denies fevers. Overall she's feeling about the same.  Review of Systems  Constitutional: Negative for fever.  HENT: Positive for congestion and sinus pressure. Negative for ear pain.   Respiratory: Positive for cough and shortness of breath. Negative for wheezing.   Cardiovascular: Negative for chest pain.  Neurological: Positive for headaches.       Past Medical History:  Diagnosis Date  . Aortic valve sclerosis 2014   withoout stenosis, mild aortic insufficiency - Dr Ubaldo Glassing  . Asthma   . Chronic diarrhea 2011   worse in am, ?IBS  . COPD (chronic obstructive pulmonary disease) ([place]) 2005   chronic bronchitis - spirometry FVC 70%, FEV1 72%, abnormal loop volume (unknown date)  . Essential hypertension   . Ex-smoker   . GERD (gastroesophageal reflux disease)   . History of chicken pox   . History of measles   . Hyperlipidemia   . IBS (irritable bowel syndrome)   . Lumbago   . Lump or mass in breast 2012   right breast US guided finesse 1 o'clock  . Obesity   . Osteoarthritis    knees s/p surgeries  . Osteopenia 09/2014   mild by DEXA (-1.2 femur neck)  . Perennial allergic rhinitis      Social History   Social History  . Marital status: Divorced    Spouse name: N/A  . Number of children: N/A  . Years of education: N/A   Occupational History  . Not on file.   Social History Main Topics  . Smoking status: Former Smoker    Packs/day: 1.00    Years: 30.00    Types: Cigarettes    Quit date: 05/31/2008   . Smokeless tobacco: Never Used  . Alcohol use 0.0 oz/week     Comment: ocassionally  . Drug use: No  . Sexual activity: Not on file   Other Topics Concern  . Not on file   Social History Narrative   Lives alone, 2 feral cats   Occupation: retired, worked for [employer]   Edu: [employer]   Activity: works in yard, unable to walk 2/2 knee pain   Diet: good water, fruits/vegetables daily    Past Surgical History:  Procedure Laterality Date  . BREAST BIOPSY Right 2012  . BREAST LUMPECTOMY Left 1995   benign mass  . CARDIOVASCULAR STRESS TEST  12/2004   nl myoview, preserved LV fxn with EF 71% without reversible ischemia ([facility])  . COLONOSCOPY  2010   hyperplastic polyps identified. rpt 10 yrs ([facility])  . DEXA  09/2014   T -1.2 femur neck  . ESOPHAGOGASTRODUODENOSCOPY  2010   [facility]  . KNEE SURGERY Right 2010   arthroscopic  . KNEE SURGERY Left 2014    Family History  Problem Relation Age of Onset  . Diverticulitis Mother   . Cancer Maternal Aunt     bone, and unsure  . Heart failure Mother   . CAD Father 14    MI x3  .  Sudden death Brother 55    Allergies  Allergen Reactions  . Altace [Ramipril]     unknown  . Benicar [Olmesartan]     unknown  . Codeine Nausea And Vomiting  . Demerol [Meperidine] Nausea And Vomiting  . Statins Other (See Comments)    Myalgias to crestor, refused further statins  . Sulfa Antibiotics     Unknown reaction per patient    Current Outpatient Prescriptions on File Prior to Visit  Medication Sig Dispense Refill  . albuterol (PROVENTIL HFA;VENTOLIN HFA) 108 (90 BASE) MCG/ACT inhaler Inhale 1-2 puffs into the lungs every 6 (six) hours as needed for wheezing or shortness of breath.    Marland Kitchen amLODipine (NORVASC) 5 MG tablet Take 1 tablet (5 mg total) by mouth daily. 30 tablet 11  . aspirin 81 MG tablet Take 81 mg by mouth as needed.     Marland Kitchen b complex vitamins tablet Take 1 tablet by mouth as needed.    . budesonide-formoterol (SYMBICORT)  160-4.5 MCG/ACT inhaler 2 puffs twice daily 1 Inhaler 3  . Cholecalciferol (VITAMIN D) 2000 units CAPS Take 1 capsule by mouth daily.    Marland Kitchen ezetimibe (ZETIA) 10 MG tablet Take 1 tablet (10 mg total) by mouth daily. MUST SCHEDULE ANNUAL WELLNESS EXAM 90 tablet 3  . famotidine (PEPCID) 20 MG tablet Take 20 mg by mouth as needed.     . fexofenadine (ALLEGRA) 180 MG tablet Take 180 mg by mouth daily.    . furosemide (LASIX) 20 MG tablet Take 1 tablet (20 mg total) by mouth daily as needed for fluid. 30 tablet 6  . Glucosamine-Chondroitin (MOVE FREE PO) Take by mouth as needed.     . Loperamide HCl (ANTI-DIARRHEAL PO) Take by mouth.    . mometasone (ELOCON) 0.1 % cream Apply 1 application topically daily. 45 g 0  . montelukast (SINGULAIR) 10 MG tablet TAKE 1 TABLET BY MOUTH EVERY DAY IN THE EVENING 30 tablet 11  . Multiple Vitamin (MULTIVITAMIN) tablet Take 1 tablet by mouth daily.    . Omega-3 Krill Oil 500 MG CAPS Take 500 mg by mouth daily.    Marland Kitchen omeprazole (PRILOSEC) 20 MG capsule Take 20 mg by mouth as needed. Reported on 07/31/2015    . OVER THE COUNTER MEDICATION as needed. Ginsana    . potassium chloride (K-DUR) 10 MEQ tablet Take 1 tablet (10 mEq total) by mouth daily as needed. With lasix 30 tablet 6  . tiotropium (SPIRIVA HANDIHALER) 18 MCG inhalation capsule Place 1 capsule (18 mcg total) into inhaler and inhale daily. 30 capsule 11  . traMADol (ULTRAM) 50 MG tablet Take 1 tablet (50 mg total) by mouth 2 (two) times daily as needed. (Patient not taking: Reported on 09/09/2016) 40 tablet 0   No current facility-administered medications on file prior to visit.     BP (!) 146/70   Pulse 73   Temp 97.8 F (36.6 C) (Oral)   Ht 5\' 7"  (1.702 m)   Wt 219 lb (99.3 kg)   SpO2 93%   BMI 34.30 kg/m    Objective:   Physical Exam  Constitutional: She appears well-nourished. She does not appear ill.  HENT:  Right Ear: Tympanic membrane and ear canal normal.  Left Ear: Tympanic membrane and  ear canal normal.  Nose: Mucosal edema present. Right sinus exhibits no maxillary sinus tenderness and no frontal sinus tenderness. Left sinus exhibits maxillary sinus tenderness. Left sinus exhibits no frontal sinus tenderness.  Mouth/Throat: Oropharynx is clear  and moist.  Eyes: Conjunctivae are normal.  Neck: Neck supple.  Cardiovascular: Normal rate and regular rhythm.   Pulmonary/Chest: Effort normal and breath sounds normal. She has no wheezes. She has no rales.  Lymphadenopathy:    She has no cervical adenopathy.  Skin: Skin is warm and dry.          Assessment & Plan:  URI:  Cough, sinus pressure, SOB x 5 days. Improvement with OTC and Rx cough suppressants. Exam today with clear lungs, is in no distress, doesn't appear sickly.  Suspect viral URI and will treat with supportive measures. Refill provided for tessalon pearls. Discussed Flonase and Allegra. Start use of albuterol inhaler PRN for SOB. Return precautions provided.  Sheral Flow, NP

## 2016-09-09 NOTE — Patient Instructions (Signed)
Your symptoms are representative of a viral illness which will resolve on its own over time. Our goal is to treat your symptoms in order to aid your body in the healing process and to make you more comfortable.   You may take Benzonatate capsules for cough. Take 1 capsule by mouth three times daily as needed for cough.  Cough/Shortness of Breath:Use your albuterol inhaler. Inhale 2 puffs into the lungs every 6 to 8 hours as needed for wheezing and/or shortness of breath.   Cough/Congestion: Try taking Mucinex DM. This will help loosen up the mucous in your chest. Ensure you take this medication with a full glass of water.  Throat Drainage: Consider trying Allegra. This will help to dry up the drainage to the back of your throat.  Nasal Congestion/Sinus Pressure: Try using Flonase (fluticasone) nasal spray. Instill 1 spray in each nostril twice daily.   Please notify me if you develop persistent fevers of 101, start coughing up green mucous, notice increased fatigue or weakness, or feel worse after 1 week of onset of symptoms.   Increase consumption of water intake and rest.  It was a pleasure to see you today!   Upper Respiratory Infection, Adult Most upper respiratory infections (URIs) are a viral infection of the air passages leading to the lungs. A URI affects the nose, throat, and upper air passages. The most common type of URI is nasopharyngitis and is typically referred to as "the common cold." URIs run their course and usually go away on their own. Most of the time, a URI does not require medical attention, but sometimes a bacterial infection in the upper airways can follow a viral infection. This is called a secondary infection. Sinus and middle ear infections are common types of secondary upper respiratory infections. Bacterial pneumonia can also complicate a URI. A URI can worsen asthma and chronic obstructive pulmonary disease (COPD). Sometimes, these complications can require  emergency medical care and may be life threatening. What are the causes? Almost all URIs are caused by viruses. A virus is a type of germ and can spread from one person to another. What increases the risk? You may be at risk for a URI if:  You smoke.  You have chronic heart or lung disease.  You have a weakened defense (immune) system.  You are very young or very old.  You have nasal allergies or asthma.  You work in crowded or poorly ventilated areas.  You work in health care facilities or schools. What are the signs or symptoms? Symptoms typically develop 2-3 days after you come in contact with a cold virus. Most viral URIs last 7-10 days. However, viral URIs from the influenza virus (flu virus) can last 14-18 days and are typically more severe. Symptoms may include:  Runny or stuffy (congested) nose.  Sneezing.  Cough.  Sore throat.  Headache.  Fatigue.  Fever.  Loss of appetite.  Pain in your forehead, behind your eyes, and over your cheekbones (sinus pain).  Muscle aches. How is this diagnosed? Your health care provider may diagnose a URI by:  Physical exam.  Tests to check that your symptoms are not due to another condition such as:  Strep throat.  Sinusitis.  Pneumonia.  Asthma. How is this treated? A URI goes away on its own with time. It cannot be cured with medicines, but medicines may be prescribed or recommended to relieve symptoms. Medicines may help:  Reduce your fever.  Reduce your cough.  Relieve nasal  congestion. Follow these instructions at home:  Take medicines only as directed by your health care provider.  Gargle warm saltwater or take cough drops to comfort your throat as directed by your health care provider.  Use a warm mist humidifier or inhale steam from a shower to increase air moisture. This may make it easier to breathe.  Drink enough fluid to keep your urine clear or pale yellow.  Eat soups and other clear broths  and maintain good nutrition.  Rest as needed.  Return to work when your temperature has returned to normal or as your health care provider advises. You may need to stay home longer to avoid infecting others. You can also use a face mask and careful hand washing to prevent spread of the virus.  Increase the usage of your inhaler if you have asthma.  Do not use any tobacco products, including cigarettes, chewing tobacco, or electronic cigarettes. If you need help quitting, ask your health care provider. How is this prevented? The best way to protect yourself from getting a cold is to practice good hygiene.  Avoid oral or hand contact with people with cold symptoms.  Wash your hands often if contact occurs. There is no clear evidence that vitamin C, vitamin E, echinacea, or exercise reduces the chance of developing a cold. However, it is always recommended to get plenty of rest, exercise, and practice good nutrition. Contact a health care provider if:  You are getting worse rather than better.  Your symptoms are not controlled by medicine.  You have chills.  You have worsening shortness of breath.  You have brown or red mucus.  You have yellow or brown nasal discharge.  You have pain in your face, especially when you bend forward.  You have a fever.  You have swollen neck glands.  You have pain while swallowing.  You have white areas in the back of your throat. Get help right away if:  You have severe or persistent:  Headache.  Ear pain.  Sinus pain.  Chest pain.  You have chronic lung disease and any of the following:  Wheezing.  Prolonged cough.  Coughing up blood.  A change in your usual mucus.  You have a stiff neck.  You have changes in your:  Vision.  Hearing.  Thinking.  Mood. This information is not intended to replace advice given to you by your health care provider. Make sure you discuss any questions you have with your health care  provider. Document Released: 12/11/2000 Document Revised: 02/18/2016 Document Reviewed: 09/22/2013 Elsevier Interactive Patient Education  2017 [employer].

## 2016-09-10 ENCOUNTER — Telehealth: Payer: Self-pay

## 2016-09-10 NOTE — Telephone Encounter (Signed)
Pt said the tramadol is not helping back pain, sciatica and both knees hurt. Pt said when tramadol was prescribed discussed also pt taking hydrocodone but pt did not get rx; pt wanted to try tramadol first. Pt request hydrocodone. Call when ready for pick up. Pt last seen by Dr Darnell Level on 06/27/16. Pt wanted to wait on Dr Synthia Innocent return on 09/11/16 to request med.

## 2016-09-11 MED ORDER — HYDROCODONE-ACETAMINOPHEN 5-325 MG PO TABS: 1.0000 | ORAL_TABLET | Freq: Four times a day (QID) | ORAL | 0 refills | Status: DC | PRN

## 2016-09-11 NOTE — Telephone Encounter (Signed)
Pt was notified of instructions, pt verbalized understanding.   

## 2016-09-11 NOTE — Telephone Encounter (Signed)
Tramadol #40 Rx 08/2015 - no longer effective.  Printed and in [place]' box. Start at 1/2 tab at a time.  Update [place] with effect. Return for re-eval if no better.

## 2016-10-03 ENCOUNTER — Ambulatory Visit (INDEPENDENT_AMBULATORY_CARE_PROVIDER_SITE_OTHER): Payer: Medicare Other

## 2016-10-03 ENCOUNTER — Other Ambulatory Visit: Payer: Self-pay | Admitting: Family Medicine

## 2016-10-03 VITALS — BP 140/90 | HR 64 | Temp 97.8°F | Ht 64.0 in | Wt 217.2 lb

## 2016-10-03 DIAGNOSIS — Z0000 Encounter for general adult medical examination without abnormal findings: Secondary | ICD-10-CM | POA: Diagnosis not present

## 2016-10-03 DIAGNOSIS — Z23 Encounter for immunization: Secondary | ICD-10-CM

## 2016-10-03 DIAGNOSIS — E785 Hyperlipidemia, unspecified: Secondary | ICD-10-CM

## 2016-10-03 LAB — TSH: TSH: 1.29 u[IU]/mL (ref 0.35–4.50)

## 2016-10-03 LAB — BASIC METABOLIC PANEL
BUN: 16 mg/dL (ref 6–23)
CO2: 30 mEq/L (ref 19–32)
CREATININE: 0.89 mg/dL (ref 0.40–1.20)
Calcium: 9.7 mg/dL (ref 8.4–10.5)
Chloride: 104 mEq/L (ref 96–112)
GFR: 66.16 mL/min (ref >60.00)
GLUCOSE: 108 mg/dL — AB (ref 70–99)
Potassium: 4.2 mEq/L (ref 3.5–5.1)
Sodium: 140 mEq/L (ref 135–145)

## 2016-10-03 LAB — LIPID PANEL
Cholesterol: 210 mg/dL — ABNORMAL HIGH (ref 0–200)
HDL: 57.6 mg/dL (ref >39.00)
LDL Cholesterol: 127 mg/dL — ABNORMAL HIGH (ref 0–99)
NonHDL: 151.97
TRIGLYCERIDES: 124 mg/dL (ref 0.0–149.0)
Total CHOL/HDL Ratio: 4
VLDL: 24.8 mg/dL (ref 0.0–40.0)

## 2016-10-03 NOTE — Progress Notes (Signed)
Subjective:   Monica Wise is a 71 y.o. female who presents for Medicare Annual (Subsequent) preventive examination.  Review of Systems:  N/A Cardiac Risk Factors include: advanced age (>77men, >56 women);obesity (BMI >30kg/m2);hypertension     Objective:     Vitals: BP 140/90 (BP Location: Right Arm, Patient Position: Sitting, Cuff Size: Normal)   Pulse 64   Temp 97.8 F (36.6 C) (Oral)   Ht 5\' 4"  (1.626 m)   Wt 217 lb 4 oz (98.5 kg)   SpO2 92%   BMI 37.29 kg/m   Body mass index is 37.29 kg/m.   Tobacco History  Smoking Status  . Former Smoker  . Packs/day: 1.00  . Years: 30.00  . Types: Cigarettes  . Quit date: 05/31/2008  Smokeless Tobacco  . Never Used     Counseling given: No   Past Medical History:  Diagnosis Date  . Aortic valve sclerosis 2014   withoout stenosis, mild aortic insufficiency - Dr Ubaldo Glassing  . Asthma   . Chronic diarrhea 2011   worse in am, ?IBS  . COPD (chronic obstructive pulmonary disease) ([place]) 2005   chronic bronchitis - spirometry FVC 70%, FEV1 72%, abnormal loop volume (unknown date)  . Essential hypertension   . Ex-smoker   . GERD (gastroesophageal reflux disease)   . History of chicken pox   . History of measles   . Hyperlipidemia   . IBS (irritable bowel syndrome)   . Lumbago   . Lump or mass in breast 2012   right breast US guided finesse 1 o'clock  . Obesity   . Osteoarthritis    knees s/p surgeries  . Osteopenia 09/2014   mild by DEXA (-1.2 femur neck)  . Perennial allergic rhinitis    Past Surgical History:  Procedure Laterality Date  . BREAST BIOPSY Right 2012  . BREAST LUMPECTOMY Left 1995   benign mass  . CARDIOVASCULAR STRESS TEST  12/2004   nl myoview, preserved LV fxn with EF 71% without reversible ischemia ([facility])  . COLONOSCOPY  2010   hyperplastic polyps identified. rpt 10 yrs ([facility])  . DEXA  09/2014   T -1.2 femur neck  . ESOPHAGOGASTRODUODENOSCOPY  2010   [facility]  . KNEE SURGERY Right 2010   arthroscopic  . KNEE SURGERY Left 2014   Family History  Problem Relation Age of Onset  . Diverticulitis Mother   . Heart failure Mother   . Cancer Maternal Aunt     bone, and unsure  . CAD Father 10    MI x3  . Sudden death Brother 95   History  Sexual Activity  . Sexual activity: Not on file    Outpatient Encounter Prescriptions as of 10/03/2016  Medication Sig  . albuterol (PROVENTIL HFA;VENTOLIN HFA) 108 (90 BASE) MCG/ACT inhaler Inhale 1-2 puffs into the lungs every 6 (six) hours as needed for wheezing or shortness of breath.  Marland Kitchen amLODipine (NORVASC) 5 MG tablet Take 1 tablet (5 mg total) by mouth daily.  Marland Kitchen aspirin 81 MG tablet Take 81 mg by mouth as needed.   Marland Kitchen b complex vitamins tablet Take 1 tablet by mouth as needed.  . budesonide-formoterol (SYMBICORT) 160-4.5 MCG/ACT inhaler 2 puffs twice daily  . CETIRIZINE HCL PO Take 1 tablet by mouth as needed.  . Cholecalciferol (VITAMIN D) 2000 units CAPS Take 1 capsule by mouth daily.  Marland Kitchen ezetimibe (ZETIA) 10 MG tablet Take 1 tablet (10 mg total) by mouth daily. MUST SCHEDULE ANNUAL WELLNESS EXAM  .  famotidine (PEPCID) 20 MG tablet Take 20 mg by mouth as needed.   . furosemide (LASIX) 20 MG tablet Take 1 tablet (20 mg total) by mouth daily as needed for fluid.  Marland Kitchen HYDROcodone-acetaminophen (NORCO/VICODIN) 5-325 MG tablet Take 1 tablet by mouth every 6 (six) hours as needed for moderate pain.  . Loperamide HCl (ANTI-DIARRHEAL PO) Take by mouth.  . mometasone (ELOCON) 0.1 % cream Apply 1 application topically daily.  . montelukast (SINGULAIR) 10 MG tablet TAKE 1 TABLET BY MOUTH EVERY DAY IN THE EVENING  . Multiple Vitamin (MULTIVITAMIN) tablet Take 1 tablet by mouth daily.  . Omega-3 Krill Oil 500 MG CAPS Take 500 mg by mouth daily.  Marland Kitchen omeprazole (PRILOSEC) 20 MG capsule Take 20 mg by mouth as needed. Reported on 07/31/2015  . potassium chloride (K-DUR) 10 MEQ tablet Take 1 tablet (10 mEq total) by mouth daily as needed. With lasix  .  tiotropium (SPIRIVA HANDIHALER) 18 MCG inhalation capsule Place 1 capsule (18 mcg total) into inhaler and inhale daily.  . [DISCONTINUED] benzonatate (TESSALON) 200 MG capsule Take 1 capsule (200 mg total) by mouth 3 (three) times daily as needed for cough.  . [DISCONTINUED] fexofenadine (ALLEGRA) 180 MG tablet Take 180 mg by mouth daily.  . [DISCONTINUED] Glucosamine-Chondroitin (MOVE FREE PO) Take by mouth as needed.   Marland Kitchen OVER THE COUNTER MEDICATION as needed. Ginsana   No facility-administered encounter medications on file as of 10/03/2016.     Activities of Daily Living In your present state of health, do you have any difficulty performing the following activities: 10/03/2016  Hearing? N  Vision? N  Difficulty concentrating or making decisions? Y  Walking or climbing stairs? Y  Dressing or bathing? N  Doing errands, shopping? N  Preparing Food and eating ? N  Using the Toilet? N  In the past six months, have you accidently leaked urine? N  Do you have problems with loss of bowel control? N  Managing your Medications? N  Managing your Finances? N  Housekeeping or managing your Housekeeping? N  Some recent data might be hidden    Patient Care Team: Ria Bush, MD as PCP - General (Family Medicine) Robert Bellow, MD (General Surgery) Lorelee [place], MD (Family Medicine)    Assessment:    Exercise Activities and Dietary recommendations Current Exercise Habits: The patient does not participate in regular exercise at present (pt does yard work), Exercise limited by: orthopedic condition(s)  Goals    . safety          Starting 10/03/16, I will continue to use cane as needed to reduce risk of falls.       Fall Risk Fall Risk  10/03/2016 09/26/2015 09/19/2014 09/19/2014  Falls in the past year? Yes No Yes No  Number falls in past yr: 1 - 1 -  Injury with Fall? Yes - No -   Depression Screen PHQ 2/9 Scores 10/03/2016 09/26/2015 09/19/2014  PHQ - 2 Score 1 0 0      Cognitive Function MMSE - Mini Mental State Exam 10/03/2016  Orientation to time 5  Orientation to Place 5  Registration 3  Attention/ Calculation 0  Recall 3  Language- name 2 objects 0  Language- repeat 1  Language- follow 3 step command 3  Language- read & follow direction 0  Write a sentence 0  Copy design 0  Total score 20       PLEASE NOTE: A Mini-Cog screen was completed. Maximum score is 20.  A value of 0 denotes this part of [place] MMSE was not completed or the patient failed this part of the Mini-Cog screening.   Mini-Cog Screening Orientation to Time - Max 5 pts Orientation to Place - Max 5 pts Registration - Max 3 pts Recall - Max 3 pts Language Repeat - Max 1 pts Language Follow 3 Step Command - Max 3 pts   Immunization History  Administered Date(s) Administered  . Influenza-Unspecified 03/31/2014, 03/02/2015, 05/22/2016  . Pneumococcal Conjugate-13 10/03/2015  . Pneumococcal Polysaccharide-23 10/03/2016  . Pneumococcal-Unspecified 07/02/1999  . Zoster 07/04/2014   Screening Tests Health Maintenance  Topic Date Due  . DTaP/Tdap/Td (1 - Tdap) 10/03/2017 (Originally 03/10/1965)  . TETANUS/TDAP  10/03/2017 (Originally 03/10/1965)  . INFLUENZA VACCINE  01/29/2017  . MAMMOGRAM  05/27/2018  . COLONOSCOPY  07/01/2018  . DEXA SCAN  Completed  . Hepatitis C Screening  Completed  . PNA vac Low Risk Adult  Completed      Plan:     I have personally reviewed and addressed the Medicare Annual Wellness questionnaire and have noted the following in the patient's chart:  A. Medical and social history B. Use of alcohol, tobacco or illicit drugs  C. Current medications and supplements D. Functional ability and status E.  Nutritional status F.  Physical activity G. Advance directives H. List of other physicians I.  Hospitalizations, surgeries, and ER visits in previous 12 months J.  [place] to include hearing, vision, cognitive,  depression L. Referrals and appointments - none  In addition, I have reviewed and discussed with patient certain preventive protocols, quality metrics, and best practice recommendations. A written personalized care plan for preventive services as well as general preventive health recommendations were provided to patient.  See attached scanned questionnaire for additional information.   Signed,   Lindell Noe, MHA, BS, LPN Health Coach

## 2016-10-03 NOTE — Patient Instructions (Signed)
Monica Wise , Thank you for taking time to come for your Medicare Wellness Visit. I appreciate your ongoing commitment to your health goals. Please review the following plan we discussed and let me know if I can assist you in the future.   These are the goals we discussed: Goals    . safety          Starting 10/03/16, I will continue to use cane as needed to reduce risk of falls.        This is a list of the screening recommended for you and due dates:  Health Maintenance  Topic Date Due  . DTaP/Tdap/Td vaccine (1 - Tdap) 10/03/2017*  . Tetanus Vaccine  10/03/2017*  . Flu Shot  01/29/2017  . Mammogram  05/27/2018  . Colon Cancer Screening  07/01/2018  . DEXA scan (bone density measurement)  Completed  .  Hepatitis C: One time screening is recommended by Center for Disease Control  (CDC) for  adults born from [birth year] through 1967.   Completed  . Pneumonia vaccines  Completed  *Topic was postponed. The date shown is not the original due date.   Preventive Care for Adults  A healthy lifestyle and preventive care can promote health and wellness. Preventive health guidelines for adults include the following key practices.  . A routine yearly physical is a good way to check with your health care provider about your health and preventive screening. It is a chance to share any concerns and updates on your health and to receive a thorough exam.  . Visit your dentist for a routine exam and preventive care every 6 months. Brush your teeth twice a day and floss once a day. Good oral hygiene prevents tooth decay and gum disease.  . The frequency of eye exams is based on your age, health, family medical history, use  of contact lenses, and other factors. Follow your health care provider's ecommendations for frequency of eye exams.  . Eat a healthy diet. Foods like vegetables, fruits, whole grains, low-fat dairy products, and lean protein foods contain the nutrients you need without too many calories.  Decrease your intake of foods high in solid fats, added sugars, and salt. Eat the right amount of calories for you. Get information about a proper diet from your health care provider, if necessary.  . Regular physical exercise is one of the most important things you can do for your health. Most adults should get at least 150 minutes of moderate-intensity exercise (any activity that increases your heart rate and causes you to sweat) each week. In addition, most adults need muscle-strengthening exercises on 2 or more days a week.  Silver Sneakers may be a benefit available to you. To determine eligibility, you may visit the website: www.silversneakers.com or contact program at 908-640-5789 Mon-Fri between 8AM-8PM.   . Maintain a healthy weight. The body mass index (BMI) is a screening tool to identify possible weight problems. It provides an estimate of body fat based on height and weight. Your health care provider can find your BMI and can help you achieve or maintain a healthy weight.   For adults 20 years and older: ? A BMI below 18.5 is considered underweight. ? A BMI of 18.5 to 24.9 is normal. ? A BMI of 25 to 29.9 is considered overweight. ? A BMI of 30 and above is considered obese.   . Maintain normal blood lipids and cholesterol levels by exercising and minimizing your intake of saturated fat. Eat a  balanced diet with plenty of fruit and vegetables. Blood tests for lipids and cholesterol should begin at age 27 and be repeated every 5 years. If your lipid or cholesterol levels are high, you are over 50, or you are at high risk for heart disease, you may need your cholesterol levels checked more frequently. Ongoing high lipid and cholesterol levels should be treated with medicines if diet and exercise are not working.  . If you smoke, find out from your health care provider how to quit. If you do not use tobacco, please do not start.  . If you choose to drink alcohol, please do not consume  more than 2 drinks per day. One drink is considered to be 12 ounces (355 mL) of beer, 5 ounces (148 mL) of wine, or 1.5 ounces (44 mL) of liquor.  . If you are 52-59 years old, ask your health care provider if you should take aspirin to prevent strokes.  . Use sunscreen. Apply sunscreen liberally and repeatedly throughout the day. You should seek shade when your shadow is shorter than you. Protect yourself by wearing long sleeves, pants, a wide-brimmed hat, and sunglasses year round, whenever you are outdoors.  . Once a month, do a whole body skin exam, using a mirror to look at the skin on your back. Tell your health care provider of new moles, moles that have irregular borders, moles that are larger than a pencil eraser, or moles that have changed in shape or color.

## 2016-10-03 NOTE — Progress Notes (Signed)
Pre visit review using our clinic review tool, if applicable. No additional management support is needed unless otherwise documented below in the visit note. 

## 2016-10-03 NOTE — Progress Notes (Signed)
PCP notes:   Health maintenance:  Tetanus - addressed; pt will contact insurance regarding coverage  PPSV23 - administered  Abnormal screenings:   Fall risk - hx of fall with injury Depression score: 1  Patient concerns:   Pt reports pain in both knees. Pain scale: 4/10.  Nurse concerns:  None  Next PCP appt:   10/08/16 @ 1500

## 2016-10-06 NOTE — Progress Notes (Signed)
I reviewed health advisor's note, was available for consultation, and agree with documentation and plan.  

## 2016-10-08 ENCOUNTER — Ambulatory Visit (INDEPENDENT_AMBULATORY_CARE_PROVIDER_SITE_OTHER): Payer: Medicare Other | Admitting: Family Medicine

## 2016-10-08 ENCOUNTER — Encounter: Payer: Self-pay | Admitting: Family Medicine

## 2016-10-08 VITALS — BP 136/82 | HR 72 | Temp 97.9°F | Wt 217.0 lb

## 2016-10-08 DIAGNOSIS — Z0000 Encounter for general adult medical examination without abnormal findings: Secondary | ICD-10-CM

## 2016-10-08 DIAGNOSIS — J449 Chronic obstructive pulmonary disease, unspecified: Secondary | ICD-10-CM | POA: Diagnosis not present

## 2016-10-08 DIAGNOSIS — E669 Obesity, unspecified: Secondary | ICD-10-CM | POA: Diagnosis not present

## 2016-10-08 DIAGNOSIS — M170 Bilateral primary osteoarthritis of knee: Secondary | ICD-10-CM

## 2016-10-08 DIAGNOSIS — I10 Essential (primary) hypertension: Secondary | ICD-10-CM

## 2016-10-08 DIAGNOSIS — R0989 Other specified symptoms and signs involving the circulatory and respiratory systems: Secondary | ICD-10-CM | POA: Diagnosis not present

## 2016-10-08 DIAGNOSIS — Z87891 Personal history of nicotine dependence: Secondary | ICD-10-CM | POA: Diagnosis not present

## 2016-10-08 DIAGNOSIS — E785 Hyperlipidemia, unspecified: Secondary | ICD-10-CM

## 2016-10-08 DIAGNOSIS — IMO0001 Reserved for inherently not codable concepts without codable children: Secondary | ICD-10-CM

## 2016-10-08 DIAGNOSIS — Z7189 Other specified counseling: Secondary | ICD-10-CM

## 2016-10-08 NOTE — Assessment & Plan Note (Addendum)
Again reviewed with patient - son is POA. Has also discussed with local cousin. Declines bringing copy today.

## 2016-10-08 NOTE — Progress Notes (Signed)
BP 136/82   Pulse 72   Temp 97.9 F (36.6 C) (Oral)   Wt 217 lb (98.4 kg)   BMI 37.25 kg/m    CC: CPE Subjective:    Patient ID: Monica Wise, female    DOB: 07-08-1945, 71 y.o.   MRN: [id number]  HPI: Monica Wise is a 71 y.o. female presenting on 10/08/2016 for Annual Exam   Saw Katha Cabal last week for medicare wellness visit. Note reviewed.    ?white coat hypertension - current regimen is amlodipine 5mg  daily with lasix 20mg /K-dur PRN.   Known knee arthritis - oxycodone 5mg  was ineffective. Tramadol lost effectiveness. 08/2016 we started hydrocodone 5/325mg  1 tablet and this is helping.   Preventative: G1P1 COLONOSCOPY Date: 2010 hyperplastic polyps identified. rpt 10 yrs ([facility]) Mammogram 05/2016 birads 2.  Well woman exam - remotely. Discussed, declines further eval.  Lung cancer screening - discussed. Eligible.  DEXA Date: 09/2014 T -1.2 femur neck  Flu shot - yearly Pneumovax 2001, prevnar 09/2015 zostavax 07/2014 Advanced directive: Again reviewed with patient - son is POA. Has also discussed with local cousin. Declines bringing copy today.  Seat belt use discussed  Sunscreen use discussed. No changing moles on skin  Ex smoker - quit 2009. 30 PY hx. Alcohol - occasional  Lives alone, 2 feral cats Occupation: retired, worked for [employer]: [employer] Activity: works in yard, unable to walk 2/2 knee pain Diet: good water, fruits/vegetables daily  Relevant past medical, surgical, family and social history reviewed and updated as indicated. Interim medical history since our last visit reviewed. Allergies and medications reviewed and updated. Outpatient Medications Prior to Visit  Medication Sig Dispense Refill  . albuterol (PROVENTIL HFA;VENTOLIN HFA) 108 (90 BASE) MCG/ACT inhaler Inhale 1-2 puffs into the lungs every 6 (six) hours as needed for wheezing or shortness of breath.    Marland Kitchen amLODipine (NORVASC) 5 MG tablet Take 1 tablet (5 mg total) by mouth daily. 30  tablet 11  . aspirin 81 MG tablet Take 81 mg by mouth as needed.     Marland Kitchen b complex vitamins tablet Take 1 tablet by mouth as needed.    . budesonide-formoterol (SYMBICORT) 160-4.5 MCG/ACT inhaler 2 puffs twice daily 1 Inhaler 3  . CETIRIZINE HCL PO Take 1 tablet by mouth as needed.    . Cholecalciferol (VITAMIN D) 2000 units CAPS Take 1 capsule by mouth daily.    Marland Kitchen ezetimibe (ZETIA) 10 MG tablet Take 1 tablet (10 mg total) by mouth daily. MUST SCHEDULE ANNUAL WELLNESS EXAM 90 tablet 3  . famotidine (PEPCID) 20 MG tablet Take 20 mg by mouth as needed.     . furosemide (LASIX) 20 MG tablet Take 1 tablet (20 mg total) by mouth daily as needed for fluid. 30 tablet 6  . HYDROcodone-acetaminophen (NORCO/VICODIN) 5-325 MG tablet Take 1 tablet by mouth every 6 (six) hours as needed for moderate pain. 20 tablet 0  . Loperamide HCl (ANTI-DIARRHEAL PO) Take by mouth.    . mometasone (ELOCON) 0.1 % cream Apply 1 application topically daily. 45 g 0  . montelukast (SINGULAIR) 10 MG tablet TAKE 1 TABLET BY MOUTH EVERY DAY IN THE EVENING 30 tablet 11  . Multiple Vitamin (MULTIVITAMIN) tablet Take 1 tablet by mouth daily.    . Omega-3 Krill Oil 500 MG CAPS Take 500 mg by mouth daily.    Marland Kitchen omeprazole (PRILOSEC) 20 MG capsule Take 20 mg by mouth as needed. Reported on 07/31/2015    .  OVER THE COUNTER MEDICATION as needed. Ginsana    . potassium chloride (K-DUR) 10 MEQ tablet Take 1 tablet (10 mEq total) by mouth daily as needed. With lasix 30 tablet 6  . tiotropium (SPIRIVA HANDIHALER) 18 MCG inhalation capsule Place 1 capsule (18 mcg total) into inhaler and inhale daily. 30 capsule 11   No facility-administered medications prior to visit.      Per HPI unless specifically indicated in ROS section below Review of Systems  Constitutional: Negative for activity change, appetite change, chills, fatigue, fever and unexpected weight change.  HENT: Negative for hearing loss.   Eyes: Negative for visual disturbance.    Respiratory: Positive for shortness of breath (mild). Negative for cough, chest tightness and wheezing.   Cardiovascular: Negative for chest pain, palpitations and leg swelling.  Gastrointestinal: Positive for abdominal pain (mild ?IBS related). Negative for abdominal distention, blood in stool, constipation, diarrhea, nausea and vomiting.  Genitourinary: Negative for difficulty urinating and hematuria.  Musculoskeletal: Negative for arthralgias, myalgias and neck pain.  Skin: Negative for rash.  Neurological: Negative for dizziness, seizures, syncope and headaches.  Hematological: Negative for adenopathy. Bruises/bleeds easily.  Psychiatric/Behavioral: Negative for dysphoric mood. The patient is not nervous/anxious.        Objective:    BP 136/82   Pulse 72   Temp 97.9 F (36.6 C) (Oral)   Wt 217 lb (98.4 kg)   BMI 37.25 kg/m   Wt Readings from Last 3 Encounters:  10/08/16 217 lb (98.4 kg)  10/03/16 217 lb 4 oz (98.5 kg)  09/09/16 219 lb (99.3 kg)    Physical Exam  Constitutional: She is oriented to person, place, and time. She appears well-developed and well-nourished. No distress.  HENT:  Head: Normocephalic and atraumatic.  Right Ear: Hearing, tympanic membrane, external ear and ear canal normal.  Left Ear: Hearing, tympanic membrane, external ear and ear canal normal.  Nose: Nose normal.  Mouth/Throat: Uvula is midline, oropharynx is clear and moist and mucous membranes are normal. No oropharyngeal exudate, posterior oropharyngeal edema or posterior oropharyngeal erythema.  Eyes: Conjunctivae and EOM are normal. Pupils are equal, round, and reactive to light. No scleral icterus.  Neck: Normal range of motion. Neck supple. Carotid bruit is present (bilat - ? 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 No thyromegaly present.  Cardiovascular: Normal rate, regular rhythm and intact distal pulses.   Murmur (3/6 SEM best at RUSB) heard. Pulses:      Radial pulses are 2+ on the right  side, and 2+ on the left side.  ?R subclavian bruit  Pulmonary/Chest: Effort normal and breath sounds normal. No respiratory distress. She has no wheezes. She has no rales.  Abdominal: Soft. Bowel sounds are normal. She exhibits no distension and no mass. There is no tenderness. There is no rebound and no guarding.  Musculoskeletal: Normal range of motion. She exhibits no edema.  Lymphadenopathy:    She has no cervical adenopathy.  Neurological: She is alert and oriented to person, place, and time.  CN grossly intact, station and gait intact  Skin: Skin is warm and dry. No rash noted.  Psychiatric: She has a normal mood and affect. Her behavior is normal. Judgment and thought content normal.  Nursing note and vitals reviewed.  Results for orders placed or performed in visit on 10/03/16  Lipid panel  Result Value Ref Range   Cholesterol 210 (H) 0 - 200 mg/dL   Triglycerides 124.0 0.0 - 149.0 mg/dL   HDL 57.60 >39.00 mg/dL  VLDL 24.8 0.0 - 40.0 mg/dL   LDL Cholesterol 127 (H) 0 - 99 mg/dL   Total CHOL/HDL Ratio 4    NonHDL 151.97   TSH  Result Value Ref Range   TSH 1.29 0.35 - 4.50 uIU/mL  Basic metabolic panel  Result Value Ref Range   Sodium 140 135 - 145 mEq/L   Potassium 4.2 3.5 - 5.1 mEq/L   Chloride 104 96 - 112 mEq/L   CO2 30 19 - 32 mEq/L   Glucose, Bld 108 (H) 70 - 99 mg/dL   BUN 16 6 - 23 mg/dL   Creatinine, Ser 0.89 0.40 - 1.20 mg/dL   Calcium 9.7 8.4 - 10.5 mg/dL   GFR 66.16 >60.00 mL/min      Assessment & Plan:   Problem List Items Addressed This Visit    Advanced care planning/counseling discussion    Again reviewed with patient - son is POA. Has also discussed with local cousin. Declines bringing copy today.       COPD (chronic obstructive pulmonary disease) (HCC)    Stable period on symbicort and spiriva. Quit smoking 2009      Essential hypertension    Chronic, stable. Improved readings today. Continue current regimen.       Ex-smoker    Remains  abstinent - congratulated. Will order lung cancer screening CT in [place].       Relevant Orders   Ambulatory Referral for Lung Cancer Scre   Health maintenance examination - Primary    Preventative protocols reviewed and updated unless pt declined. Discussed healthy diet and lifestyle.       Hyperlipidemia    Chronic, mildly elevated. Continue zetia and krill oil. Intolerant to statins.       Obesity, Class II, BMI 35-39.9, with comorbidity (HCC)    Encouraged healthy diet to affect sustainable weight loss.       Osteoarthritis    Recent flare of knee pain - doing well on hydrocodone PRN       Other Visit Diagnoses    Bilateral carotid bruits       Relevant Orders   VAS US CAROTID       Follow up plan: Return in about 1 year (around 10/08/2017) for annual exam, prior fasting for blood work, medicare wellness visit.  Ria Bush, MD

## 2016-10-08 NOTE — Assessment & Plan Note (Signed)
Chronic, stable. Improved readings today. Continue current regimen.

## 2016-10-08 NOTE — Assessment & Plan Note (Signed)
Remains abstinent - congratulated. Will order lung cancer screening CT in [place].

## 2016-10-08 NOTE — Progress Notes (Signed)
Pre visit review using our clinic review tool, if applicable. No additional management support is needed unless otherwise documented below in the visit note. 

## 2016-10-08 NOTE — Assessment & Plan Note (Signed)
Preventative protocols reviewed and updated unless pt declined. Discussed healthy diet and lifestyle.  

## 2016-10-08 NOTE — Patient Instructions (Addendum)
We will refer you for lung cancer screening CT program in [place].  We will order carotid ultrasound to further evaluate for stenosis.  You are doing well today. Return as needed or in 1 year for next physical/medicare wellness visit  Health Maintenance, Female Adopting a healthy lifestyle and getting preventive care can go a long way to promote health and wellness. Talk with your health care provider about what schedule of regular examinations is right for you. This is a good chance for you to check in with your provider about disease prevention and staying healthy. In between checkups, there are plenty of things you can do on your own. Experts have done a lot of research about which lifestyle changes and preventive measures are most likely to keep you healthy. Ask your health care provider for more information. Weight and diet Eat a healthy diet  Be sure to include plenty of vegetables, fruits, low-fat dairy products, and lean protein.  Do not eat a lot of foods high in solid fats, added sugars, or salt.  Get regular exercise. This is one of the most important things you can do for your health.  Most adults should exercise for at least 150 minutes each week. The exercise should increase your heart rate and make you sweat (moderate-intensity exercise).  Most adults should also do strengthening exercises at least twice a week. This is in addition to the moderate-intensity exercise. Maintain a healthy weight  Body mass index (BMI) is a measurement that can be used to identify possible weight problems. It estimates body fat based on height and weight. Your health care provider can help determine your BMI and help you achieve or maintain a healthy weight.  For females 25 years of age and older:  A BMI below 18.5 is considered underweight.  A BMI of 18.5 to 24.9 is normal.  A BMI of 25 to 29.9 is considered overweight.  A BMI of 30 and above is considered obese. Watch levels of  cholesterol and blood lipids  You should start having your blood tested for lipids and cholesterol at 73 years of age, then have this test every 5 years.  You may need to have your cholesterol levels checked more often if:  Your lipid or cholesterol levels are high.  You are older than 73 years of age.  You are at high risk for heart disease. Cancer screening Lung Cancer  Lung cancer screening is recommended for adults 71-19 years old who are at high risk for lung cancer because of a history of smoking.  A yearly low-dose CT scan of the lungs is recommended for people who:  Currently smoke.  Have quit within the past 15 years.  Have at least a 30-pack-year history of smoking. A pack year is smoking an average of one pack of cigarettes a day for 1 year.  Yearly screening should continue until it has been 15 years since you quit.  Yearly screening should stop if you develop a health problem that would prevent you from having lung cancer treatment. Breast Cancer  Practice breast self-awareness. This means understanding how your breasts normally appear and feel.  It also means doing regular breast self-exams. Let your health care provider know about any changes, no matter how small.  If you are in your 20s or 30s, you should have a clinical breast exam (CBE) by a health care provider every 1-3 years as part of a regular health exam.  If you are 68 or older, have a  CBE every year. Also consider having a breast X-ray (mammogram) every year.  If you have a family history of breast cancer, talk to your health care provider about genetic screening.  If you are at high risk for breast cancer, talk to your health care provider about having an MRI and a mammogram every year.  Breast cancer gene (BRCA) assessment is recommended for women who have family members with BRCA-related cancers. BRCA-related cancers include:  Breast.  Ovarian.  Tubal.  Peritoneal cancers.  Results of  the assessment will determine the need for genetic counseling and BRCA1 and BRCA2 testing. Cervical Cancer  Your health care provider may recommend that you be screened regularly for cancer of the pelvic organs (ovaries, uterus, and vagina). This screening involves a pelvic examination, including checking for microscopic changes to the surface of your cervix (Pap test). You may be encouraged to have this screening done every 3 years, beginning at age 14.  For women ages 71-65, health care providers may recommend pelvic exams and Pap testing every 3 years, or they may recommend the Pap and pelvic exam, combined with testing for human papilloma virus (HPV), every 5 years. Some types of HPV increase your risk of cervical cancer. Testing for HPV may also be done on women of any age with unclear Pap test results.  Other health care providers may not recommend any screening for nonpregnant women who are considered low risk for pelvic cancer and who do not have symptoms. Ask your health care provider if a screening pelvic exam is right for you.  If you have had past treatment for cervical cancer or a condition that could lead to cancer, you need Pap tests and screening for cancer for at least 20 years after your treatment. If Pap tests have been discontinued, your risk factors (such as having a new sexual partner) need to be reassessed to determine if screening should resume. Some women have medical problems that increase the chance of getting cervical cancer. In these cases, your health care provider may recommend more frequent screening and Pap tests. Colorectal Cancer  This type of cancer can be detected and often prevented.  Routine colorectal cancer screening usually begins at 73 years of age and continues through 73 years of age.  Your health care provider may recommend screening at an earlier age if you have risk factors for colon cancer.  Your health care provider may also recommend using home test  kits to check for hidden blood in the stool.  A small camera at the end of a tube can be used to examine your colon directly (sigmoidoscopy or colonoscopy). This is done to check for the earliest forms of colorectal cancer.  Routine screening usually begins at age 40.  Direct examination of the colon should be repeated every 5-10 years through 73 years of age. However, you may need to be screened more often if early forms of precancerous polyps or small growths are found. Skin Cancer  Check your skin from head to toe regularly.  Tell your health care provider about any new moles or changes in moles, especially if there is a change in a mole's shape or color.  Also tell your health care provider if you have a mole that is larger than the size of a pencil eraser.  Always use sunscreen. Apply sunscreen liberally and repeatedly throughout the day.  Protect yourself by wearing long sleeves, pants, a wide-brimmed hat, and sunglasses whenever you are outside. Heart disease, diabetes, and high  blood pressure  High blood pressure causes heart disease and increases the risk of stroke. High blood pressure is more likely to develop in:  People who have blood pressure in the high end of the normal range (130-139/85-89 mm Hg).  People who are overweight or obese.  People who are African American.  If you are 4-27 years of age, have your blood pressure checked every 3-5 years. If you are 25 years of age or older, have your blood pressure checked every year. You should have your blood pressure measured twice-once when you are at a hospital or clinic, and once when you are not at a hospital or clinic. Record the average of the two measurements. To check your blood pressure when you are not at a hospital or clinic, you can use:  An automated blood pressure machine at a pharmacy.  A home blood pressure monitor.  If you are between 59 years and 66 years old, ask your health care provider if you should  take aspirin to prevent strokes.  Have regular diabetes screenings. This involves taking a blood sample to check your fasting blood sugar level.  If you are at a normal weight and have a low risk for diabetes, have this test once every three years after 73 years of age.  If you are overweight and have a high risk for diabetes, consider being tested at a younger age or more often. Preventing infection Hepatitis B  If you have a higher risk for hepatitis B, you should be screened for this virus. You are considered at high risk for hepatitis B if:  You were born in a country where hepatitis B is common. Ask your health care provider which countries are considered high risk.  Your parents were born in a high-risk country, and you have not been immunized against hepatitis B (hepatitis B vaccine).  You have HIV or AIDS.  You use needles to inject street drugs.  You live with someone who has hepatitis B.  You have had sex with someone who has hepatitis B.  You get hemodialysis treatment.  You take certain medicines for conditions, including cancer, organ transplantation, and autoimmune conditions. Hepatitis C  Blood testing is recommended for:  Everyone born from [birth year] through 1967.  Anyone with known risk factors for hepatitis C. Sexually transmitted infections (STIs)  You should be screened for sexually transmitted infections (STIs) including gonorrhea and chlamydia if:  You are sexually active and are younger than 73 years of age.  You are older than 73 years of age and your health care provider tells you that you are at risk for this type of infection.  Your sexual activity has changed since you were last screened and you are at an increased risk for chlamydia or gonorrhea. Ask your health care provider if you are at risk.  If you do not have HIV, but are at risk, it may be recommended that you take a prescription medicine daily to prevent HIV infection. This is called  pre-exposure prophylaxis (PrEP). You are considered at risk if:  You are sexually active and do not regularly use condoms or know the HIV status of your partner(s).  You take drugs by injection.  You are sexually active with a partner who has HIV. Talk with your health care provider about whether you are at high risk of being infected with HIV. If you choose to begin PrEP, you should first be tested for HIV. You should then be tested every 3 months  for as long as you are taking PrEP. Pregnancy  If you are premenopausal and you may become pregnant, ask your health care provider about preconception counseling.  If you may become pregnant, take 400 to 800 micrograms (mcg) of folic acid every day.  If you want to prevent pregnancy, talk to your health care provider about birth control (contraception). Osteoporosis and menopause  Osteoporosis is a disease in which the bones lose minerals and strength with aging. This can result in serious bone fractures. Your risk for osteoporosis can be identified using a bone density scan.  If you are 34 years of age or older, or if you are at risk for osteoporosis and fractures, ask your health care provider if you should be screened.  Ask your health care provider whether you should take a calcium or vitamin D supplement to lower your risk for osteoporosis.  Menopause may have certain physical symptoms and risks.  Hormone replacement therapy may reduce some of these symptoms and risks. Talk to your health care provider about whether hormone replacement therapy is right for you. Follow these instructions at home:  Schedule regular health, dental, and eye exams.  Stay current with your immunizations.  Do not use any tobacco products including cigarettes, chewing tobacco, or electronic cigarettes.  If you are pregnant, do not drink alcohol.  If you are breastfeeding, limit how much and how often you drink alcohol.  Limit alcohol intake to no more  than 1 drink per day for nonpregnant women. One drink equals 12 ounces of beer, 5 ounces of wine, or 1 ounces of hard liquor.  Do not use street drugs.  Do not share needles.  Ask your health care provider for help if you need support or information about quitting drugs.  Tell your health care provider if you often feel depressed.  Tell your health care provider if you have ever been abused or do not feel safe at home. This information is not intended to replace advice given to you by your health care provider. Make sure you discuss any questions you have with your health care provider. Document Released: 12/31/2010 Document Revised: 11/23/2015 Document Reviewed: 03/21/2015 Elsevier Interactive Patient Education  2017 [employer].

## 2016-10-08 NOTE — Assessment & Plan Note (Signed)
Recent flare of knee pain - doing well on hydrocodone PRN

## 2016-10-08 NOTE — Assessment & Plan Note (Signed)
Encouraged healthy diet to affect sustainable weight loss 

## 2016-10-08 NOTE — Assessment & Plan Note (Signed)
Stable period on symbicort and spiriva. Quit smoking 2009

## 2016-10-08 NOTE — Assessment & Plan Note (Signed)
Chronic, mildly elevated. Continue zetia and krill oil. Intolerant to statins.

## 2016-10-09 ENCOUNTER — Telehealth: Payer: Self-pay | Admitting: *Deleted

## 2016-10-09 DIAGNOSIS — Z87891 Personal history of nicotine dependence: Secondary | ICD-10-CM

## 2016-10-09 NOTE — Telephone Encounter (Signed)
Received referral for initial lung cancer screening scan. Contacted patient and obtained smoking history,(former, quit 05/31/08, 30 pack year) as well as answering questions related to screening process. Patient denies signs of lung cancer such as weight loss or hemoptysis. Patient denies comorbidity that would prevent curative treatment if lung cancer were found. Patient is tentatively scheduled for shared decision making visit and CT scan on 10/15/16, pending insurance approval from business office.

## 2016-10-15 ENCOUNTER — Ambulatory Visit
Admission: RE | Admit: 2016-10-15 | Discharge: 2016-10-15 | Disposition: A | Discharge status: Home / Self Care | Payer: Medicare Other | Source: Ambulatory Visit | Attending: Oncology | Admitting: Oncology

## 2016-10-15 ENCOUNTER — Inpatient Hospital Stay: Payer: Medicare Other | Attending: Oncology | Admitting: Oncology

## 2016-10-15 DIAGNOSIS — J432 Centrilobular emphysema: Secondary | ICD-10-CM | POA: Insufficient documentation

## 2016-10-15 DIAGNOSIS — D3501 Benign neoplasm of right adrenal gland: Secondary | ICD-10-CM | POA: Insufficient documentation

## 2016-10-15 DIAGNOSIS — J9811 Atelectasis: Secondary | ICD-10-CM | POA: Insufficient documentation

## 2016-10-15 DIAGNOSIS — I700 Atherosclerosis of aorta: Secondary | ICD-10-CM | POA: Insufficient documentation

## 2016-10-15 DIAGNOSIS — Z122 Encounter for screening for malignant neoplasm of respiratory organs: Secondary | ICD-10-CM | POA: Insufficient documentation

## 2016-10-15 DIAGNOSIS — Z87891 Personal history of nicotine dependence: Secondary | ICD-10-CM

## 2016-10-15 DIAGNOSIS — I2510 Atherosclerotic heart disease of native coronary artery without angina pectoris: Secondary | ICD-10-CM | POA: Insufficient documentation

## 2016-10-18 ENCOUNTER — Encounter: Payer: Self-pay | Admitting: *Deleted

## 2016-10-18 ENCOUNTER — Encounter: Payer: Self-pay | Admitting: Family Medicine

## 2016-10-18 DIAGNOSIS — I2510 Atherosclerotic heart disease of native coronary artery without angina pectoris: Secondary | ICD-10-CM

## 2016-10-18 DIAGNOSIS — D3501 Benign neoplasm of right adrenal gland: Secondary | ICD-10-CM | POA: Insufficient documentation

## 2016-10-18 DIAGNOSIS — J432 Centrilobular emphysema: Secondary | ICD-10-CM

## 2016-10-18 HISTORY — DX: Centrilobular emphysema: J43.2

## 2016-10-18 HISTORY — DX: Benign neoplasm of right adrenal gland: D35.01

## 2016-10-18 HISTORY — DX: Atherosclerotic heart disease of native coronary artery without angina pectoris: I25.10

## 2016-10-20 DIAGNOSIS — Z87891 Personal history of nicotine dependence: Secondary | ICD-10-CM | POA: Insufficient documentation

## 2016-10-20 NOTE — Progress Notes (Signed)
In accordance with CMS guidelines, patient has met eligibility criteria including age, absence of signs or symptoms of lung cancer.  Social History  Substance Use Topics  . Smoking status: Former Smoker    Packs/day: 1.00    Years: 30.00    Types: Cigarettes    Quit date: 05/31/2008  . Smokeless tobacco: Never Used  . Alcohol use 0.0 oz/week     Comment: ocassionally     A shared decision-making session was conducted prior to the performance of CT scan. This includes one or more decision aids, includes benefits and harms of screening, follow-up diagnostic testing, over-diagnosis, false positive rate, and total radiation exposure.  Counseling on the importance of adherence to annual lung cancer LDCT screening, impact of co-morbidities, and ability or willingness to undergo diagnosis and treatment is imperative for compliance of the program.  Counseling on the importance of continued smoking cessation for former smokers; the importance of smoking cessation for current smokers, and information about tobacco cessation interventions have been given to patient including [place] [facility] and 1800 quit [place] programs.  Written order for lung cancer screening with LDCT has been given to the patient and any and all questions have been answered to the best of my abilities.   Yearly follow up will be coordinated by Shawn Perkins, Thoracic Navigator.  

## 2016-10-29 ENCOUNTER — Other Ambulatory Visit: Payer: Self-pay | Admitting: Family Medicine

## 2016-11-12 ENCOUNTER — Ambulatory Visit: Payer: Medicare Other

## 2016-11-12 DIAGNOSIS — R0989 Other specified symptoms and signs involving the circulatory and respiratory systems: Secondary | ICD-10-CM | POA: Diagnosis not present

## 2016-11-13 LAB — VAS US CAROTID
LCCAPDIAS: 24 cm/s
LEFT ECA DIAS: -20 cm/s
LEFT VERTEBRAL DIAS: -13 cm/s
LICAPDIAS: 26 cm/s
Left CCA dist dias: -29 cm/s
Left CCA dist sys: -134 cm/s
Left CCA prox sys: 109 cm/s
Left ICA dist dias: -27 cm/s
Left ICA dist sys: -105 cm/s
Left ICA prox sys: 84 cm/s
RIGHT ECA DIAS: 0 cm/s
RIGHT VERTEBRAL DIAS: -22 cm/s
Right CCA prox dias: 6 cm/s
Right CCA prox sys: 104 cm/s
Right cca dist sys: -113 cm/s

## 2016-11-16 ENCOUNTER — Encounter: Payer: Self-pay | Admitting: Family Medicine

## 2016-11-16 DIAGNOSIS — I6523 Occlusion and stenosis of bilateral carotid arteries: Secondary | ICD-10-CM | POA: Insufficient documentation

## 2017-01-21 DIAGNOSIS — E784 Other hyperlipidemia: Secondary | ICD-10-CM | POA: Diagnosis not present

## 2017-01-21 DIAGNOSIS — I10 Essential (primary) hypertension: Secondary | ICD-10-CM | POA: Diagnosis not present

## 2017-01-21 DIAGNOSIS — I6523 Occlusion and stenosis of bilateral carotid arteries: Secondary | ICD-10-CM | POA: Diagnosis not present

## 2017-01-22 ENCOUNTER — Other Ambulatory Visit: Payer: Self-pay | Admitting: Family Medicine

## 2017-02-26 ENCOUNTER — Other Ambulatory Visit: Payer: Self-pay | Admitting: Family Medicine

## 2017-03-06 ENCOUNTER — Other Ambulatory Visit: Payer: Self-pay | Admitting: Family Medicine

## 2017-06-03 ENCOUNTER — Encounter: Payer: Self-pay | Admitting: General Surgery

## 2017-06-03 DIAGNOSIS — Z1231 Encounter for screening mammogram for malignant neoplasm of breast: Secondary | ICD-10-CM | POA: Diagnosis not present

## 2017-06-09 ENCOUNTER — Ambulatory Visit: Payer: Medicare Other | Admitting: General Surgery

## 2017-06-11 ENCOUNTER — Ambulatory Visit: Payer: Medicare Other | Admitting: General Surgery

## 2017-06-11 ENCOUNTER — Encounter: Payer: Self-pay | Admitting: General Surgery

## 2017-06-11 VITALS — BP 162/70 | HR 88 | Resp 14 | Ht 64.0 in | Wt 215.0 lb

## 2017-06-11 DIAGNOSIS — Z1231 Encounter for screening mammogram for malignant neoplasm of breast: Secondary | ICD-10-CM

## 2017-06-11 DIAGNOSIS — Z1239 Encounter for other screening for malignant neoplasm of breast: Secondary | ICD-10-CM

## 2017-06-11 NOTE — Patient Instructions (Addendum)
The patient has been asked to return to the office in one year with a bilateral screening mammogram at [facility]. Patient to call back and report about a carotid ultrasound.

## 2017-06-11 NOTE — Progress Notes (Signed)
Patient ID: Monica Wise, female   DOB: 1946/01/27, 71 y.o.   MRN: [id number]  Chief Complaint  Patient presents with  . Follow-up    HPI Monica Wise is a 71 y.o. female who presents for a breast evaluation. The most recent mammogram was done on 06/03/2017  Patient does perform regular self breast checks and gets regular mammograms done.    HPI  Past Medical History:  Diagnosis Date  . Adrenal adenoma, right 10/18/2016   Incidental by CT 09/2016  . Aortic valve sclerosis 2014   withoout stenosis, mild aortic insufficiency - Dr Ubaldo Glassing  . Asthma   . CAD (coronary artery disease) 10/18/2016   By CT 09/2016  . Centrilobular emphysema ([place]) 10/18/2016   By CT 09/2016  . Chronic diarrhea 2011   worse in am, ?IBS  . COPD (chronic obstructive pulmonary disease) ([place]) 2005   chronic bronchitis - spirometry FVC 70%, FEV1 72%, abnormal loop volume (unknown date)  . Essential hypertension   . Ex-smoker   . GERD (gastroesophageal reflux disease)   . History of chicken pox   . History of measles   . Hyperlipidemia   . IBS (irritable bowel syndrome)   . Lumbago   . Lump or mass in breast 2012   right breast US guided finesse 1 o'clock  . Obesity   . Osteoarthritis    knees s/p surgeries  . Osteopenia 09/2014   mild by DEXA (-1.2 femur neck)  . Perennial allergic rhinitis     Past Surgical History:  Procedure Laterality Date  . BREAST BIOPSY Right 2012  . BREAST LUMPECTOMY Left 1995   benign mass  . CARDIOVASCULAR STRESS TEST  12/2004   nl myoview, preserved LV fxn with EF 71% without reversible ischemia ([place])  . COLONOSCOPY  2010   hyperplastic polyps identified. rpt 10 yrs (Jenisha Faison)  . DEXA  09/2014   T -1.2 femur neck  . ESOPHAGOGASTRODUODENOSCOPY  2010   Tamica Covell  . KNEE SURGERY Right 2010   arthroscopic  . KNEE SURGERY Left 2014    Family History  Problem Relation Age of Onset  . Diverticulitis Mother   . Heart failure Mother   . Cancer Maternal Aunt        bone, and unsure   . CAD Father 5       MI x3  . Sudden death Brother 7    Social History Social History   Tobacco Use  . Smoking status: Former Smoker    Packs/day: 1.00    Years: 30.00    Pack years: 30.00    Types: Cigarettes    Last attempt to quit: 05/31/2008    Years since quitting: 9.0  . Smokeless tobacco: Never Used  Substance Use Topics  . Alcohol use: Yes    Alcohol/week: 0.0 oz    Comment: ocassionally  . Drug use: No    Allergies  Allergen Reactions  . Altace [Ramipril]     unknown  . Benicar [Olmesartan]     unknown  . Codeine Nausea And Vomiting  . Demerol [Meperidine] Nausea And Vomiting  . Statins Other (See Comments)    Myalgias to crestor, refused further statins  . Sulfa Antibiotics     Unknown reaction per patient    Current Outpatient Medications  Medication Sig Dispense Refill  . albuterol (PROVENTIL HFA;VENTOLIN HFA) 108 (90 BASE) MCG/ACT inhaler Inhale 1-2 puffs into the lungs every 6 (six) hours as needed for wheezing or shortness of breath.    Marland Kitchen  amLODipine (NORVASC) 5 MG tablet TAKE 1 TABLET (5 MG TOTAL) BY MOUTH DAILY. 90 tablet 3  . aspirin 81 MG tablet Take 81 mg by mouth as needed.     Marland Kitchen b complex vitamins tablet Take 1 tablet by mouth as needed.    Marland Kitchen CETIRIZINE HCL PO Take 1 tablet by mouth as needed.    . Cholecalciferol (VITAMIN D) 2000 units CAPS Take 1 capsule by mouth daily.    Marland Kitchen ezetimibe (ZETIA) 10 MG tablet Take 1 tablet (10 mg total) by mouth daily. MUST SCHEDULE ANNUAL WELLNESS EXAM 90 tablet 3  . famotidine (PEPCID) 20 MG tablet Take 20 mg by mouth as needed.     . furosemide (LASIX) 20 MG tablet Take 1 tablet (20 mg total) by mouth daily as needed for fluid. 30 tablet 6  . HYDROcodone-acetaminophen (NORCO/VICODIN) 5-325 MG tablet Take 1 tablet by mouth every 6 (six) hours as needed for moderate pain. 20 tablet 0  . Loperamide HCl (ANTI-DIARRHEAL PO) Take by mouth.    . montelukast (SINGULAIR) 10 MG tablet TAKE 1 TABLET BY MOUTH EVERY DAY  IN THE EVENING 90 tablet 1  . Multiple Vitamin (MULTIVITAMIN) tablet Take 1 tablet by mouth daily.    . Omega-3 Krill Oil 500 MG CAPS Take 500 mg by mouth daily.    Marland Kitchen omeprazole (PRILOSEC) 20 MG capsule Take 20 mg by mouth as needed. Reported on 07/31/2015    . OVER THE COUNTER MEDICATION as needed. Ginsana    . potassium chloride (K-DUR) 10 MEQ tablet Take 1 tablet (10 mEq total) by mouth daily as needed. With lasix 30 tablet 6  . SPIRIVA HANDIHALER 18 MCG inhalation capsule PLACE 1 CAPSULE (18 MCG TOTAL) INTO INHALER AND INHALE DAILY. 30 capsule 10  . SYMBICORT 160-4.5 MCG/ACT inhaler INHALE 2 PUFFS TWICE DAILY 10.2 Inhaler 3  . mometasone (ELOCON) 0.1 % cream Apply 1 application topically daily. (Patient not taking: Reported on 06/11/2017) 45 g 0   No current facility-administered medications for this visit.     Review of Systems Review of Systems  Constitutional: Negative.   Respiratory: Negative.   Cardiovascular: Negative.     Blood pressure (!) 162/70, pulse 88, resp. rate 14, height 5\' 4"  (1.626 m), weight 215 lb (97.5 kg).  Physical Exam Physical Exam  Constitutional: She is oriented to person, place, and time. She appears well-developed and well-nourished.  Eyes: Conjunctivae are normal. No scleral icterus.  Neck: Neck supple.  Cardiovascular: Normal rate and regular rhythm.  Murmur heard.  Systolic murmur is present with a grade of 2/6. Pulses:      Carotid pulses are on the right side with bruit, and on the left side with bruit. Pulmonary/Chest: Effort normal and breath sounds normal. Right breast exhibits no inverted nipple, no mass, no nipple discharge, no skin change and no tenderness. Left breast exhibits no inverted nipple, no mass, no nipple discharge, no skin change and no tenderness.  Musculoskeletal:       Back:  Lymphadenopathy:    She has no cervical adenopathy.    She has no axillary adenopathy.  Neurological: She is alert and oriented to person, place,  and time.  Skin: Skin is warm and dry.  Right side 70mm patch of skin at  L1  Right carotid     Data Reviewed Carotid ultrasound at 11/12/2016 was reviewed. Normal internal, carotid vascular flow noted. Stenosis less than 39%. Screening mammograms dated 06/03/2017 completed at [facility] reviewed, BIRAD-2.  Assessment  Carotid bruit without evidence of associated stenosis.  Stable mammogram and clinical breast exam.    Plan    The patient desires to continue annual breast exams to this office.      The patient has been asked to return to the office in one year with a bilateral screening mammogram at [facility]. Patient to call back and report about a carotid ultrasound.  The patient is aware to call back for any questions or concerns.   HPI, Physical Exam, Assessment and Plan have been scribed under the direction and in the presence of Hervey Ard, MD.  Gaspar Cola, CMA  I have completed the exam and reviewed the above documentation for accuracy and completeness.  I agree with the above.  Haematologist has been used and any errors in dictation or transcription are unintentional.  Hervey Ard, M.D., F.A.C.S.  Robert Bellow 06/12/2017, 8:54 PM

## 2017-06-12 ENCOUNTER — Telehealth: Payer: Self-pay | Admitting: *Deleted

## 2017-06-12 NOTE — Telephone Encounter (Signed)
Patient called stating that you were looking for some reports on a CT scan from 10/15/16 and Carotid U/S that was done on 11/12/16. I printed those out and put them on your desk for review.

## 2017-06-16 NOTE — Telephone Encounter (Signed)
Carotid ultrasound reviewed. No significant stenosis.

## 2017-06-17 NOTE — Telephone Encounter (Signed)
Left message for patient to call the office back regarding her Carotid ultrasound

## 2017-06-17 NOTE — Telephone Encounter (Signed)
Patient called back I Notified patient as instructed, patient agrees.

## 2017-08-22 ENCOUNTER — Other Ambulatory Visit: Payer: Self-pay | Admitting: Family Medicine

## 2017-10-07 ENCOUNTER — Ambulatory Visit: Payer: Medicare Other

## 2017-10-09 ENCOUNTER — Ambulatory Visit: Payer: Medicare Other

## 2017-10-09 ENCOUNTER — Other Ambulatory Visit: Payer: Self-pay | Admitting: Family Medicine

## 2017-10-09 ENCOUNTER — Ambulatory Visit (INDEPENDENT_AMBULATORY_CARE_PROVIDER_SITE_OTHER): Payer: Medicare Other

## 2017-10-09 VITALS — BP 132/80 | HR 76 | Temp 98.1°F | Ht 64.0 in | Wt 217.2 lb

## 2017-10-09 DIAGNOSIS — Z0000 Encounter for general adult medical examination without abnormal findings: Secondary | ICD-10-CM | POA: Diagnosis not present

## 2017-10-09 DIAGNOSIS — E785 Hyperlipidemia, unspecified: Secondary | ICD-10-CM

## 2017-10-09 LAB — COMPREHENSIVE METABOLIC PANEL
ALBUMIN: 4.1 g/dL (ref 3.5–5.2)
ALT: 15 U/L (ref 0–35)
AST: 15 U/L (ref 0–37)
Alkaline Phosphatase: 67 U/L (ref 39–117)
BILIRUBIN TOTAL: 0.6 mg/dL (ref 0.2–1.2)
BUN: 16 mg/dL (ref 6–23)
CHLORIDE: 105 meq/L (ref 96–112)
CO2: 29 meq/L (ref 19–32)
CREATININE: 1.05 mg/dL (ref 0.40–1.20)
Calcium: 9.4 mg/dL (ref 8.4–10.5)
GFR: 54.51 mL/min — AB (ref >60.00)
Glucose, Bld: 108 mg/dL — ABNORMAL HIGH (ref 70–99)
Potassium: 4 mEq/L (ref 3.5–5.1)
Sodium: 143 mEq/L (ref 135–145)
Total Protein: 7.3 g/dL (ref 6.0–8.3)

## 2017-10-09 LAB — LIPID PANEL
CHOLESTEROL: 186 mg/dL (ref 0–200)
HDL: 57.7 mg/dL (ref >39.00)
LDL Cholesterol: 115 mg/dL — ABNORMAL HIGH (ref 0–99)
NonHDL: 128.5
Total CHOL/HDL Ratio: 3
Triglycerides: 69 mg/dL (ref 0.0–149.0)
VLDL: 13.8 mg/dL (ref 0.0–40.0)

## 2017-10-09 LAB — TSH: TSH: 1.41 u[IU]/mL (ref 0.35–4.50)

## 2017-10-09 NOTE — Progress Notes (Signed)
PCP notes:   Health maintenance:  Tetanus - postponed/insurance  Abnormal screenings:   Fall risk - hx of single fall Fall Risk  10/09/2017 10/03/2016 09/26/2015 09/19/2014 09/19/2014  Falls in the past year? Yes Yes No Yes No  Comment fell off porch into flower bed; bruising only with no medical treatment pt fell on 06/27/2016 while at friend's house - slipped on rock outside -  Number falls in past yr: 1 1 - 1 -  Injury with Fall? Yes Yes - No -   Patient concerns:   None  Nurse concerns:  None  Next PCP appt:   10/10/17 @ 1500

## 2017-10-09 NOTE — Patient Instructions (Addendum)
Monica Wise , Thank you for taking time to come for your Medicare Wellness Visit. I appreciate your ongoing commitment to your health goals. Please review the following plan we discussed and let me know if I can assist you in the future.   These are the goals we discussed: Goals    . DIET - INCREASE WATER INTAKE     Starting 10/09/2017, I will continue to drink 5-6 glasses of water daily.        This is a list of the screening recommended for you and due dates:  Health Maintenance  Topic Date Due  . DTaP/Tdap/Td vaccine (1 - Tdap) 10/10/2018*  . Tetanus Vaccine  10/10/2018*  . Flu Shot  01/29/2018  . Mammogram  06/03/2018  . Colon Cancer Screening  07/01/2018  . DEXA scan (bone density measurement)  Completed  .  Hepatitis C: One time screening is recommended by Center for Disease Control  (CDC) for  adults born from [birth year] through 1967.   Completed  . Pneumonia vaccines  Completed  *Topic was postponed. The date shown is not the original due date.   Preventive Care for Adults  A healthy lifestyle and preventive care can promote health and wellness. Preventive health guidelines for adults include the following key practices.  . A routine yearly physical is a good way to check with your health care provider about your health and preventive screening. It is a chance to share any concerns and updates on your health and to receive a thorough exam.  . Visit your dentist for a routine exam and preventive care every 6 months. Brush your teeth twice a day and floss once a day. Good oral hygiene prevents tooth decay and gum disease.  . The frequency of eye exams is based on your age, health, family medical history, use  of contact lenses, and other factors. Follow your health care provider's recommendations for frequency of eye exams.  . Eat a healthy diet. Foods like vegetables, fruits, whole grains, low-fat dairy products, and lean protein foods contain the nutrients you need without too many  calories. Decrease your intake of foods high in solid fats, added sugars, and salt. Eat the right amount of calories for you. Get information about a proper diet from your health care provider, if necessary.  . Regular physical exercise is one of the most important things you can do for your health. Most adults should get at least 150 minutes of moderate-intensity exercise (any activity that increases your heart rate and causes you to sweat) each week. In addition, most adults need muscle-strengthening exercises on 2 or more days a week.  Silver Sneakers may be a benefit available to you. To determine eligibility, you may visit the website: www.silversneakers.com or contact program at 424-486-0228 Mon-Fri between 8AM-8PM.   . Maintain a healthy weight. The body mass index (BMI) is a screening tool to identify possible weight problems. It provides an estimate of body fat based on height and weight. Your health care provider can find your BMI and can help you achieve or maintain a healthy weight.   For adults 20 years and older: ? A BMI below 18.5 is considered underweight. ? A BMI of 18.5 to 24.9 is normal. ? A BMI of 25 to 29.9 is considered overweight. ? A BMI of 30 and above is considered obese.   . Maintain normal blood lipids and cholesterol levels by exercising and minimizing your intake of saturated fat. Eat a balanced diet with plenty  of fruit and vegetables. Blood tests for lipids and cholesterol should begin at age 90 and be repeated every 5 years. If your lipid or cholesterol levels are high, you are over 50, or you are at high risk for heart disease, you may need your cholesterol levels checked more frequently. Ongoing high lipid and cholesterol levels should be treated with medicines if diet and exercise are not working.  . If you smoke, find out from your health care provider how to quit. If you do not use tobacco, please do not start.  . If you choose to drink alcohol, please do  not consume more than 2 drinks per day. One drink is considered to be 12 ounces (355 mL) of beer, 5 ounces (148 mL) of wine, or 1.5 ounces (44 mL) of liquor.  . If you are 9-35 years old, ask your health care provider if you should take aspirin to prevent strokes.  . Use sunscreen. Apply sunscreen liberally and repeatedly throughout the day. You should seek shade when your shadow is shorter than you. Protect yourself by wearing long sleeves, pants, a wide-brimmed hat, and sunglasses year round, whenever you are outdoors.  . Once a month, do a whole body skin exam, using a mirror to look at the skin on your back. Tell your health care provider of new moles, moles that have irregular borders, moles that are larger than a pencil eraser, or moles that have changed in shape or color.

## 2017-10-09 NOTE — Progress Notes (Signed)
Subjective:   Monica Wise is a 72 y.o. female who presents for Medicare Annual (Subsequent) preventive examination.  Review of Systems:  N/A Cardiac Risk Factors include: advanced age (>40men, >1 women);obesity (BMI >30kg/m2);hypertension     Objective:     Vitals: BP 132/80 (BP Location: Right Arm, Patient Position: Sitting, Cuff Size: Normal)   Pulse 76   Temp 98.1 F (36.7 C) (Oral)   Ht 5\' 4"  (1.626 m) Comment: no shoes  Wt 217 lb 4 oz (98.5 kg)   SpO2 95%   BMI 37.29 kg/m   Body mass index is 37.29 kg/m.  Advanced Directives 10/09/2017 10/03/2016  Does Patient Have a Medical Advance Directive? Yes Yes  Type of Advance Directive Healthcare Power of [place] in Chart? No - copy requested No - copy requested    Tobacco Social History   Tobacco Use  Smoking Status Former Smoker  . Packs/day: 1.00  . Years: 30.00  . Pack years: 30.00  . Types: Cigarettes  . Last attempt to quit: 05/31/2008  . Years since quitting: 9.3  Smokeless Tobacco Never Used     Counseling given: No   Clinical Intake:  Pre-visit preparation completed: Yes  Pain Score: 5 (bilateral legs)     Nutritional Status: BMI > 30  Obese Nutritional Risks: None Diabetes: No  How often do you need to have someone help you when you read instructions, pamphlets, or other written materials from your doctor or pharmacy?: 1 - Never What is the last grade level you completed in school?: 12th grade + business college  Interpreter Needed?: No  Comments: pt is divorced and lives alone Information entered by :: [employer], LPN  Past Medical History:  Diagnosis Date  . Adrenal adenoma, right 10/18/2016   Incidental by CT 09/2016  . Aortic valve sclerosis 2014   withoout stenosis, mild aortic insufficiency - Dr Ubaldo Glassing  . Asthma   . CAD (coronary artery disease) 10/18/2016   By CT 09/2016  . Centrilobular emphysema ([place]) 10/18/2016   By CT  09/2016  . Chronic diarrhea 2011   worse in am, ?IBS  . COPD (chronic obstructive pulmonary disease) ([place]) 2005   chronic bronchitis - spirometry FVC 70%, FEV1 72%, abnormal loop volume (unknown date)  . Essential hypertension   . Ex-smoker   . GERD (gastroesophageal reflux disease)   . History of chicken pox   . History of measles   . Hyperlipidemia   . IBS (irritable bowel syndrome)   . Lumbago   . Lump or mass in breast 2012   right breast US guided finesse 1 o'clock  . Obesity   . Osteoarthritis    knees s/p surgeries  . Osteopenia 09/2014   mild by DEXA (-1.2 femur neck)  . Perennial allergic rhinitis    Past Surgical History:  Procedure Laterality Date  . BREAST BIOPSY Right 2012  . BREAST LUMPECTOMY Left 1995   benign mass  . CARDIOVASCULAR STRESS TEST  12/2004   nl myoview, preserved LV fxn with EF 71% without reversible ischemia ([place])  . COLONOSCOPY  2010   hyperplastic polyps identified. rpt 10 yrs ([facility])  . DEXA  09/2014   T -1.2 femur neck  . ESOPHAGOGASTRODUODENOSCOPY  2010   [facility]  . KNEE SURGERY Right 2010   arthroscopic  . KNEE SURGERY Left 2014   Family History  Problem Relation Age of Onset  . Diverticulitis Mother   .  Heart failure Mother   . Cancer Maternal Aunt        bone, and unsure  . CAD Father 22       MI x3  . Sudden death Brother 89   Social History   Socioeconomic History  . Marital status: Divorced    Spouse name: Not on file  . Number of children: Not on file  . Years of education: Not on file  . Highest education level: Not on file  Occupational History  . Not on file  Social Needs  . Financial resource strain: Not on file  . Food insecurity:    Worry: Not on file    Inability: Not on file  . Transportation needs:    Medical: Not on file    Non-medical: Not on file  Tobacco Use  . Smoking status: Former Smoker    Packs/day: 1.00    Years: 30.00    Pack years: 30.00    Types: Cigarettes    Last attempt to quit:  05/31/2008    Years since quitting: 9.3  . Smokeless tobacco: Never Used  Substance and Sexual Activity  . Alcohol use: Yes    Alcohol/week: 0.0 oz    Comment: ocassionally wine  . Drug use: No  . Sexual activity: Not Currently  Lifestyle  . Physical activity:    Days per week: Not on file    Minutes per session: Not on file  . Stress: Not on file  Relationships  . Social connections:    Talks on phone: Not on file    Gets together: Not on file    Attends religious service: Not on file    Active member of club or organization: Not on file    Attends meetings of clubs or organizations: Not on file    Relationship status: Not on file  Other Topics Concern  . Not on file  Social History Narrative   Lives alone, 2 feral cats   Occupation: retired, worked for [employer]   Edu: [employer]   Activity: works in yard, unable to walk 2/2 knee pain   Diet: good water, fruits/vegetables daily    Outpatient Encounter Medications as of 10/09/2017  Medication Sig  . albuterol (PROVENTIL HFA;VENTOLIN HFA) 108 (90 BASE) MCG/ACT inhaler Inhale 1-2 puffs into the lungs every 6 (six) hours as needed for wheezing or shortness of breath.  Marland Kitchen amLODipine (NORVASC) 5 MG tablet TAKE 1 TABLET (5 MG TOTAL) BY MOUTH DAILY.  Marland Kitchen aspirin 81 MG tablet Take 81 mg by mouth as needed.   Marland Kitchen b complex vitamins tablet Take 1 tablet by mouth as needed.  Marland Kitchen CETIRIZINE HCL PO Take 1 tablet by mouth as needed.  . Cholecalciferol (VITAMIN D) 2000 units CAPS Take 1 capsule by mouth daily.  Marland Kitchen ezetimibe (ZETIA) 10 MG tablet Take 1 tablet (10 mg total) by mouth daily. MUST SCHEDULE ANNUAL WELLNESS EXAM  . famotidine (PEPCID) 20 MG tablet Take 20 mg by mouth as needed.   . furosemide (LASIX) 20 MG tablet Take 1 tablet (20 mg total) by mouth daily as needed for fluid.  . Loperamide HCl (ANTI-DIARRHEAL PO) Take by mouth.  . mometasone (ELOCON) 0.1 % cream Apply 1 application topically daily. (Patient taking differently: Apply 1  application topically as needed. )  . montelukast (SINGULAIR) 10 MG tablet TAKE 1 TABLET BY MOUTH EVERY DAY IN THE EVENING  . Multiple Vitamin (MULTIVITAMIN) tablet Take 1 tablet by mouth daily.  . Omega-3 Astrid Drafts  500 MG CAPS Take 500 mg by mouth daily.  Marland Kitchen OVER THE COUNTER MEDICATION as needed. Ginsana  . potassium chloride (K-DUR) 10 MEQ tablet Take 1 tablet (10 mEq total) by mouth daily as needed. With lasix  . SPIRIVA HANDIHALER 18 MCG inhalation capsule PLACE 1 CAPSULE (18 MCG TOTAL) INTO INHALER AND INHALE DAILY.  . SYMBICORT 160-4.5 MCG/ACT inhaler TAKE 2 PUFFS BY MOUTH TWICE A DAY  . TURMERIC PO Take 1,000 mg by mouth daily.  . [DISCONTINUED] HYDROcodone-acetaminophen (NORCO/VICODIN) 5-325 MG tablet Take 1 tablet by mouth every 6 (six) hours as needed for moderate pain.  . [DISCONTINUED] omeprazole (PRILOSEC) 20 MG capsule Take 20 mg by mouth as needed. Reported on 07/31/2015   No facility-administered encounter medications on file as of 10/09/2017.     Activities of Daily Living In your present state of health, do you have any difficulty performing the following activities: 10/09/2017  Hearing? N  Vision? N  Difficulty concentrating or making decisions? N  Walking or climbing stairs? N  Dressing or bathing? N  Doing errands, shopping? N  Preparing Food and eating ? N  Using the Toilet? N  In the past six months, have you accidently leaked urine? N  Do you have problems with loss of bowel control? N  Managing your Medications? N  Managing your Finances? N  Housekeeping or managing your Housekeeping? N  Some recent data might be hidden    Patient Care Team: Ria Bush, MD as PCP - General (Family Medicine) Bary Castilla, Forest Gleason, MD (General Surgery) Lorelee Market, MD (Family Medicine)    Assessment:   This is a routine wellness examination for Zury.   Hearing Screening   125Hz  250Hz  500Hz  1000Hz  2000Hz  3000Hz  4000Hz  6000Hz  8000Hz   Right ear:   40 40 40  40      Left ear:   40 40 40  40      Visual Acuity Screening   Right eye Left eye Both eyes  Without correction: 20/25 20/25 20/25   With correction:        Exercise Activities and Dietary recommendations Current Exercise Habits: The patient does not participate in regular exercise at present, Exercise limited by: None identified  Goals    . DIET - INCREASE WATER INTAKE     Starting 10/09/2017, I will continue to drink 5-6 glasses of water daily.        Fall Risk Fall Risk  10/09/2017 10/03/2016 09/26/2015 09/19/2014 09/19/2014  Falls in the past year? Yes Yes No Yes No  Comment fell off porch into flower bed; bruising only with no medical treatment pt fell on 06/27/2016 while at friend's house - slipped on rock outside -  Number falls in past yr: 1 1 - 1 -  Injury with Fall? Yes Yes - No -   Depression Screen PHQ 2/9 Scores 10/09/2017 10/03/2016 09/26/2015 09/19/2014  PHQ - 2 Score 2 1 0 0  PHQ- 9 Score 7 - - -     Cognitive Function MMSE - Mini Mental State Exam 10/09/2017 10/03/2016  Orientation to time 5 5  Orientation to Place 5 5  Registration 3 3  Attention/ Calculation 0 0  Recall 3 3  Language- name 2 objects 0 0  Language- repeat 1 1  Language- follow 3 step command 3 3  Language- read & follow direction 0 0  Write a sentence 0 0  Copy design 0 0  Total score 20 20     PLEASE NOTE: A  Mini-Cog screen was completed. Maximum score is 20. A value of 0 denotes this part of [place] MMSE was not completed or the patient failed this part of the Mini-Cog screening.   Mini-Cog Screening Orientation to Time - Max 5 pts Orientation to Place - Max 5 pts Registration - Max 3 pts Recall - Max 3 pts Language Repeat - Max 1 pts Language Follow 3 Step Command - Max 3 pts     Immunization History  Administered Date(s) Administered  . Influenza, High Dose Seasonal PF 06/12/2017  . Influenza-Unspecified 03/31/2014, 03/02/2015, 05/22/2016  . Pneumococcal Conjugate-13 10/03/2015  .  Pneumococcal Polysaccharide-23 10/03/2016  . Pneumococcal-Unspecified 07/02/1999  . Zoster 07/04/2014    Screening Tests Health Maintenance  Topic Date Due  . DTaP/Tdap/Td (1 - Tdap) 10/10/2018 (Originally 03/10/1965)  . TETANUS/TDAP  10/10/2018 (Originally 03/10/1965)  . INFLUENZA VACCINE  01/29/2018  . MAMMOGRAM  06/03/2018  . COLONOSCOPY  07/01/2018  . DEXA SCAN  Completed  . Hepatitis C Screening  Completed  . PNA vac Low Risk Adult  Completed       Plan:     I have personally reviewed, addressed, and noted the following in the patient's chart:  A. Medical and social history B. Use of alcohol, tobacco or illicit drugs  C. Current medications and supplements D. Functional ability and status E.  Nutritional status F.  Physical activity G. Advance directives H. List of other physicians I.  Hospitalizations, surgeries, and ER visits in previous 12 months J.  [place] to include hearing, vision, cognitive, depression L. Referrals and appointments - none  In addition, I have reviewed and discussed with patient certain preventive protocols, quality metrics, and best practice recommendations. A written personalized care plan for preventive services as well as general preventive health recommendations were provided to patient.  See attached scanned questionnaire for additional information.   Signed,   Lindell Noe, MHA, BS, LPN Health Coach

## 2017-10-09 NOTE — Progress Notes (Signed)
I reviewed health advisor's note, was available for consultation, and agree with documentation and plan.  

## 2017-10-10 ENCOUNTER — Ambulatory Visit (INDEPENDENT_AMBULATORY_CARE_PROVIDER_SITE_OTHER): Payer: Medicare Other | Admitting: Family Medicine

## 2017-10-10 ENCOUNTER — Telehealth: Payer: Self-pay | Admitting: *Deleted

## 2017-10-10 ENCOUNTER — Encounter: Payer: Self-pay | Admitting: Family Medicine

## 2017-10-10 VITALS — BP 124/78 | HR 73 | Temp 98.4°F | Ht 64.0 in | Wt 218.0 lb

## 2017-10-10 DIAGNOSIS — I10 Essential (primary) hypertension: Secondary | ICD-10-CM | POA: Diagnosis not present

## 2017-10-10 DIAGNOSIS — Z0000 Encounter for general adult medical examination without abnormal findings: Secondary | ICD-10-CM | POA: Diagnosis not present

## 2017-10-10 DIAGNOSIS — Z87891 Personal history of nicotine dependence: Secondary | ICD-10-CM | POA: Diagnosis not present

## 2017-10-10 DIAGNOSIS — J432 Centrilobular emphysema: Secondary | ICD-10-CM

## 2017-10-10 DIAGNOSIS — I350 Nonrheumatic aortic (valve) stenosis: Secondary | ICD-10-CM | POA: Insufficient documentation

## 2017-10-10 DIAGNOSIS — Z7189 Other specified counseling: Secondary | ICD-10-CM

## 2017-10-10 DIAGNOSIS — I358 Other nonrheumatic aortic valve disorders: Secondary | ICD-10-CM

## 2017-10-10 DIAGNOSIS — E785 Hyperlipidemia, unspecified: Secondary | ICD-10-CM | POA: Diagnosis not present

## 2017-10-10 DIAGNOSIS — I2510 Atherosclerotic heart disease of native coronary artery without angina pectoris: Secondary | ICD-10-CM

## 2017-10-10 DIAGNOSIS — Z122 Encounter for screening for malignant neoplasm of respiratory organs: Secondary | ICD-10-CM

## 2017-10-10 MED ORDER — ALBUTEROL SULFATE HFA 108 (90 BASE) MCG/ACT IN AERS: 1.0000 | INHALATION_SPRAY | Freq: Four times a day (QID) | RESPIRATORY_TRACT | 3 refills | Status: DC | PRN

## 2017-10-10 NOTE — Assessment & Plan Note (Signed)
Continue symbicort, spiriva. Albuterol refilled.

## 2017-10-10 NOTE — Assessment & Plan Note (Signed)
Advanced directive: Again reviewed with patient - son is POA. Has also discussed with local cousin. Declines bringing copy today.

## 2017-10-10 NOTE — Assessment & Plan Note (Signed)
Chronic, stable. Continue current regimen. 

## 2017-10-10 NOTE — Assessment & Plan Note (Signed)
Chronic, stable. Continue current regimen of zetia and fish oil. Congratulated on improved readings. Pt very happy The 10-year ASCVD risk score Mikey Bussing DC Jr., et al., 2013) is: 16%   Values used to calculate the score:     Age: 72 years     Sex: Female     Is Non-Hispanic African American: No     Diabetic: No     Tobacco smoker: No     Systolic Blood Pressure: 903 mmHg     Is BP treated: Yes     HDL Cholesterol: 57.7 mg/dL     Total Cholesterol: 186 mg/dL

## 2017-10-10 NOTE — Assessment & Plan Note (Signed)
Latest echo 2014 showed aortic valve sclerosis not stenosis and mild aortic insufficiency.

## 2017-10-10 NOTE — Telephone Encounter (Signed)
Notified patient that annual lung cancer screening low dose CT scan is due currently or will be in near future. Confirmed that patient is within the age range of 55-77, and asymptomatic, (no signs or symptoms of lung cancer). Patient denies illness that would prevent curative treatment for lung cancer if found. Verified smoking history, (former, quit 05/31/08, 30 pack year). The shared decision making visit was done 10/15/16. Patient is agreeable for CT scan being scheduled.

## 2017-10-10 NOTE — Assessment & Plan Note (Signed)
Preventative protocols reviewed and updated unless pt declined. Discussed healthy diet and lifestyle.  

## 2017-10-10 NOTE — Assessment & Plan Note (Signed)
Continue to encourage healthy diet and lifestyle changes for sustainable weight loss.  

## 2017-10-10 NOTE — Patient Instructions (Addendum)
Good to see you today, call us with questions.  I think you are doing well. Return as needed or in 1 year for next physical Health Maintenance, Female Adopting a healthy lifestyle and getting preventive care can go a long way to promote health and wellness. Talk with your health care provider about what schedule of regular examinations is right for you. This is a good chance for you to check in with your provider about disease prevention and staying healthy. In between checkups, there are plenty of things you can do on your own. Experts have done a lot of research about which lifestyle changes and preventive measures are most likely to keep you healthy. Ask your health care provider for more information. Weight and diet Eat a healthy diet  Be sure to include plenty of vegetables, fruits, low-fat dairy products, and lean protein.  Do not eat a lot of foods high in solid fats, added sugars, or salt.  Get regular exercise. This is one of the most important things you can do for your health. ? Most adults should exercise for at least 150 minutes each week. The exercise should increase your heart rate and make you sweat (moderate-intensity exercise). ? Most adults should also do strengthening exercises at least twice a week. This is in addition to the moderate-intensity exercise.  Maintain a healthy weight  Body mass index (BMI) is a measurement that can be used to identify possible weight problems. It estimates body fat based on height and weight. Your health care provider can help determine your BMI and help you achieve or maintain a healthy weight.  For females 37 years of age and older: ? A BMI below 18.5 is considered underweight. ? A BMI of 18.5 to 24.9 is normal. ? A BMI of 25 to 29.9 is considered overweight. ? A BMI of 30 and above is considered obese.  Watch levels of cholesterol and blood lipids  You should start having your blood tested for lipids and cholesterol at 74 years of  age, then have this test every 5 years.  You may need to have your cholesterol levels checked more often if: ? Your lipid or cholesterol levels are high. ? You are older than 74 years of age. ? You are at high risk for heart disease.  Cancer screening Lung Cancer  Lung cancer screening is recommended for adults 37-47 years old who are at high risk for lung cancer because of a history of smoking.  A yearly low-dose CT scan of the lungs is recommended for people who: ? Currently smoke. ? Have quit within the past 15 years. ? Have at least a 30-pack-year history of smoking. A pack year is smoking an average of one pack of cigarettes a day for 1 year.  Yearly screening should continue until it has been 15 years since you quit.  Yearly screening should stop if you develop a health problem that would prevent you from having lung cancer treatment.  Breast Cancer  Practice breast self-awareness. This means understanding how your breasts normally appear and feel.  It also means doing regular breast self-exams. Let your health care provider know about any changes, no matter how small.  If you are in your 20s or 30s, you should have a clinical breast exam (CBE) by a health care provider every 1-3 years as part of a regular health exam.  If you are 12 or older, have a CBE every year. Also consider having a breast X-ray (mammogram) every year.  If you have a family history of breast cancer, talk to your health care provider about genetic screening.  If you are at high risk for breast cancer, talk to your health care provider about having an MRI and a mammogram every year.  Breast cancer gene (BRCA) assessment is recommended for women who have family members with BRCA-related cancers. BRCA-related cancers include: ? Breast. ? Ovarian. ? Tubal. ? Peritoneal cancers.  Results of the assessment will determine the need for genetic counseling and BRCA1 and BRCA2 testing.  Cervical  Cancer Your health care provider may recommend that you be screened regularly for cancer of the pelvic organs (ovaries, uterus, and vagina). This screening involves a pelvic examination, including checking for microscopic changes to the surface of your cervix (Pap test). You may be encouraged to have this screening done every 3 years, beginning at age 28.  For women ages 42-65, health care providers may recommend pelvic exams and Pap testing every 3 years, or they may recommend the Pap and pelvic exam, combined with testing for human papilloma virus (HPV), every 5 years. Some types of HPV increase your risk of cervical cancer. Testing for HPV may also be done on women of any age with unclear Pap test results.  Other health care providers may not recommend any screening for nonpregnant women who are considered low risk for pelvic cancer and who do not have symptoms. Ask your health care provider if a screening pelvic exam is right for you.  If you have had past treatment for cervical cancer or a condition that could lead to cancer, you need Pap tests and screening for cancer for at least 20 years after your treatment. If Pap tests have been discontinued, your risk factors (such as having a new sexual partner) need to be reassessed to determine if screening should resume. Some women have medical problems that increase the chance of getting cervical cancer. In these cases, your health care provider may recommend more frequent screening and Pap tests.  Colorectal Cancer  This type of cancer can be detected and often prevented.  Routine colorectal cancer screening usually begins at 74 years of age and continues through 74 years of age.  Your health care provider may recommend screening at an earlier age if you have risk factors for colon cancer.  Your health care provider may also recommend using home test kits to check for hidden blood in the stool.  A small camera at the end of a tube can be used to  examine your colon directly (sigmoidoscopy or colonoscopy). This is done to check for the earliest forms of colorectal cancer.  Routine screening usually begins at age 21.  Direct examination of the colon should be repeated every 5-10 years through 74 years of age. However, you may need to be screened more often if early forms of precancerous polyps or small growths are found.  Skin Cancer  Check your skin from head to toe regularly.  Tell your health care provider about any new moles or changes in moles, especially if there is a change in a mole's shape or color.  Also tell your health care provider if you have a mole that is larger than the size of a pencil eraser.  Always use sunscreen. Apply sunscreen liberally and repeatedly throughout the day.  Protect yourself by wearing long sleeves, pants, a wide-brimmed hat, and sunglasses whenever you are outside.  Heart disease, diabetes, and high blood pressure  High blood pressure causes heart disease and  increases the risk of stroke. High blood pressure is more likely to develop in: ? People who have blood pressure in the high end of the normal range (130-139/85-89 mm Hg). ? People who are overweight or obese. ? People who are African American.  If you are 73-54 years of age, have your blood pressure checked every 3-5 years. If you are 23 years of age or older, have your blood pressure checked every year. You should have your blood pressure measured twice-once when you are at a hospital or clinic, and once when you are not at a hospital or clinic. Record the average of the two measurements. To check your blood pressure when you are not at a hospital or clinic, you can use: ? An automated blood pressure machine at a pharmacy. ? A home blood pressure monitor.  If you are between 67 years and 20 years old, ask your health care provider if you should take aspirin to prevent strokes.  Have regular diabetes screenings. This involves taking a  blood sample to check your fasting blood sugar level. ? If you are at a normal weight and have a low risk for diabetes, have this test once every three years after 74 years of age. ? If you are overweight and have a high risk for diabetes, consider being tested at a younger age or more often. Preventing infection Hepatitis B  If you have a higher risk for hepatitis B, you should be screened for this virus. You are considered at high risk for hepatitis B if: ? You were born in a country where hepatitis B is common. Ask your health care provider which countries are considered high risk. ? Your parents were born in a high-risk country, and you have not been immunized against hepatitis B (hepatitis B vaccine). ? You have HIV or AIDS. ? You use needles to inject street drugs. ? You live with someone who has hepatitis B. ? You have had sex with someone who has hepatitis B. ? You get hemodialysis treatment. ? You take certain medicines for conditions, including cancer, organ transplantation, and autoimmune conditions.  Hepatitis C  Blood testing is recommended for: ? Everyone born from [birth year] through 1967. ? Anyone with known risk factors for hepatitis C.  Sexually transmitted infections (STIs)  You should be screened for sexually transmitted infections (STIs) including gonorrhea and chlamydia if: ? You are sexually active and are younger than 75 years of age. ? You are older than 74 years of age and your health care provider tells you that you are at risk for this type of infection. ? Your sexual activity has changed since you were last screened and you are at an increased risk for chlamydia or gonorrhea. Ask your health care provider if you are at risk.  If you do not have HIV, but are at risk, it may be recommended that you take a prescription medicine daily to prevent HIV infection. This is called pre-exposure prophylaxis (PrEP). You are considered at risk if: ? You are sexually active and  do not regularly use condoms or know the HIV status of your partner(s). ? You take drugs by injection. ? You are sexually active with a partner who has HIV.  Talk with your health care provider about whether you are at high risk of being infected with HIV. If you choose to begin PrEP, you should first be tested for HIV. You should then be tested every 3 months for as long as you are taking  PrEP. Pregnancy  If you are premenopausal and you may become pregnant, ask your health care provider about preconception counseling.  If you may become pregnant, take 400 to 800 micrograms (mcg) of folic acid every day.  If you want to prevent pregnancy, talk to your health care provider about birth control (contraception). Osteoporosis and menopause  Osteoporosis is a disease in which the bones lose minerals and strength with aging. This can result in serious bone fractures. Your risk for osteoporosis can be identified using a bone density scan.  If you are 45 years of age or older, or if you are at risk for osteoporosis and fractures, ask your health care provider if you should be screened.  Ask your health care provider whether you should take a calcium or vitamin D supplement to lower your risk for osteoporosis.  Menopause may have certain physical symptoms and risks.  Hormone replacement therapy may reduce some of these symptoms and risks. Talk to your health care provider about whether hormone replacement therapy is right for you. Follow these instructions at home:  Schedule regular health, dental, and eye exams.  Stay current with your immunizations.  Do not use any tobacco products including cigarettes, chewing tobacco, or electronic cigarettes.  If you are pregnant, do not drink alcohol.  If you are breastfeeding, limit how much and how often you drink alcohol.  Limit alcohol intake to no more than 1 drink per day for nonpregnant women. One drink equals 12 ounces of beer, 5 ounces of  wine, or 1 ounces of hard liquor.  Do not use street drugs.  Do not share needles.  Ask your health care provider for help if you need support or information about quitting drugs.  Tell your health care provider if you often feel depressed.  Tell your health care provider if you have ever been abused or do not feel safe at home. This information is not intended to replace advice given to you by your health care provider. Make sure you discuss any questions you have with your health care provider. Document Released: 12/31/2010 Document Revised: 11/23/2015 Document Reviewed: 03/21/2015 Elsevier Interactive Patient Education  [employer].

## 2017-10-10 NOTE — Assessment & Plan Note (Signed)
Undergoing lung cancer screening CT.

## 2017-10-10 NOTE — Progress Notes (Signed)
BP 124/78 (BP Location: Left Arm, Patient Position: Sitting, Cuff Size: Large)   Pulse 73   Temp 98.4 F (36.9 C) (Oral)   Ht 5\' 4"  (1.626 m)   Wt 218 lb (98.9 kg)   SpO2 95%   BMI 37.42 kg/m    CC: CPE Subjective:    Patient ID: Monica Wise, female    DOB: [birth year], 72 y.o.   MRN: [id number]  HPI: Monica Wise is a 72 y.o. female presenting on 10/10/2017 for Annual Exam (Pt 2. Requests new rx for hydrocodone.)   Saw Lesia yesterday for medicare wellness visit. Note reviewed. 1 fall - fell off porch 2 months ago.  IBS stable.   Echo 2014 - mild AI, aortic sclerosis not stenosis.   Preventative: G1P1  COLONOSCOPY Date: 2010 hyperplastic polyps identified. rpt 10 yrs ([place])  Mammogram 05/2017 birads 2  Well woman exam - remotely. Discussed, declines further eval.  Lung cancer screening - undergoing screening, reviewed CT 09/2016 - emphysema, aortic ATH, calcified CA,  DEXA Date: 09/2014 T -1.2 femur neck  Flu shot - yearly Pneumovax 2001, prevnar 09/2015, pneumovax 09/2016 zostavax 07/2014 shingrix - discussed, declines for now Advanced directive: Again reviewed with patient - son is POA. Has also discussed with local cousin. Declines bringing copy today.  Seat belt use discussed  Sunscreen use discussed. No changing moles on skin  Ex smoker - quit 2009. 30 PY hx.  Alcohol - 1-2 glasses of wine a week  Lives alone, 2 feral cats Occupation: retired, worked for [employer]: [employer] Activity: works in yard, unable to walk 2/2 knee pain  Diet: good water, fruits/vegetables daily   Relevant past medical, surgical, family and social history reviewed and updated as indicated. Interim medical history since our last visit reviewed. Allergies and medications reviewed and updated. Outpatient Medications Prior to Visit  Medication Sig Dispense Refill  . amLODipine (NORVASC) 5 MG tablet TAKE 1 TABLET (5 MG TOTAL) BY MOUTH DAILY. 90 tablet 3  . aspirin 81 MG tablet Take 81  mg by mouth as needed.     Marland Kitchen b complex vitamins tablet Take 1 tablet by mouth as needed.    Marland Kitchen CETIRIZINE HCL PO Take 1 tablet by mouth as needed.    . Cholecalciferol (VITAMIN D) 2000 units CAPS Take 1 capsule by mouth daily.    Marland Kitchen ezetimibe (ZETIA) 10 MG tablet Take 1 tablet (10 mg total) by mouth daily. MUST SCHEDULE ANNUAL WELLNESS EXAM 90 tablet 3  . famotidine (PEPCID) 20 MG tablet Take 20 mg by mouth as needed.     . furosemide (LASIX) 20 MG tablet Take 1 tablet (20 mg total) by mouth daily as needed for fluid. 30 tablet 6  . Loperamide HCl (ANTI-DIARRHEAL PO) Take by mouth.    . mometasone (ELOCON) 0.1 % cream Apply 1 application topically daily. (Patient taking differently: Apply 1 application topically as needed. ) 45 g 0  . montelukast (SINGULAIR) 10 MG tablet TAKE 1 TABLET BY MOUTH EVERY DAY IN THE EVENING 90 tablet 1  . Multiple Vitamin (MULTIVITAMIN) tablet Take 1 tablet by mouth daily.    . Omega-3 Krill Oil 500 MG CAPS Take 500 mg by mouth daily.    Marland Kitchen OVER THE COUNTER MEDICATION as needed. Ginsana    . potassium chloride (K-DUR) 10 MEQ tablet Take 1 tablet (10 mEq total) by mouth daily as needed. With lasix 30 tablet 6  . SPIRIVA HANDIHALER 18 MCG inhalation capsule PLACE  1 CAPSULE (18 MCG TOTAL) INTO INHALER AND INHALE DAILY. 30 capsule 10  . SYMBICORT 160-4.5 MCG/ACT inhaler TAKE 2 PUFFS BY MOUTH TWICE A DAY 10.2 Inhaler 3  . TURMERIC PO Take 1,000 mg by mouth daily.    Marland Kitchen albuterol (PROVENTIL HFA;VENTOLIN HFA) 108 (90 BASE) MCG/ACT inhaler Inhale 1-2 puffs into the lungs every 6 (six) hours as needed for wheezing or shortness of breath.     No facility-administered medications prior to visit.      Per HPI unless specifically indicated in ROS section below Review of Systems  Constitutional: Negative for activity change, appetite change, chills, fatigue, fever and unexpected weight change.  HENT: Positive for congestion, rhinorrhea, sinus pressure, sneezing and sore throat.  Negative for hearing loss.        Recent sinus congestion, bronchitis, allergies  Eyes: Negative for visual disturbance.  Respiratory: Positive for cough and shortness of breath. Negative for chest tightness and wheezing.   Cardiovascular: Negative for chest pain, palpitations and leg swelling.  Gastrointestinal: Negative for abdominal distention, abdominal pain, blood in stool, constipation, diarrhea, nausea and vomiting.  Genitourinary: Negative for difficulty urinating and hematuria.  Musculoskeletal: Negative for arthralgias, myalgias and neck pain.       Leg cramps of left side  Skin: Negative for rash.  Neurological: Negative for dizziness, seizures, syncope and headaches.  Hematological: Negative for adenopathy. Does not bruise/bleed easily.  Psychiatric/Behavioral: Negative for dysphoric mood. The patient is not nervous/anxious.        Objective:    BP 124/78 (BP Location: Left Arm, Patient Position: Sitting, Cuff Size: Large)   Pulse 73   Temp 98.4 F (36.9 C) (Oral)   Ht 5\' 4"  (1.626 m)   Wt 218 lb (98.9 kg)   SpO2 95%   BMI 37.42 kg/m   Wt Readings from Last 3 Encounters:  10/10/17 218 lb (98.9 kg)  10/09/17 217 lb 4 oz (98.5 kg)  06/11/17 215 lb (97.5 kg)    Physical Exam  Constitutional: She is oriented to person, place, and time. She appears well-developed and well-nourished. No distress.  HENT:  Head: Normocephalic and atraumatic.  Right Ear: Hearing, tympanic membrane, external ear and ear canal normal.  Left Ear: Hearing, tympanic membrane, external ear and ear canal normal.  Nose: Nose normal.  Mouth/Throat: Uvula is midline, oropharynx is clear and moist and mucous membranes are normal. No oropharyngeal exudate, posterior oropharyngeal edema or posterior oropharyngeal erythema.  Eyes: Pupils are equal, round, and reactive to light. Conjunctivae and EOM are normal. No scleral icterus.  Neck: Normal range of motion. Neck supple. Carotid bruit is present  (referred from murmu). No thyromegaly present.  Cardiovascular: Normal rate, regular rhythm and intact distal pulses.  Murmur (3/6 systolic at USB) heard. Pulses:      Radial pulses are 2+ on the right side, and 2+ on the left side.  Pulmonary/Chest: Effort normal and breath sounds normal. No respiratory distress. She has no wheezes. She has no rales.  Abdominal: Soft. Bowel sounds are normal. She exhibits no distension and no mass. There is no tenderness. There is no rebound and no guarding.  Musculoskeletal: Normal range of motion. She exhibits no edema.  Lymphadenopathy:    She has no cervical adenopathy.  Neurological: She is alert and oriented to person, place, and time.  CN grossly intact, station and gait intact  Skin: Skin is warm and dry. No rash noted.  Psychiatric: She has a normal mood and affect. Her behavior is  normal. Judgment and thought content normal.  Nursing note and vitals reviewed.  Results for orders placed or performed in visit on 10/09/17  TSH  Result Value Ref Range   TSH 1.41 0.35 - 4.50 uIU/mL  Comprehensive metabolic panel  Result Value Ref Range   Sodium 143 135 - 145 mEq/L   Potassium 4.0 3.5 - 5.1 mEq/L   Chloride 105 96 - 112 mEq/L   CO2 29 19 - 32 mEq/L   Glucose, Bld 108 (H) 70 - 99 mg/dL   BUN 16 6 - 23 mg/dL   Creatinine, Ser 1.05 0.40 - 1.20 mg/dL   Total Bilirubin 0.6 0.2 - 1.2 mg/dL   Alkaline Phosphatase 67 39 - 117 U/L   AST 15 0 - 37 U/L   ALT 15 0 - 35 U/L   Total Protein 7.3 6.0 - 8.3 g/dL   Albumin 4.1 3.5 - 5.2 g/dL   Calcium 9.4 8.4 - 10.5 mg/dL   GFR 54.51 (L) >60.00 mL/min  Lipid panel  Result Value Ref Range   Cholesterol 186 0 - 200 mg/dL   Triglycerides 69.0 0.0 - 149.0 mg/dL   HDL 57.70 >39.00 mg/dL   VLDL 13.8 0.0 - 40.0 mg/dL   LDL Cholesterol 115 (H) 0 - 99 mg/dL   Total CHOL/HDL Ratio 3    NonHDL 128.50       Assessment & Plan:   Problem List Items Addressed This Visit    Advanced care planning/counseling  discussion    Advanced directive: Again reviewed with patient - son is POA. Has also discussed with local cousin. Declines bringing copy today.       Aortic valve sclerosis    Latest echo 2014 showed aortic valve sclerosis not stenosis and mild aortic insufficiency.      Centrilobular emphysema (HCC)    Continue symbicort, spiriva. Albuterol refilled.       Relevant Medications   albuterol (PROVENTIL HFA;VENTOLIN HFA) 108 (90 Base) MCG/ACT inhaler   Coronary artery calcification seen on CAT scan    Continue zetia, aspirin.      Essential hypertension    Chronic, stable. Continue current regimen.       Ex-smoker    Undergoing lung cancer screening CT.       Health maintenance examination - Primary    Preventative protocols reviewed and updated unless pt declined. Discussed healthy diet and lifestyle.       Hyperlipidemia    Chronic, stable. Continue current regimen of zetia and fish oil. Congratulated on improved readings. Pt very happy The 10-year ASCVD risk score Mikey Bussing DC Jr., et al., 2013) is: 16%   Values used to calculate the score:     Age: 57 years     Sex: Female     Is Non-Hispanic African American: No     Diabetic: No     Tobacco smoker: No     Systolic Blood Pressure: 578 mmHg     Is BP treated: Yes     HDL Cholesterol: 57.7 mg/dL     Total Cholesterol: 186 mg/dL       Severe obesity (BMI 35.0-39.9) with comorbidity ([place])    Continue to encourage healthy diet and lifestyle changes for sustainable weight loss.           Meds ordered this encounter  Medications  . albuterol (PROVENTIL HFA;VENTOLIN HFA) 108 (90 Base) MCG/ACT inhaler    Sig: Inhale 1-2 puffs into the lungs every 6 (six) hours as needed for wheezing  or shortness of breath.    Dispense:  18 g    Refill:  3   No orders of the defined types were placed in this encounter.   Follow up plan: Return in about 1 year (around 10/11/2018) for medicare wellness visit, annual exam, prior fasting  for blood work.  Ria Bush, MD

## 2017-10-10 NOTE — Assessment & Plan Note (Signed)
Continue zetia, aspirin.

## 2017-10-14 ENCOUNTER — Other Ambulatory Visit: Payer: Self-pay | Admitting: Family Medicine

## 2017-10-14 DIAGNOSIS — M170 Bilateral primary osteoarthritis of knee: Secondary | ICD-10-CM

## 2017-10-14 NOTE — Telephone Encounter (Signed)
Copied from [place]. Topic: Quick Communication - Rx Refill/Question >> Oct 14, 2017  1:00 PM Bea Graff, NT wrote: Medication: hydrocodone (pt states at her appt on 10/10/17 this was suppose to have been called in) Has the patient contacted their pharmacy? Yes.   (Agent: If no, request that the patient contact the pharmacy for the refill.) Preferred Pharmacy (with phone number or street name): [id number] [address], [place] [phone] (Phone) [phone] (Fax)     Agent: Please be advised that RX refills may take up to 3 business days. We ask that you follow-up with your pharmacy.

## 2017-10-14 NOTE — Telephone Encounter (Signed)
  Pt requesting refill Hydrocodone apap  Last refilled and qty; do not see recent fill of med. Last seen:10/10/17 [facility]: do not see recent pain mgt lab.

## 2017-10-14 NOTE — Telephone Encounter (Signed)
Hydrocodone 5-325 mg 1 tab q6h prn moderate pain. Patient says she takes for chronic knee pain.  Last OV:10/10/17 PCP: [place]: [id number] [address], [place] [phone] (Phone) [phone] (Fax)

## 2017-10-15 MED ORDER — HYDROCODONE-ACETAMINOPHEN 5-325 MG PO TABS: 1.0000 | ORAL_TABLET | Freq: Four times a day (QID) | ORAL | 0 refills | Status: DC | PRN

## 2017-10-15 NOTE — Telephone Encounter (Signed)
Sent in. Rare use [place] CSRS reviewed.

## 2017-10-16 ENCOUNTER — Ambulatory Visit
Admission: RE | Admit: 2017-10-16 | Discharge: 2017-10-16 | Disposition: A | Discharge status: Home / Self Care | Payer: Medicare Other | Source: Ambulatory Visit | Attending: Nurse Practitioner | Admitting: Nurse Practitioner

## 2017-10-16 DIAGNOSIS — Z87891 Personal history of nicotine dependence: Secondary | ICD-10-CM | POA: Insufficient documentation

## 2017-10-16 DIAGNOSIS — I700 Atherosclerosis of aorta: Secondary | ICD-10-CM | POA: Insufficient documentation

## 2017-10-16 DIAGNOSIS — D3501 Benign neoplasm of right adrenal gland: Secondary | ICD-10-CM | POA: Insufficient documentation

## 2017-10-16 DIAGNOSIS — J438 Other emphysema: Secondary | ICD-10-CM | POA: Diagnosis not present

## 2017-10-16 DIAGNOSIS — I2510 Atherosclerotic heart disease of native coronary artery without angina pectoris: Secondary | ICD-10-CM | POA: Insufficient documentation

## 2017-10-16 DIAGNOSIS — Z122 Encounter for screening for malignant neoplasm of respiratory organs: Secondary | ICD-10-CM | POA: Insufficient documentation

## 2017-10-20 ENCOUNTER — Encounter: Payer: Self-pay | Admitting: *Deleted

## 2017-11-06 ENCOUNTER — Encounter: Payer: Self-pay | Admitting: Family Medicine

## 2017-11-06 DIAGNOSIS — S32010A Wedge compression fracture of first lumbar vertebra, initial encounter for closed fracture: Secondary | ICD-10-CM | POA: Insufficient documentation

## 2018-01-08 DIAGNOSIS — I6523 Occlusion and stenosis of bilateral carotid arteries: Secondary | ICD-10-CM | POA: Diagnosis not present

## 2018-01-08 DIAGNOSIS — I359 Nonrheumatic aortic valve disorder, unspecified: Secondary | ICD-10-CM | POA: Diagnosis not present

## 2018-01-08 DIAGNOSIS — I10 Essential (primary) hypertension: Secondary | ICD-10-CM | POA: Diagnosis not present

## 2018-01-12 ENCOUNTER — Other Ambulatory Visit: Payer: Self-pay | Admitting: Family Medicine

## 2018-01-12 NOTE — Telephone Encounter (Signed)
Refills sent

## 2018-01-22 DIAGNOSIS — I359 Nonrheumatic aortic valve disorder, unspecified: Secondary | ICD-10-CM | POA: Diagnosis not present

## 2018-02-06 ENCOUNTER — Other Ambulatory Visit: Payer: Self-pay | Admitting: Family Medicine

## 2018-03-03 ENCOUNTER — Telehealth: Payer: Self-pay

## 2018-03-03 NOTE — Telephone Encounter (Signed)
Copied from [place] [phone]. Topic: General - Other >> Mar 03, 2018  9:35 AM Ivar Drape wrote: Reason for CRM:  Shelton Silvas w/[employer] [phone] would like to verify that you received a fax regarding Statin Therapy.  Please advise.

## 2018-03-05 NOTE — Telephone Encounter (Signed)
Spoke with [facility] informing them we have not received a fax for this pt about statin therapy.  States they will fax info.

## 2018-03-31 ENCOUNTER — Other Ambulatory Visit: Payer: Self-pay | Admitting: Family Medicine

## 2018-04-23 ENCOUNTER — Ambulatory Visit: Payer: Medicare Other

## 2018-05-14 ENCOUNTER — Encounter: Payer: Self-pay | Admitting: Family Medicine

## 2018-05-14 ENCOUNTER — Ambulatory Visit (INDEPENDENT_AMBULATORY_CARE_PROVIDER_SITE_OTHER): Payer: Medicare Other | Admitting: Family Medicine

## 2018-05-14 VITALS — BP 150/76 | HR 73 | Temp 98.4°F | Ht 64.0 in | Wt 211.5 lb

## 2018-05-14 DIAGNOSIS — J22 Unspecified acute lower respiratory infection: Secondary | ICD-10-CM | POA: Diagnosis not present

## 2018-05-14 DIAGNOSIS — J432 Centrilobular emphysema: Secondary | ICD-10-CM | POA: Diagnosis not present

## 2018-05-14 MED ORDER — FLUCONAZOLE 150 MG PO TABS: 150.0000 mg | ORAL_TABLET | Freq: Once | ORAL | 0 refills | Status: AC

## 2018-05-14 MED ORDER — DOXYCYCLINE HYCLATE 100 MG PO TABS: 100.0000 mg | ORAL_TABLET | Freq: Two times a day (BID) | ORAL | 0 refills | Status: DC

## 2018-05-14 NOTE — Progress Notes (Signed)
BP (!) 150/76 (BP Location: Right Arm, Patient Position: Sitting, Cuff Size: Large)   Pulse 73   Temp 98.4 F (36.9 C) (Oral)   Ht 5\' 4"  (1.626 m)   Wt 211 lb 8 oz (95.9 kg)   SpO2 97%   BMI 36.30 kg/m    CC: ST Subjective:    Patient ID: Monica Wise, female    DOB: 1946-01-08, 72 y.o.   MRN: [id number]  HPI: Monica Wise is a 72 y.o. female presenting on 05/14/2018 for Sore Throat (C/o burning sore throat, swollen glands and facial pain. Sxs started about 3 days ago. Tried ibuprofen, barely helpful. )   3d h/o ST and burning throat pain, sore glands, facial soreness of left cheek. Has felt feverish. Mild PNDrainage, cough that is worse at night time. Increased dyspnea.   No chills, ear or tooth pain, wheezing, body aches.  No sick contacts at home.  Has tried ibuprofen for this without benefit.  Known asthma and COPD on symbicort, spiriva, singulair and prn albuterol, has not been using this recently. Ex smoker   Relevant past medical, surgical, family and social history reviewed and updated as indicated. Interim medical history since our last visit reviewed. Allergies and medications reviewed and updated. Outpatient Medications Prior to Visit  Medication Sig Dispense Refill  . albuterol (PROVENTIL HFA;VENTOLIN HFA) 108 (90 Base) MCG/ACT inhaler Inhale 1-2 puffs into the lungs every 6 (six) hours as needed for wheezing or shortness of breath. 18 g 3  . amLODipine (NORVASC) 5 MG tablet TAKE 1 TABLET BY MOUTH EVERY DAY 90 tablet 2  . b complex vitamins tablet Take 1 tablet by mouth as needed.    Marland Kitchen CETIRIZINE HCL PO Take 1 tablet by mouth as needed.    . Cholecalciferol (VITAMIN D) 2000 units CAPS Take 1 capsule by mouth daily.    Marland Kitchen ezetimibe (ZETIA) 10 MG tablet Take 1 tablet (10 mg total) by mouth daily. 90 tablet 2  . famotidine (PEPCID) 20 MG tablet Take 20 mg by mouth as needed.     . furosemide (LASIX) 20 MG tablet Take 1 tablet (20 mg total) by mouth daily as needed for  fluid. 30 tablet 6  . HYDROcodone-acetaminophen (NORCO/VICODIN) 5-325 MG tablet Take 1 tablet by mouth every 6 (six) hours as needed for moderate pain. 20 tablet 0  . Loperamide HCl (ANTI-DIARRHEAL PO) Take by mouth.    . mometasone (ELOCON) 0.1 % cream Apply 1 application topically daily. (Patient taking differently: Apply 1 application topically as needed. ) 45 g 0  . montelukast (SINGULAIR) 10 MG tablet TAKE 1 TABLET BY MOUTH EVERY DAY IN THE EVENING 90 tablet 1  . Multiple Vitamin (MULTIVITAMIN) tablet Take 1 tablet by mouth daily.    . potassium chloride (K-DUR) 10 MEQ tablet Take 1 tablet (10 mEq total) by mouth daily as needed. With lasix 30 tablet 6  . SPIRIVA HANDIHALER 18 MCG inhalation capsule PLACE 1 CAPSULE (18 MCG TOTAL) INTO INHALER AND INHALE DAILY. 30 capsule 10  . SYMBICORT 160-4.5 MCG/ACT inhaler TAKE 2 PUFFS BY MOUTH TWICE A DAY 10.2 Inhaler 4  . TURMERIC PO Take 1,000 mg by mouth daily.    Marland Kitchen aspirin 81 MG tablet Take 81 mg by mouth as needed.     . Omega-3 Krill Oil 500 MG CAPS Take 500 mg by mouth daily.    Marland Kitchen OVER THE COUNTER MEDICATION as needed. Ginsana     No facility-administered medications prior to  visit.      Per HPI unless specifically indicated in ROS section below Review of Systems     Objective:    BP (!) 150/76 (BP Location: Right Arm, Patient Position: Sitting, Cuff Size: Large)   Pulse 73   Temp 98.4 F (36.9 C) (Oral)   Ht 5\' 4"  (1.626 m)   Wt 211 lb 8 oz (95.9 kg)   SpO2 97%   BMI 36.30 kg/m   Wt Readings from Last 3 Encounters:  05/14/18 211 lb 8 oz (95.9 kg)  10/16/17 217 lb (98.4 kg)  10/10/17 218 lb (98.9 kg)    Physical Exam  Constitutional: She appears well-developed and well-nourished. No distress.  Fatigued, tired appearing   HENT:  Head: Normocephalic and atraumatic.  Right Ear: Hearing, tympanic membrane, external ear and ear canal normal.  Left Ear: Hearing, tympanic membrane, external ear and ear canal normal.  Nose: No  mucosal edema or rhinorrhea. Right sinus exhibits no maxillary sinus tenderness and no frontal sinus tenderness. Left sinus exhibits maxillary sinus tenderness. Left sinus exhibits no frontal sinus tenderness.  Mouth/Throat: Uvula is midline and mucous membranes are normal. Posterior oropharyngeal erythema present. No oropharyngeal exudate, posterior oropharyngeal edema or tonsillar abscesses.  Eyes: Pupils are equal, round, and reactive to light. Conjunctivae and EOM are normal. No scleral icterus.  Neck: Normal range of motion. Neck supple.  Cardiovascular: Normal rate, regular rhythm, normal heart sounds and intact distal pulses.  No murmur heard. Pulmonary/Chest: Effort normal and breath sounds normal. No respiratory distress. She has no wheezes. She has no rales.  Lungs clear  Lymphadenopathy:    She has no cervical adenopathy.  Skin: Skin is warm and dry. No rash noted.  Nursing note and vitals reviewed.     Assessment & Plan:   Problem List Items Addressed This Visit    Centrilobular emphysema ([place])   Acute respiratory infection - Primary    Anticipate viral URI with cough. Supportive care reviewed.  rec start albuterol inhaler PRN (had not been using), discussed delsym use, and continued ibuprofen. Given comorbidities, WASP for doxycycline provided with indications when to fill. She requests diflucan as well.       Relevant Medications   fluconazole (DIFLUCAN) 150 MG tablet       Meds ordered this encounter  Medications  . doxycycline (VIBRA-TABS) 100 MG tablet    Sig: Take 1 tablet (100 mg total) by mouth 2 (two) times daily.    Dispense:  20 tablet    Refill:  0  . fluconazole (DIFLUCAN) 150 MG tablet    Sig: Take 1 tablet (150 mg total) by mouth once for 1 dose.    Dispense:  1 tablet    Refill:  0   No orders of the defined types were placed in this encounter.   Follow up plan: Return if symptoms worsen or fail to improve.  Ria Bush, MD

## 2018-05-14 NOTE — Assessment & Plan Note (Signed)
Anticipate viral URI with cough. Supportive care reviewed.  rec start albuterol inhaler PRN (had not been using), discussed delsym use, and continued ibuprofen. Given comorbidities, WASP for doxycycline provided with indications when to fill. She requests diflucan as well.

## 2018-05-14 NOTE — Patient Instructions (Addendum)
You have a viral upper respiratory infection. Antibiotics are not needed for this. Viral infections usually take 7-10 days to resolve. The cough can last a few weeks to go away.  Take delsym over the counter cough syrup at night time.  Push fluids and plenty of rest. Restart albuterol inhaler.  If fever >101, worsening productive cough or shortness of breath, or ongoing symptoms past 7-10 days, fil antibiotic provided today (doxycycline + diflucan).  Call clinic with questions.  Good to see you today. I hope you start feeling better soon.

## 2018-05-19 ENCOUNTER — Telehealth: Payer: Self-pay | Admitting: *Deleted

## 2018-05-19 MED ORDER — BENZONATATE 100 MG PO CAPS: 100.0000 mg | ORAL_CAPSULE | Freq: Three times a day (TID) | ORAL | 0 refills | Status: DC | PRN

## 2018-05-19 NOTE — Telephone Encounter (Signed)
Patient called stating that she was seen last week and still has a cough. The OTC cough medication is not working. Patient stated that she would like some Tessalon pills sent in to help with the cough. Pharmacy- [facility]

## 2018-05-19 NOTE — Telephone Encounter (Signed)
Spoke with pt notifying her rx was sent to pharmacy.  Pt verbalizes understanding and expresses her thanks.

## 2018-05-19 NOTE — Telephone Encounter (Signed)
Tessalon perls sent to pharmacy

## 2018-05-30 ENCOUNTER — Other Ambulatory Visit: Payer: Self-pay | Admitting: Family Medicine

## 2018-06-08 DIAGNOSIS — Z1231 Encounter for screening mammogram for malignant neoplasm of breast: Secondary | ICD-10-CM | POA: Diagnosis not present

## 2018-06-09 ENCOUNTER — Encounter: Payer: Self-pay | Admitting: General Surgery

## 2018-06-16 ENCOUNTER — Other Ambulatory Visit: Payer: Self-pay

## 2018-06-16 ENCOUNTER — Encounter: Payer: Self-pay | Admitting: General Surgery

## 2018-06-16 ENCOUNTER — Ambulatory Visit: Payer: Medicare Other | Admitting: General Surgery

## 2018-06-16 VITALS — BP 144/70 | HR 72 | Temp 97.9°F | Resp 16 | Ht 64.0 in | Wt 210.0 lb

## 2018-06-16 DIAGNOSIS — Z1239 Encounter for other screening for malignant neoplasm of breast: Secondary | ICD-10-CM

## 2018-06-16 NOTE — Progress Notes (Signed)
Patient ID: Monica Wise, female   DOB: [birth year], 72 y.o.   MRN: [id number]  Chief Complaint  Patient presents with  . Follow-up    HPI Monica Wise is a 72 y.o. female who presents for a breast evaluation. The most recent mammogram was done on 06/08/2018.  Patient does perform regular self breast checks and gets regular mammograms done.     HPI  Past Medical History:  Diagnosis Date  . Adrenal adenoma, right 10/18/2016   Incidental by CT 09/2016  . Aortic valve sclerosis 2014   withoout stenosis, mild aortic insufficiency - Dr Ubaldo Glassing  . Asthma   . CAD (coronary artery disease) 10/18/2016   By CT 09/2016  . Centrilobular emphysema ([place]) 10/18/2016   By CT 09/2016  . Chronic diarrhea 2011   worse in am, ?IBS  . COPD (chronic obstructive pulmonary disease) ([place]) 2005   chronic bronchitis - spirometry FVC 70%, FEV1 72%, abnormal loop volume (unknown date)  . Essential hypertension   . Ex-smoker   . GERD (gastroesophageal reflux disease)   . History of chicken pox   . History of measles   . Hyperlipidemia   . IBS (irritable bowel syndrome)   . Lumbago   . Lump or mass in breast 2012   right breast US guided finesse 1 o'clock  . Obesity   . Osteoarthritis    knees s/p surgeries  . Osteopenia 09/2014   mild by DEXA (-1.2 femur neck)  . Perennial allergic rhinitis     Past Surgical History:  Procedure Laterality Date  . BREAST BIOPSY Right 2012  . BREAST LUMPECTOMY Left 1995   benign mass  . CARDIOVASCULAR STRESS TEST  12/2004   nl myoview, preserved LV fxn with EF 71% without reversible ischemia ([place])  . COLONOSCOPY  2010   hyperplastic polyps identified. rpt 10 yrs ([place])  . DEXA  09/2014   T -1.2 femur neck  . ESOPHAGOGASTRODUODENOSCOPY  2010   [facility]  . KNEE SURGERY Right 2010   arthroscopic  . KNEE SURGERY Left 2014    Family History  Problem Relation Age of Onset  . Diverticulitis Mother   . Heart failure Mother   . Cancer Maternal Aunt        bone, and  unsure  . CAD Father 68       MI x3  . Sudden death Brother 27    Social History Social History   Tobacco Use  . Smoking status: Former Smoker    Packs/day: 1.00    Years: 30.00    Pack years: 30.00    Types: Cigarettes    Last attempt to quit: 05/31/2008    Years since quitting: 10.0  . Smokeless tobacco: Never Used  Substance Use Topics  . Alcohol use: Yes    Alcohol/week: 0.0 standard drinks    Comment: ocassionally wine  . Drug use: No    Allergies  Allergen Reactions  . Altace [Ramipril]     unknown  . Benicar [Olmesartan]     unknown  . Codeine Nausea And Vomiting  . Demerol [Meperidine] Nausea And Vomiting  . Statins Other (See Comments)    Myalgias to crestor, refused further statins  . Sulfa Antibiotics     Unknown reaction per patient    Current Outpatient Medications  Medication Sig Dispense Refill  . albuterol (PROVENTIL HFA;VENTOLIN HFA) 108 (90 Base) MCG/ACT inhaler Inhale 1-2 puffs into the lungs every 6 (six) hours as needed for wheezing or shortness  of breath. 18 g 3  . amLODipine (NORVASC) 5 MG tablet TAKE 1 TABLET BY MOUTH EVERY DAY 90 tablet 2  . b complex vitamins tablet Take 1 tablet by mouth as needed.    . benzonatate (TESSALON) 100 MG capsule Take 1 capsule (100 mg total) by mouth 3 (three) times daily as needed for cough. 30 capsule 0  . CETIRIZINE HCL PO Take 1 tablet by mouth as needed.    . Cholecalciferol (VITAMIN D) 2000 units CAPS Take 1 capsule by mouth daily.    Marland Kitchen doxycycline (VIBRA-TABS) 100 MG tablet Take 1 tablet (100 mg total) by mouth 2 (two) times daily. 20 tablet 0  . ezetimibe (ZETIA) 10 MG tablet Take 1 tablet (10 mg total) by mouth daily. 90 tablet 2  . famotidine (PEPCID) 20 MG tablet Take 20 mg by mouth as needed.     . furosemide (LASIX) 20 MG tablet Take 1 tablet (20 mg total) by mouth daily as needed for fluid. 30 tablet 6  . HYDROcodone-acetaminophen (NORCO/VICODIN) 5-325 MG tablet Take 1 tablet by mouth every 6 (six)  hours as needed for moderate pain. 20 tablet 0  . Loperamide HCl (ANTI-DIARRHEAL PO) Take by mouth.    . mometasone (ELOCON) 0.1 % cream Apply 1 application topically daily. (Patient taking differently: Apply 1 application topically as needed. ) 45 g 0  . montelukast (SINGULAIR) 10 MG tablet TAKE 1 TABLET BY MOUTH EVERY DAY IN THE EVENING 90 tablet 1  . Multiple Vitamin (MULTIVITAMIN) tablet Take 1 tablet by mouth daily.    . potassium chloride (K-DUR) 10 MEQ tablet Take 1 tablet (10 mEq total) by mouth daily as needed. With lasix 30 tablet 6  . SPIRIVA HANDIHALER 18 MCG inhalation capsule PLACE 1 CAPSULE (18 MCG TOTAL) INTO INHALER AND INHALE DAILY. 30 capsule 10  . SYMBICORT 160-4.5 MCG/ACT inhaler TAKE 2 PUFFS BY MOUTH TWICE A DAY 10.2 Inhaler 4  . TURMERIC PO Take 1,000 mg by mouth daily.     No current facility-administered medications for this visit.     Review of Systems Review of Systems  Constitutional: Negative.   Respiratory: Negative.   Cardiovascular: Negative.     Blood pressure (!) 144/70, pulse 72, temperature 97.9 F (36.6 C), temperature source Skin, resp. rate 16, height 5\' 4"  (1.626 m), weight 210 lb (95.3 kg).  Physical Exam Physical Exam Constitutional:      Appearance: She is well-developed.  Eyes:     General: No scleral icterus.    Conjunctiva/sclera: Conjunctivae normal.  Neck:     Musculoskeletal: Neck supple.  Cardiovascular:     Rate and Rhythm: Normal rate and regular rhythm.     Heart sounds: Murmur present. Systolic murmur present with a grade of 2/6.  Pulmonary:     Effort: Pulmonary effort is normal.     Breath sounds: Normal breath sounds.  Chest:     Breasts:        Right: No inverted nipple, mass, nipple discharge, skin change or tenderness.        Left: No inverted nipple, mass, nipple discharge, skin change or tenderness.    Lymphadenopathy:     Cervical: No cervical adenopathy.  Skin:    General: Skin is warm and dry.   Neurological:     Mental Status: She is alert and oriented to person, place, and time.     Data Reviewed Bilateral screening mammograms dated June 09, 2018 completed at [facility] were reviewed.  [facility]-RADS-2.  Assessment    Stable breast exam.  Unchanged carotid bruits with previous ultrasound failing to show significant hemodynamic stenosis.  Candidate for screening colonoscopy in 2020.    Plan The patient desires to continue annual breast exams through this office.  Patient will be asked to return to the office in one year with a bilateral screening mammogram @ [facility] . The patient is aware to call back for any questions or concerns.    HPI, Physical Exam, Assessment and Plan have been scribed under the direction and in the presence of Hervey Ard, MD.  Gaspar Cola, CMA  I have completed the exam and reviewed the above documentation for accuracy and completeness.  I agree with the above.  Haematologist has been used and any errors in dictation or transcription are unintentional.  Hervey Ard, M.D., F.A.C.S.  Forest Gleason Kvion Shapley 06/17/2018, 6:19 AM

## 2018-06-16 NOTE — Patient Instructions (Signed)
Patient will be asked to return to the office in one year with a bilateral screening mammogram @ [facility] . The patient is aware to call back for any questions or concerns.

## 2018-06-29 ENCOUNTER — Other Ambulatory Visit: Payer: Self-pay

## 2018-06-29 MED ORDER — HYDROCODONE-ACETAMINOPHEN 5-325 MG PO TABS: 1.0000 | ORAL_TABLET | Freq: Four times a day (QID) | ORAL | 0 refills | Status: DC | PRN

## 2018-06-29 NOTE — Telephone Encounter (Signed)
Name of Medication: hydrocodone apap 5-325 mg Name of Pharmacy: [facility] Date and Quantity: # 20 on 10/15/17 Last Office Visit and Type: acute on 05/14/18 & CPX on 10/10/17 Next Office Visit and Type:CPX on 10/21/18  Last Controlled Substance Agreement Date: none Last VOP:FYTW  Pt uses the hydrocodone apap for knee pain. Pt only has one pill left and request refill today.

## 2018-06-29 NOTE — Telephone Encounter (Signed)
E prescribed plz notify this was sent in today.

## 2018-06-29 NOTE — Telephone Encounter (Signed)
Spoke with pt notifying her Dr. Darnell Level sent in refill.  Pt verbalizes understanding.

## 2018-08-06 DIAGNOSIS — R6889 Other general symptoms and signs: Secondary | ICD-10-CM | POA: Diagnosis not present

## 2018-08-25 ENCOUNTER — Encounter: Payer: Self-pay | Admitting: Primary Care

## 2018-08-25 ENCOUNTER — Ambulatory Visit (INDEPENDENT_AMBULATORY_CARE_PROVIDER_SITE_OTHER): Payer: Medicare Other | Admitting: Primary Care

## 2018-08-25 VITALS — BP 126/76 | HR 74 | Temp 98.0°F | Ht 64.0 in | Wt 214.0 lb

## 2018-08-25 DIAGNOSIS — B9789 Other viral agents as the cause of diseases classified elsewhere: Secondary | ICD-10-CM | POA: Diagnosis not present

## 2018-08-25 DIAGNOSIS — J329 Chronic sinusitis, unspecified: Secondary | ICD-10-CM

## 2018-08-25 DIAGNOSIS — J3489 Other specified disorders of nose and nasal sinuses: Secondary | ICD-10-CM | POA: Diagnosis not present

## 2018-08-25 MED ORDER — METHYLPREDNISOLONE ACETATE 80 MG/ML IJ SUSP
80.0000 mg | Freq: Once | INTRAMUSCULAR | Status: AC
  Administered 2018-08-25: 80 mg via INTRAMUSCULAR

## 2018-08-25 MED ORDER — FLUTICASONE PROPIONATE 50 MCG/ACT NA SUSP: 1.0000 | Freq: Two times a day (BID) | NASAL | 0 refills | Status: DC

## 2018-08-25 NOTE — Progress Notes (Signed)
Subjective:    Patient ID: Monica Wise, female    DOB: 23-May-1946, 73 y.o.   MRN: [id number]  HPI  Monica Wise is a 73 year old female with a history of emphysema, GERD, tobacco abuse who presents today with a chief complaint of nasal congestion.  She also reports headache, sinus pressure, facial pain, sore throat. Her symptoms began 4-5 days ago. She's taken Excedrin Migraine due to headache, some improvement. She denies fevers, rhinorrhea, thick mucous from her nasal cavity, changes in shortness of breath. She does not take antihistamines as they cause nausea. She is not using nasal sprays.   Review of Systems  Constitutional: Negative for fatigue and fever.  HENT: Positive for congestion, sinus pressure and sinus pain.   Respiratory: Negative for shortness of breath and wheezing.   Cardiovascular: Negative for chest pain.       Past Medical History:  Diagnosis Date  . Adrenal adenoma, right 10/18/2016   Incidental by CT 09/2016  . Aortic valve sclerosis 2014   withoout stenosis, mild aortic insufficiency - Dr Ubaldo Glassing  . Asthma   . CAD (coronary artery disease) 10/18/2016   By CT 09/2016  . Centrilobular emphysema ([place]) 10/18/2016   By CT 09/2016  . Chronic diarrhea 2011   worse in am, ?IBS  . COPD (chronic obstructive pulmonary disease) ([place]) 2005   chronic bronchitis - spirometry FVC 70%, FEV1 72%, abnormal loop volume (unknown date)  . Essential hypertension   . Ex-smoker   . GERD (gastroesophageal reflux disease)   . History of chicken pox   . History of measles   . Hyperlipidemia   . IBS (irritable bowel syndrome)   . Lumbago   . Lump or mass in breast 2012   right breast US guided finesse 1 o'clock  . Obesity   . Osteoarthritis    knees s/p surgeries  . Osteopenia 09/2014   mild by DEXA (-1.2 femur neck)  . Perennial allergic rhinitis      Social History   Socioeconomic History  . Marital status: Divorced    Spouse name: Not on file  . Number of children: Not on  file  . Years of education: Not on file  . Highest education level: Not on file  Occupational History  . Not on file  Social Needs  . Financial resource strain: Not on file  . Food insecurity:    Worry: Not on file    Inability: Not on file  . Transportation needs:    Medical: Not on file    Non-medical: Not on file  Tobacco Use  . Smoking status: Former Smoker    Packs/day: 1.00    Years: 30.00    Pack years: 30.00    Types: Cigarettes    Last attempt to quit: 05/31/2008    Years since quitting: 10.2  . Smokeless tobacco: Never Used  Substance and Sexual Activity  . Alcohol use: Yes    Alcohol/week: 0.0 standard drinks    Comment: ocassionally wine  . Drug use: No  . Sexual activity: Not Currently  Lifestyle  . Physical activity:    Days per week: Not on file    Minutes per session: Not on file  . Stress: Not on file  Relationships  . Social connections:    Talks on phone: Not on file    Gets together: Not on file    Attends religious service: Not on file    Active member of club or organization: Not  on file    Attends meetings of clubs or organizations: Not on file    Relationship status: Not on file  . Intimate partner violence:    Fear of current or ex partner: Not on file    Emotionally abused: Not on file    Physically abused: Not on file    Forced sexual activity: Not on file  Other Topics Concern  . Not on file  Social History Narrative   Lives alone, 2 feral cats   Occupation: retired, worked for [employer]   Edu: [employer]   Activity: works in yard, unable to walk 2/2 knee pain   Diet: good water, fruits/vegetables daily    Past Surgical History:  Procedure Laterality Date  . BREAST BIOPSY Right 2012  . BREAST LUMPECTOMY Left 1995   benign mass  . CARDIOVASCULAR STRESS TEST  12/2004   nl myoview, preserved LV fxn with EF 71% without reversible ischemia ([place])  . COLONOSCOPY  2010   hyperplastic polyps identified. rpt 10 yrs ([place])  . DEXA   09/2014   T -1.2 femur neck  . ESOPHAGOGASTRODUODENOSCOPY  2010   [place]  . KNEE SURGERY Right 2010   arthroscopic  . KNEE SURGERY Left 2014    Family History  Problem Relation Age of Onset  . Diverticulitis Mother   . Heart failure Mother   . Cancer Maternal Aunt        bone, and unsure  . CAD Father 31       MI x3  . Sudden death Brother 109    Allergies  Allergen Reactions  . Altace [Ramipril]     unknown  . Benicar [Olmesartan]     unknown  . Codeine Nausea And Vomiting  . Demerol [Meperidine] Nausea And Vomiting  . Statins Other (See Comments)    Myalgias to crestor, refused further statins  . Sulfa Antibiotics     Unknown reaction per patient    Current Outpatient Medications on File Prior to Visit  Medication Sig Dispense Refill  . albuterol (PROVENTIL HFA;VENTOLIN HFA) 108 (90 Base) MCG/ACT inhaler Inhale 1-2 puffs into the lungs every 6 (six) hours as needed for wheezing or shortness of breath. 18 g 3  . amLODipine (NORVASC) 5 MG tablet TAKE 1 TABLET BY MOUTH EVERY DAY 90 tablet 2  . b complex vitamins tablet Take 1 tablet by mouth as needed.    . benzonatate (TESSALON) 100 MG capsule Take 1 capsule (100 mg total) by mouth 3 (three) times daily as needed for cough. 30 capsule 0  . CETIRIZINE HCL PO Take 1 tablet by mouth as needed.    . Cholecalciferol (VITAMIN D) 2000 units CAPS Take 1 capsule by mouth daily.    Marland Kitchen doxycycline (VIBRA-TABS) 100 MG tablet Take 1 tablet (100 mg total) by mouth 2 (two) times daily. 20 tablet 0  . ezetimibe (ZETIA) 10 MG tablet Take 1 tablet (10 mg total) by mouth daily. 90 tablet 2  . famotidine (PEPCID) 20 MG tablet Take 20 mg by mouth as needed.     . furosemide (LASIX) 20 MG tablet Take 1 tablet (20 mg total) by mouth daily as needed for fluid. 30 tablet 6  . HYDROcodone-acetaminophen (NORCO/VICODIN) 5-325 MG tablet Take 1 tablet by mouth every 6 (six) hours as needed for moderate pain. 20 tablet 0  . Loperamide HCl  (ANTI-DIARRHEAL PO) Take by mouth.    . mometasone (ELOCON) 0.1 % cream Apply 1 application topically daily. (Patient taking differently:  Apply 1 application topically as needed. ) 45 g 0  . montelukast (SINGULAIR) 10 MG tablet TAKE 1 TABLET BY MOUTH EVERY DAY IN THE EVENING 90 tablet 1  . Multiple Vitamin (MULTIVITAMIN) tablet Take 1 tablet by mouth daily.    . potassium chloride (K-DUR) 10 MEQ tablet Take 1 tablet (10 mEq total) by mouth daily as needed. With lasix 30 tablet 6  . SPIRIVA HANDIHALER 18 MCG inhalation capsule PLACE 1 CAPSULE (18 MCG TOTAL) INTO INHALER AND INHALE DAILY. 30 capsule 10  . SYMBICORT 160-4.5 MCG/ACT inhaler TAKE 2 PUFFS BY MOUTH TWICE A DAY 10.2 Inhaler 4  . TURMERIC PO Take 1,000 mg by mouth daily.     No current facility-administered medications on file prior to visit.     BP 126/76   Pulse 74   Temp 98 F (36.7 C) (Oral)   Ht 5\' 4"  (1.626 m)   Wt 214 lb (97.1 kg)   SpO2 95%   BMI 36.73 kg/m    Objective:   Physical Exam  Constitutional: She appears well-nourished. She does not appear ill.  HENT:  Right Ear: Tympanic membrane and ear canal normal.  Left Ear: Tympanic membrane and ear canal normal.  Nose: No mucosal edema. Right sinus exhibits maxillary sinus tenderness. Right sinus exhibits no frontal sinus tenderness. Left sinus exhibits maxillary sinus tenderness. Left sinus exhibits no frontal sinus tenderness.  Mouth/Throat: Oropharynx is clear and moist.  Neck: Neck supple.  Cardiovascular: Normal rate and regular rhythm.  Respiratory: Effort normal and breath sounds normal. She has no wheezes.  Skin: Skin is warm and dry.           Assessment & Plan:  Viral Sinusitis:  Sinus pressure, facial pain, nasal congestion x 4-5 days.  Exam today consistent for viral sinusitis, doesn't appear sickly and symptoms do not represent COPD exacerbation.  IM Depo Medrol 80 mg provided for headache and sinus pressure. Discussed use of Flonase Neti  Pot Rinses.  Return precautions provided.  Pleas Koch, NP

## 2018-08-25 NOTE — Addendum Note (Signed)
Addended by: Pleas Koch on: 08/25/2018 10:43 AM   Modules accepted: Orders

## 2018-08-25 NOTE — Patient Instructions (Signed)
Your symptoms are representative of a viral illness which will resolve on its own over time. Our goal is to treat your symptoms in order to aid your body in the healing process and to make you more comfortable.   Nasal Congestion/Ear Pressure/Sinus Pressure: Try using Flonase (fluticasone) nasal spray. Instill 1 spray in each nostril twice daily.   Consider using Neti Pot Rinses for sinuses.  Please call me Monday next week if no improvement in your symptoms as discussed.   It was a pleasure to see you today!

## 2018-08-25 NOTE — Addendum Note (Signed)
Addended by: Jacqualin Combes on: 08/25/2018 10:59 AM   Modules accepted: Orders

## 2018-09-14 ENCOUNTER — Other Ambulatory Visit: Payer: Self-pay | Admitting: Primary Care

## 2018-09-14 DIAGNOSIS — B9789 Other viral agents as the cause of diseases classified elsewhere: Secondary | ICD-10-CM

## 2018-09-14 DIAGNOSIS — J329 Chronic sinusitis, unspecified: Principal | ICD-10-CM

## 2018-09-19 ENCOUNTER — Encounter: Payer: Self-pay | Admitting: *Deleted

## 2018-09-24 ENCOUNTER — Encounter: Payer: Self-pay | Admitting: *Deleted

## 2018-10-15 ENCOUNTER — Ambulatory Visit: Payer: Medicare Other

## 2018-10-21 ENCOUNTER — Encounter: Payer: Medicare Other | Admitting: Family Medicine

## 2018-11-07 ENCOUNTER — Other Ambulatory Visit: Payer: Self-pay | Admitting: Family Medicine

## 2018-11-27 ENCOUNTER — Telehealth: Payer: Self-pay | Admitting: *Deleted

## 2018-11-27 DIAGNOSIS — Z122 Encounter for screening for malignant neoplasm of respiratory organs: Secondary | ICD-10-CM

## 2018-11-27 DIAGNOSIS — Z87891 Personal history of nicotine dependence: Secondary | ICD-10-CM

## 2018-11-27 NOTE — Telephone Encounter (Signed)
Patient has been notified that annual lung cancer screening low dose CT scan is due currently or will be in near future. Confirmed that patient is within the age range of 55-77, and asymptomatic, (no signs or symptoms of lung cancer). Patient denies illness that would prevent curative treatment for lung cancer if found. Verified smoking history, (former, quit 05/31/08, 30 pack year). The shared decision making visit was done 10/15/16. Patient is agreeable for CT scan being scheduled.

## 2018-11-27 NOTE — Telephone Encounter (Signed)
Left message for patient to notify them that it is time to schedule annual low dose lung cancer screening CT scan. Instructed patient to call back to verify information prior to the scan being scheduled.  

## 2018-12-03 ENCOUNTER — Other Ambulatory Visit: Payer: Self-pay

## 2018-12-03 ENCOUNTER — Ambulatory Visit
Admission: RE | Admit: 2018-12-03 | Discharge: 2018-12-03 | Disposition: A | Discharge status: Home / Self Care | Payer: Medicare Other | Source: Ambulatory Visit | Attending: Oncology | Admitting: Oncology

## 2018-12-03 DIAGNOSIS — Z87891 Personal history of nicotine dependence: Secondary | ICD-10-CM | POA: Insufficient documentation

## 2018-12-03 DIAGNOSIS — Z122 Encounter for screening for malignant neoplasm of respiratory organs: Secondary | ICD-10-CM | POA: Diagnosis not present

## 2018-12-05 ENCOUNTER — Other Ambulatory Visit: Payer: Self-pay | Admitting: Family Medicine

## 2018-12-07 ENCOUNTER — Other Ambulatory Visit: Payer: Self-pay | Admitting: *Deleted

## 2018-12-07 MED ORDER — POTASSIUM CHLORIDE ER 10 MEQ PO TBCR: 10.0000 meq | EXTENDED_RELEASE_TABLET | Freq: Every day | ORAL | 2 refills | Status: DC | PRN

## 2018-12-07 MED ORDER — FUROSEMIDE 20 MG PO TABS: 20.0000 mg | ORAL_TABLET | Freq: Every day | ORAL | 2 refills | Status: DC | PRN

## 2018-12-07 NOTE — Telephone Encounter (Signed)
Patient called stating that she needs refills for furosemide and Klor-con sent to the pharmacy because they say they do not have a profile on them for her. Pharmacy [facility]

## 2018-12-09 ENCOUNTER — Encounter: Payer: Self-pay | Admitting: *Deleted

## 2019-01-12 DIAGNOSIS — M26609 Unspecified temporomandibular joint disorder, unspecified side: Secondary | ICD-10-CM | POA: Diagnosis not present

## 2019-01-12 DIAGNOSIS — J301 Allergic rhinitis due to pollen: Secondary | ICD-10-CM | POA: Diagnosis not present

## 2019-01-12 DIAGNOSIS — H9202 Otalgia, left ear: Secondary | ICD-10-CM | POA: Diagnosis not present

## 2019-01-12 DIAGNOSIS — H903 Sensorineural hearing loss, bilateral: Secondary | ICD-10-CM | POA: Diagnosis not present

## 2019-01-16 ENCOUNTER — Encounter: Payer: Self-pay | Admitting: Family Medicine

## 2019-01-16 DIAGNOSIS — R918 Other nonspecific abnormal finding of lung field: Secondary | ICD-10-CM | POA: Insufficient documentation

## 2019-01-22 ENCOUNTER — Encounter: Payer: Self-pay | Admitting: General Surgery

## 2019-03-01 DIAGNOSIS — I10 Essential (primary) hypertension: Secondary | ICD-10-CM | POA: Diagnosis not present

## 2019-03-01 DIAGNOSIS — E7849 Other hyperlipidemia: Secondary | ICD-10-CM | POA: Diagnosis not present

## 2019-03-01 DIAGNOSIS — J432 Centrilobular emphysema: Secondary | ICD-10-CM | POA: Diagnosis not present

## 2019-03-17 ENCOUNTER — Other Ambulatory Visit: Payer: Self-pay | Admitting: Family Medicine

## 2019-03-17 ENCOUNTER — Other Ambulatory Visit: Payer: Medicare Other

## 2019-03-17 DIAGNOSIS — E785 Hyperlipidemia, unspecified: Secondary | ICD-10-CM

## 2019-03-17 DIAGNOSIS — I10 Essential (primary) hypertension: Secondary | ICD-10-CM

## 2019-03-18 ENCOUNTER — Ambulatory Visit (INDEPENDENT_AMBULATORY_CARE_PROVIDER_SITE_OTHER): Payer: Medicare Other

## 2019-03-18 ENCOUNTER — Ambulatory Visit: Payer: Medicare Other

## 2019-03-18 ENCOUNTER — Other Ambulatory Visit (INDEPENDENT_AMBULATORY_CARE_PROVIDER_SITE_OTHER): Payer: Medicare Other

## 2019-03-18 DIAGNOSIS — E785 Hyperlipidemia, unspecified: Secondary | ICD-10-CM

## 2019-03-18 DIAGNOSIS — Z0000 Encounter for general adult medical examination without abnormal findings: Secondary | ICD-10-CM | POA: Diagnosis not present

## 2019-03-18 LAB — COMPREHENSIVE METABOLIC PANEL
ALT: 15 U/L (ref 0–35)
AST: 16 U/L (ref 0–37)
Albumin: 4 g/dL (ref 3.5–5.2)
Alkaline Phosphatase: 62 U/L (ref 39–117)
BUN: 15 mg/dL (ref 6–23)
CO2: 30 mEq/L (ref 19–32)
Calcium: 9.6 mg/dL (ref 8.4–10.5)
Chloride: 107 mEq/L (ref 96–112)
Creatinine, Ser: 0.9 mg/dL (ref 0.40–1.20)
GFR: 61.04 mL/min (ref >60.00)
Glucose, Bld: 111 mg/dL — ABNORMAL HIGH (ref 70–99)
Potassium: 3.9 mEq/L (ref 3.5–5.1)
Sodium: 142 mEq/L (ref 135–145)
Total Bilirubin: 0.6 mg/dL (ref 0.2–1.2)
Total Protein: 7.1 g/dL (ref 6.0–8.3)

## 2019-03-18 LAB — LIPID PANEL
Cholesterol: 182 mg/dL (ref 0–200)
HDL: 59.3 mg/dL (ref >39.00)
LDL Cholesterol: 104 mg/dL — ABNORMAL HIGH (ref 0–99)
NonHDL: 122.48
Total CHOL/HDL Ratio: 3
Triglycerides: 92 mg/dL (ref 0.0–149.0)
VLDL: 18.4 mg/dL (ref 0.0–40.0)

## 2019-03-18 NOTE — Progress Notes (Signed)
Subjective:   JOYCIE ZUNKER is a 73 y.o. female who presents for Medicare Annual (Subsequent) preventive examination.  Review of Systems:    This visit is being conducted through telemedicine due to the COVID-19 pandemic. This patient has given me verbal consent via doximity to conduct this visit, patient states they are participating from their home address. Some vital signs may be absent or patient reported.    Patient identification: identified by name, DOB, and current address  Cardiac Risk Factors include: advanced age (>40men, >9 women);hypertension;dyslipidemia;sedentary lifestyle     Objective:     Vitals: There were no vitals taken for this visit.  There is no height or weight on file to calculate BMI.  Advanced Directives 03/18/2019 10/09/2017 10/03/2016  Does Patient Have a Medical Advance Directive? No Yes Yes  Type of Advance Directive - Healthcare Power of [place] in Chart? - No - copy requested No - copy requested  Would patient like information on creating a medical advance directive? No - Patient declined - -    Tobacco Social History   Tobacco Use  Smoking Status Former Smoker  . Packs/day: 1.00  . Years: 30.00  . Pack years: 30.00  . Types: Cigarettes  . Quit date: 05/31/2008  . Years since quitting: 10.8  Smokeless Tobacco Never Used     Counseling given: Not Answered   Clinical Intake:  Pre-visit preparation completed: Yes  Pain : 0-10 Pain Score: 4  Pain Type: Chronic pain Pain Location: Knee Pain Orientation: Left, Right Pain Descriptors / Indicators: Aching Pain Onset: More than a month ago Pain Frequency: Intermittent     Nutritional Risks: Nausea/ vomitting/ diarrhea(sinus issues, ibs issues) Diabetes: No  How often do you need to have someone help you when you read instructions, pamphlets, or other written materials from your doctor or pharmacy?: 1 - Never What is  the last grade level you completed in school?: business college  Interpreter Needed?: No  Information entered by :: CJohnson, LPN  Past Medical History:  Diagnosis Date  . Adrenal adenoma, right 10/18/2016   Incidental by CT 09/2016  . Aortic valve sclerosis 2014   withoout stenosis, mild aortic insufficiency - Dr Ubaldo Glassing  . Asthma   . CAD (coronary artery disease) 10/18/2016   By CT 09/2016  . Centrilobular emphysema ([place]) 10/18/2016   By CT 09/2016  . Chronic diarrhea 2011   worse in am, ?IBS  . COPD (chronic obstructive pulmonary disease) ([place]) 2005   chronic bronchitis - spirometry FVC 70%, FEV1 72%, abnormal loop volume (unknown date)  . Essential hypertension   . Ex-smoker   . GERD (gastroesophageal reflux disease)   . History of chicken pox   . History of measles   . Hyperlipidemia   . IBS (irritable bowel syndrome)   . Lumbago   . Lump or mass in breast 2012   right breast US guided finesse 1 o'clock  . Obesity   . Osteoarthritis    knees s/p surgeries  . Osteopenia 09/2014   mild by DEXA (-1.2 femur neck)  . Perennial allergic rhinitis    Past Surgical History:  Procedure Laterality Date  . BREAST BIOPSY Right 2012  . BREAST LUMPECTOMY Left 1995   benign mass  . CARDIOVASCULAR STRESS TEST  12/2004   nl myoview, preserved LV fxn with EF 71% without reversible ischemia ([place])  . COLONOSCOPY  2010   hyperplastic polyps identified.  rpt 10 yrs ([place])  . DEXA  09/2014   T -1.2 femur neck  . ESOPHAGOGASTRODUODENOSCOPY  2010   [place]  . KNEE SURGERY Right 2010   arthroscopic  . KNEE SURGERY Left 2014   Family History  Problem Relation Age of Onset  . Diverticulitis Mother   . Heart failure Mother   . Cancer Maternal Aunt        bone, and unsure  . CAD Father 70       MI x3  . Sudden death Brother 32   Social History   Socioeconomic History  . Marital status: Divorced    Spouse name: Not on file  . Number of children: Not on file  . Years of education:  Not on file  . Highest education level: Not on file  Occupational History  . Not on file  Social Needs  . Financial resource strain: Not hard at all  . Food insecurity    Worry: Never true    Inability: Never true  . Transportation needs    Medical: No    Non-medical: No  Tobacco Use  . Smoking status: Former Smoker    Packs/day: 1.00    Years: 30.00    Pack years: 30.00    Types: Cigarettes    Quit date: 05/31/2008    Years since quitting: 10.8  . Smokeless tobacco: Never Used  Substance and Sexual Activity  . Alcohol use: Yes    Alcohol/week: 0.0 standard drinks    Comment: ocassionally wine  . Drug use: No  . Sexual activity: Not Currently  Lifestyle  . Physical activity    Days per week: 0 days    Minutes per session: 0 min  . Stress: To some extent  Relationships  . Social Herbalist on phone: Not on file    Gets together: Not on file    Attends religious service: Not on file    Active member of club or organization: Not on file    Attends meetings of clubs or organizations: Not on file    Relationship status: Not on file  Other Topics Concern  . Not on file  Social History Narrative   Lives alone, 2 feral cats   Occupation: retired, worked for [employer]   Edu: [employer]   Activity: works in yard, unable to walk 2/2 knee pain   Diet: good water, fruits/vegetables daily    Outpatient Encounter Medications as of 03/18/2019  Medication Sig  . albuterol (PROVENTIL HFA;VENTOLIN HFA) 108 (90 Base) MCG/ACT inhaler Inhale 1-2 puffs into the lungs every 6 (six) hours as needed for wheezing or shortness of breath.  Marland Kitchen amLODipine (NORVASC) 5 MG tablet TAKE 1 TABLET BY MOUTH EVERY DAY  . aspirin-acetaminophen-caffeine (EXCEDRIN MIGRAINE) 250-250-65 MG tablet Take 1 tablet by mouth every 6 (six) hours as needed for headache.  Marland Kitchen azelastine (ASTELIN) 0.1 % nasal spray Place 1 spray into both nostrils 2 (two) times daily. Use in each nostril as directed  . b  complex vitamins tablet Take 1 tablet by mouth as needed.  . benzonatate (TESSALON) 100 MG capsule Take 1 capsule (100 mg total) by mouth 3 (three) times daily as needed for cough.  . CETIRIZINE HCL PO Take 1 tablet by mouth as needed.  . Cholecalciferol (VITAMIN D) 2000 units CAPS Take 1 capsule by mouth daily.  Marland Kitchen ezetimibe (ZETIA) 10 MG tablet Take 1 tablet (10 mg total) by mouth daily.  . famotidine (PEPCID) 20 MG  tablet Take 20 mg by mouth as needed.   . fluticasone (FLONASE) 50 MCG/ACT nasal spray Place 1 spray into both nostrils 2 (two) times daily.  . furosemide (LASIX) 20 MG tablet Take 1 tablet (20 mg total) by mouth daily as needed for fluid.  Marland Kitchen HYDROcodone-acetaminophen (NORCO/VICODIN) 5-325 MG tablet Take 1 tablet by mouth every 6 (six) hours as needed for moderate pain.  . Loperamide HCl (ANTI-DIARRHEAL PO) Take by mouth.  . mometasone (ELOCON) 0.1 % cream Apply 1 application topically daily. (Patient taking differently: Apply 1 application topically as needed. )  . montelukast (SINGULAIR) 10 MG tablet TAKE 1 TABLET BY MOUTH EVERY DAY IN THE EVENING  . Multiple Vitamin (MULTIVITAMIN) tablet Take 1 tablet by mouth daily.  . potassium chloride (K-DUR) 10 MEQ tablet Take 1 tablet (10 mEq total) by mouth daily as needed. With lasix  . SPIRIVA HANDIHALER 18 MCG inhalation capsule PLACE 1 CAPSULE (18 MCG TOTAL) INTO INHALER AND INHALE DAILY.  . SYMBICORT 160-4.5 MCG/ACT inhaler TAKE 2 PUFFS BY MOUTH TWICE A DAY  . telmisartan (MICARDIS) 20 MG tablet Take 20 mg by mouth daily.  . TURMERIC PO Take 1,000 mg by mouth daily.  Marland Kitchen doxycycline (VIBRA-TABS) 100 MG tablet Take 1 tablet (100 mg total) by mouth 2 (two) times daily. (Patient not taking: Reported on 03/18/2019)   No facility-administered encounter medications on file as of 03/18/2019.     Activities of Daily Living In your present state of health, do you have any difficulty performing the following activities: 03/18/2019  Hearing? N   Vision? N  Difficulty concentrating or making decisions? N  Walking or climbing stairs? Y  Comment Patient has knee pain  Dressing or bathing? N  Doing errands, shopping? N  Preparing Food and eating ? N  Using the Toilet? N  In the past six months, have you accidently leaked urine? N  Do you have problems with loss of bowel control? N  Managing your Medications? N  Managing your Finances? N  Housekeeping or managing your Housekeeping? N  Some recent data might be hidden    Patient Care Team: Ria Bush, MD as PCP - General (Family Medicine) Bary Castilla, Forest Gleason, MD (General Surgery) Lorelee Market, MD (Family Medicine)    Assessment:   This is a routine wellness examination for Rishika.  Exercise Activities and Dietary recommendations Current Exercise Habits: The patient does not participate in regular exercise at present, Exercise limited by: orthopedic condition(s)  Goals    . DIET - INCREASE WATER INTAKE     Starting 10/09/2017, I will continue to drink 5-6 glasses of water daily.     . Patient Stated     03/18/19, Patient states that she would like to be able to walk better.     . safety     Starting 10/03/16, I will continue to use cane as needed to reduce risk of falls.        Fall Risk Fall Risk  03/18/2019 06/16/2018 10/09/2017 10/03/2016 09/26/2015  Falls in the past year? 0 0 Yes Yes No  Comment - - fell off porch into flower bed; bruising only with no medical treatment pt fell on 06/27/2016 while at friend's house -  Number falls in past yr: 0 - 1 1 -  Injury with Fall? - - Yes Yes -  Risk for fall due to : Medication side effect;Impaired balance/gait - - - -  Follow up Falls evaluation completed;Falls prevention discussed - - - -  Is the patient's home free of loose throw rugs in walkways, pet beds, electrical cords, etc?   yes      Grab bars in the bathroom? no      Handrails on the stairs?   yes      Adequate lighting?   yes  Timed Get Up and Go  performed: n/a  Depression Screen PHQ 2/9 Scores 03/18/2019 10/09/2017 10/03/2016 09/26/2015  PHQ - 2 Score 0 2 1 0  PHQ- 9 Score 0 7 - -     Cognitive Function MMSE - Mini Mental State Exam 03/18/2019 10/09/2017 10/03/2016  Orientation to time 5 5 5   Orientation to Place 5 5 5   Registration 3 3 3   Attention/ Calculation 5 0 0  Recall 3 3 3   Language- name 2 objects - 0 0  Language- repeat 1 1 1   Language- follow 3 step command - 3 3  Language- read & follow direction - 0 0  Write a sentence - 0 0  Copy design - 0 0  Total score - 20 20     Mini Cog  Mini-Cog screen was completed. Maximum score is 22. A value of 0 denotes this part of the MMSE was not completed or the patient failed this part of the Mini-Cog screening.    Immunization History  Administered Date(s) Administered  . Influenza, High Dose Seasonal PF 06/12/2017, 04/18/2018  . Influenza-Unspecified 03/31/2014, 03/02/2015, 05/22/2016  . Pneumococcal Conjugate-13 10/03/2015  . Pneumococcal Polysaccharide-23 10/03/2016  . Pneumococcal-Unspecified 07/02/1999  . Zoster 07/04/2014    Qualifies for Shingles Vaccine?yes  Screening Tests Health Maintenance  Topic Date Due  . COLONOSCOPY  07/01/2018  . INFLUENZA VACCINE  01/30/2019  . TETANUS/TDAP  03/17/2020 (Originally 03/10/1965)  . DTaP/Tdap/Td (1 - Tdap) 03/17/2029 (Originally 03/10/1965)  . MAMMOGRAM  06/09/2019  . DEXA SCAN  Completed  . Hepatitis C Screening  Completed  . PNA vac Low Risk Adult  Completed    Cancer Screenings: Lung: Low Dose CT Chest recommended if Age 72-80 years, 30 pack-year currently smoking OR have quit w/in 15years. Patient does qualify. Completed 12/04/2018 Breast:  Up to date on Mammogram? Yes, completed 06/08/18  Up to date of Bone Density/Dexa? Yes, completed 10/18/14 Colorectal: Will discuss with provider about setting up at next office visit   Additional Screenings: : Hepatitis C Screening: 09/18/15     Plan:    Patient wants to be  able to walk better in the future.   I have personally reviewed and noted the following in the patient's chart:   . Medical and social history . Use of alcohol, tobacco or illicit drugs  . Current medications and supplements . Functional ability and status . Nutritional status . Physical activity . Advanced directives . List of other physicians . Hospitalizations, surgeries, and ER visits in previous 12 months . Vitals . Screenings to include cognitive, depression, and falls . Referrals and appointments  In addition, I have reviewed and discussed with patient certain preventive protocols, quality metrics, and best practice recommendations. A written personalized care plan for preventive services as well as general preventive health recommendations were provided to patient.     Andrez Grime, LPN  [id number]

## 2019-03-18 NOTE — Progress Notes (Signed)
PCP notes: none  Health Maintenance: Patient wants flu vaccine at upcoming physical and wants to have colonoscopy set up. Declined Tdap and Shingrix vaccines.     Abnormal Screenings: none    Patient concerns: none    Nurse concerns: none    Next PCP appt.: 03/22/2019 @ 2:15 pm

## 2019-03-18 NOTE — Patient Instructions (Signed)
Monica Wise , Thank you for taking time to come for your Medicare Wellness Visit. I appreciate your ongoing commitment to your health goals. Please review the following plan we discussed and let me know if I can assist you in the future.   Screening recommendations/referrals: Colonoscopy: Patient will discuss setting this up at her next office visit with provider Mammogram: up to date, completed 06/08/18 Bone Density: up to date, completed 10/18/14 Recommended yearly ophthalmology/optometry visit for glaucoma screening and checkup Recommended yearly dental visit for hygiene and checkup  Vaccinations: Influenza vaccine: will receive at next office visit Pneumococcal vaccine: series completed Tdap vaccine: declined Shingles vaccine: declined    Advanced directives: Advance directive discussed with you today. Even though you declined this today please call our office should you change your mind and we can give you the proper paperwork for you to fill out.   Conditions/risks identified: hypertension, hyperlipidemia  Next appointment: 03/22/2019@ 2:15 pm   Preventive Care 46 Years and Older, Female Preventive care refers to lifestyle choices and visits with your health care provider that can promote health and wellness. What does preventive care include?  A yearly physical exam. This is also called an annual well check.  Dental exams once or twice a year.  Routine eye exams. Ask your health care provider how often you should have your eyes checked.  Personal lifestyle choices, including:  Daily care of your teeth and gums.  Regular physical activity.  Eating a healthy diet.  Avoiding tobacco and drug use.  Limiting alcohol use.  Practicing safe sex.  Taking low-dose aspirin every day.  Taking vitamin and mineral supplements as recommended by your health care provider. What happens during an annual well check? The services and screenings done by your health care provider  during your annual well check will depend on your age, overall health, lifestyle risk factors, and family history of disease. Counseling  Your health care provider may ask you questions about your:  Alcohol use.  Tobacco use.  Drug use.  Emotional well-being.  Home and relationship well-being.  Sexual activity.  Eating habits.  History of falls.  Memory and ability to understand (cognition).  Work and work Statistician.  Reproductive health. Screening  You may have the following tests or measurements:  Height, weight, and BMI.  Blood pressure.  Lipid and cholesterol levels. These may be checked every 5 years, or more frequently if you are over 6 years old.  Skin check.  Lung cancer screening. You may have this screening every year starting at age 37 if you have a 30-pack-year history of smoking and currently smoke or have quit within the past 15 years.  Fecal occult blood test (FOBT) of the stool. You may have this test every year starting at age 41.  Flexible sigmoidoscopy or colonoscopy. You may have a sigmoidoscopy every 5 years or a colonoscopy every 10 years starting at age 66.  Hepatitis C blood test.  Hepatitis B blood test.  Sexually transmitted disease (STD) testing.  Diabetes screening. This is done by checking your blood sugar (glucose) after you have not eaten for a while (fasting). You may have this done every 1-3 years.  Bone density scan. This is done to screen for osteoporosis. You may have this done starting at age 66.  Mammogram. This may be done every 1-2 years. Talk to your health care provider about how often you should have regular mammograms. Talk with your health care provider about your test results, treatment options,  and if necessary, the need for more tests. Vaccines  Your health care provider may recommend certain vaccines, such as:  Influenza vaccine. This is recommended every year.  Tetanus, diphtheria, and acellular pertussis  (Tdap, Td) vaccine. You may need a Td booster every 10 years.  Zoster vaccine. You may need this after age 51.  Pneumococcal 13-valent conjugate (PCV13) vaccine. One dose is recommended after age 3.  Pneumococcal polysaccharide (PPSV23) vaccine. One dose is recommended after age 39. Talk to your health care provider about which screenings and vaccines you need and how often you need them. This information is not intended to replace advice given to you by your health care provider. Make sure you discuss any questions you have with your health care provider. Document Released: 07/14/2015 Document Revised: 03/06/2016 Document Reviewed: 04/18/2015 Elsevier Interactive Patient Education  2017 [place] Prevention in the Home Falls can cause injuries. They can happen to people of all ages. There are many things you can do to make your home safe and to help prevent falls. What can I do on the outside of my home?  Regularly fix the edges of walkways and driveways and fix any cracks.  Remove anything that might make you trip as you walk through a door, such as a raised step or threshold.  Trim any bushes or trees on the path to your home.  Use bright outdoor lighting.  Clear any walking paths of anything that might make someone trip, such as rocks or tools.  Regularly check to see if handrails are loose or broken. Make sure that both sides of any steps have handrails.  Any raised decks and porches should have guardrails on the edges.  Have any leaves, snow, or ice cleared regularly.  Use sand or salt on walking paths during winter.  Clean up any spills in your garage right away. This includes oil or grease spills. What can I do in the bathroom?  Use night lights.  Install grab bars by the toilet and in the tub and shower. Do not use towel bars as grab bars.  Use non-skid mats or decals in the tub or shower.  If you need to sit down in the shower, use a plastic, non-slip  stool.  Keep the floor dry. Clean up any water that spills on the floor as soon as it happens.  Remove soap buildup in the tub or shower regularly.  Attach bath mats securely with double-sided non-slip rug tape.  Do not have throw rugs and other things on the floor that can make you trip. What can I do in the bedroom?  Use night lights.  Make sure that you have a light by your bed that is easy to reach.  Do not use any sheets or blankets that are too big for your bed. They should not hang down onto the floor.  Have a firm chair that has side arms. You can use this for support while you get dressed.  Do not have throw rugs and other things on the floor that can make you trip. What can I do in the kitchen?  Clean up any spills right away.  Avoid walking on wet floors.  Keep items that you use a lot in easy-to-reach places.  If you need to reach something above you, use a strong step stool that has a grab bar.  Keep electrical cords out of the way.  Do not use floor polish or wax that makes floors slippery. If  you must use wax, use non-skid floor wax.  Do not have throw rugs and other things on the floor that can make you trip. What can I do with my stairs?  Do not leave any items on the stairs.  Make sure that there are handrails on both sides of the stairs and use them. Fix handrails that are broken or loose. Make sure that handrails are as long as the stairways.  Check any carpeting to make sure that it is firmly attached to the stairs. Fix any carpet that is loose or worn.  Avoid having throw rugs at the top or bottom of the stairs. If you do have throw rugs, attach them to the floor with carpet tape.  Make sure that you have a light switch at the top of the stairs and the bottom of the stairs. If you do not have them, ask someone to add them for you. What else can I do to help prevent falls?  Wear shoes that:  Do not have high heels.  Have rubber bottoms.  Are  comfortable and fit you well.  Are closed at the toe. Do not wear sandals.  If you use a stepladder:  Make sure that it is fully opened. Do not climb a closed stepladder.  Make sure that both sides of the stepladder are locked into place.  Ask someone to hold it for you, if possible.  Clearly mark and make sure that you can see:  Any grab bars or handrails.  First and last steps.  Where the edge of each step is.  Use tools that help you move around (mobility aids) if they are needed. These include:  Canes.  Walkers.  Scooters.  Crutches.  Turn on the lights when you go into a dark area. Replace any light bulbs as soon as they burn out.  Set up your furniture so you have a clear path. Avoid moving your furniture around.  If any of your floors are uneven, fix them.  If there are any pets around you, be aware of where they are.  Review your medicines with your doctor. Some medicines can make you feel dizzy. This can increase your chance of falling. Ask your doctor what other things that you can do to help prevent falls. This information is not intended to replace advice given to you by your health care provider. Make sure you discuss any questions you have with your health care provider. Document Released: 04/13/2009 Document Revised: 11/23/2015 Document Reviewed: 07/22/2014 Elsevier Interactive Patient Education  2017 [employer].

## 2019-03-18 NOTE — Progress Notes (Signed)
Subjective:   CATI UHLE is a 73 y.o. female who presents for Medicare Annual (Subsequent) preventive examination.  Review of Systems:    This visit is being conducted through telemedicine due to the COVID-19 pandemic. This patient has given me verbal consent via doximity to conduct this visit, patient states they are participating from their home address. Some vital signs may be absent or patient reported.    Patient identification: identified by name, DOB, and current address        Objective:     Vitals: There were no vitals taken for this visit.  There is no height or weight on file to calculate BMI.  Advanced Directives 10/09/2017 10/03/2016  Does Patient Have a Medical Advance Directive? Yes Yes  Type of Advance Directive Healthcare Power of [place] in Chart? No - copy requested No - copy requested    Tobacco Social History   Tobacco Use  Smoking Status Former Smoker  . Packs/day: 1.00  . Years: 30.00  . Pack years: 30.00  . Types: Cigarettes  . Quit date: 05/31/2008  . Years since quitting: 10.8  Smokeless Tobacco Never Used     Counseling given: Not Answered   Clinical Intake:                       Past Medical History:  Diagnosis Date  . Adrenal adenoma, right 10/18/2016   Incidental by CT 09/2016  . Aortic valve sclerosis 2014   withoout stenosis, mild aortic insufficiency - Dr Ubaldo Glassing  . Asthma   . CAD (coronary artery disease) 10/18/2016   By CT 09/2016  . Centrilobular emphysema ([place]) 10/18/2016   By CT 09/2016  . Chronic diarrhea 2011   worse in am, ?IBS  . COPD (chronic obstructive pulmonary disease) ([place]) 2005   chronic bronchitis - spirometry FVC 70%, FEV1 72%, abnormal loop volume (unknown date)  . Essential hypertension   . Ex-smoker   . GERD (gastroesophageal reflux disease)   . History of chicken pox   . History of measles   . Hyperlipidemia   . IBS (irritable  bowel syndrome)   . Lumbago   . Lump or mass in breast 2012   right breast US guided finesse 1 o'clock  . Obesity   . Osteoarthritis    knees s/p surgeries  . Osteopenia 09/2014   mild by DEXA (-1.2 femur neck)  . Perennial allergic rhinitis    Past Surgical History:  Procedure Laterality Date  . BREAST BIOPSY Right 2012  . BREAST LUMPECTOMY Left 1995   benign mass  . CARDIOVASCULAR STRESS TEST  12/2004   nl myoview, preserved LV fxn with EF 71% without reversible ischemia ([facility])  . COLONOSCOPY  2010   hyperplastic polyps identified. rpt 10 yrs ([facility])  . DEXA  09/2014   T -1.2 femur neck  . ESOPHAGOGASTRODUODENOSCOPY  2010   [facility]  . KNEE SURGERY Right 2010   arthroscopic  . KNEE SURGERY Left 2014   Family History  Problem Relation Age of Onset  . Diverticulitis Mother   . Heart failure Mother   . Cancer Maternal Aunt        bone, and unsure  . CAD Father 36       MI x3  . Sudden death Brother 36   Social History   Socioeconomic History  . Marital status: Divorced    Spouse name: Not on  file  . Number of children: Not on file  . Years of education: Not on file  . Highest education level: Not on file  Occupational History  . Not on file  Social Needs  . Financial resource strain: Not on file  . Food insecurity    Worry: Not on file    Inability: Not on file  . Transportation needs    Medical: Not on file    Non-medical: Not on file  Tobacco Use  . Smoking status: Former Smoker    Packs/day: 1.00    Years: 30.00    Pack years: 30.00    Types: Cigarettes    Quit date: 05/31/2008    Years since quitting: 10.8  . Smokeless tobacco: Never Used  Substance and Sexual Activity  . Alcohol use: Yes    Alcohol/week: 0.0 standard drinks    Comment: ocassionally wine  . Drug use: No  . Sexual activity: Not Currently  Lifestyle  . Physical activity    Days per week: Not on file    Minutes per session: Not on file  . Stress: Not on file  Relationships  .  Social Herbalist on phone: Not on file    Gets together: Not on file    Attends religious service: Not on file    Active member of club or organization: Not on file    Attends meetings of clubs or organizations: Not on file    Relationship status: Not on file  Other Topics Concern  . Not on file  Social History Narrative   Lives alone, 2 feral cats   Occupation: retired, worked for [employer]   Edu: [employer]   Activity: works in yard, unable to walk 2/2 knee pain   Diet: good water, fruits/vegetables daily    Outpatient Encounter Medications as of 03/18/2019  Medication Sig  . albuterol (PROVENTIL HFA;VENTOLIN HFA) 108 (90 Base) MCG/ACT inhaler Inhale 1-2 puffs into the lungs every 6 (six) hours as needed for wheezing or shortness of breath.  Marland Kitchen amLODipine (NORVASC) 5 MG tablet TAKE 1 TABLET BY MOUTH EVERY DAY  . b complex vitamins tablet Take 1 tablet by mouth as needed.  . benzonatate (TESSALON) 100 MG capsule Take 1 capsule (100 mg total) by mouth 3 (three) times daily as needed for cough.  . CETIRIZINE HCL PO Take 1 tablet by mouth as needed.  . Cholecalciferol (VITAMIN D) 2000 units CAPS Take 1 capsule by mouth daily.  Marland Kitchen doxycycline (VIBRA-TABS) 100 MG tablet Take 1 tablet (100 mg total) by mouth 2 (two) times daily.  Marland Kitchen ezetimibe (ZETIA) 10 MG tablet Take 1 tablet (10 mg total) by mouth daily.  . famotidine (PEPCID) 20 MG tablet Take 20 mg by mouth as needed.   . fluticasone (FLONASE) 50 MCG/ACT nasal spray Place 1 spray into both nostrils 2 (two) times daily.  . furosemide (LASIX) 20 MG tablet Take 1 tablet (20 mg total) by mouth daily as needed for fluid.  Marland Kitchen HYDROcodone-acetaminophen (NORCO/VICODIN) 5-325 MG tablet Take 1 tablet by mouth every 6 (six) hours as needed for moderate pain.  . Loperamide HCl (ANTI-DIARRHEAL PO) Take by mouth.  . mometasone (ELOCON) 0.1 % cream Apply 1 application topically daily. (Patient taking differently: Apply 1 application topically  as needed. )  . montelukast (SINGULAIR) 10 MG tablet TAKE 1 TABLET BY MOUTH EVERY DAY IN THE EVENING  . Multiple Vitamin (MULTIVITAMIN) tablet Take 1 tablet by mouth daily.  . potassium chloride (  K-DUR) 10 MEQ tablet Take 1 tablet (10 mEq total) by mouth daily as needed. With lasix  . SPIRIVA HANDIHALER 18 MCG inhalation capsule PLACE 1 CAPSULE (18 MCG TOTAL) INTO INHALER AND INHALE DAILY.  . SYMBICORT 160-4.5 MCG/ACT inhaler TAKE 2 PUFFS BY MOUTH TWICE A DAY  . TURMERIC PO Take 1,000 mg by mouth daily.   No facility-administered encounter medications on file as of 03/18/2019.     Activities of Daily Living No flowsheet data found.  Patient Care Team: Ria Bush, MD as PCP - General (Family Medicine) Bary Castilla, Forest Gleason, MD (General Surgery) Lorelee Market, MD (Family Medicine)    Assessment:   This is a routine wellness examination for Marielis.  Exercise Activities and Dietary recommendations    Goals    . DIET - INCREASE WATER INTAKE     Starting 10/09/2017, I will continue to drink 5-6 glasses of water daily.     . safety     Starting 10/03/16, I will continue to use cane as needed to reduce risk of falls.        Fall Risk Fall Risk  06/16/2018 10/09/2017 10/03/2016 09/26/2015 09/19/2014  Falls in the past year? 0 Yes Yes No Yes  Comment - fell off porch into flower bed; bruising only with no medical treatment pt fell on 06/27/2016 while at friend's house - slipped on rock outside  Number falls in past yr: - 1 1 - 1  Injury with Fall? - Yes Yes - No   Is the patient's home free of loose throw rugs in walkways, pet beds, electrical cords, etc?   yes      Grab bars in the bathroom? no      Handrails on the stairs?   yes      Adequate lighting?   yes  Timed Get Up and Go performed: n/a  Depression Screen PHQ 2/9 Scores 10/09/2017 10/03/2016 09/26/2015 09/19/2014  PHQ - 2 Score 2 1 0 0  PHQ- 9 Score 7 - - -     Cognitive Function MMSE - Mini Mental State Exam 10/09/2017  10/03/2016  Orientation to time 5 5  Orientation to Place 5 5  Registration 3 3  Attention/ Calculation 0 0  Recall 3 3  Language- name 2 objects 0 0  Language- repeat 1 1  Language- follow 3 step command 3 3  Language- read & follow direction 0 0  Write a sentence 0 0  Copy design 0 0  Total score 20 20        Immunization History  Administered Date(s) Administered  . Influenza, High Dose Seasonal PF 06/12/2017, 04/18/2018  . Influenza-Unspecified 03/31/2014, 03/02/2015, 05/22/2016  . Pneumococcal Conjugate-13 10/03/2015  . Pneumococcal Polysaccharide-23 10/03/2016  . Pneumococcal-Unspecified 07/02/1999  . Zoster 07/04/2014    Qualifies for Shingles Vaccine?yes  Screening Tests Health Maintenance  Topic Date Due  . DTaP/Tdap/Td (1 - Tdap) 03[birth year]10/1965  . TETANUS/TDAP  03/10/1963  . COLONOSCOPY  07/01/2018  . INFLUENZA VACCINE  01/30/2019  . MAMMOGRAM  06/09/2019  . DEXA SCAN  Completed  . Hepatitis C Screening  Completed  . PNA vac Low Risk Adult  Completed    Cancer Screenings: Lung: Low Dose CT Chest recommended if Age 40-80 years, 30 pack-year currently smoking OR have quit w/in 15years. Patient does qualify. Completed 12/04/2018 Breast:  Up to date on Mammogram? Yes, completed 06/08/18  Up to date of Bone Density/Dexa? Yes, completed 10/18/14 Colorectal: due, will talk with provider about setting this  up   Additional Screenings: : Hepatitis C Screening: completed 09/18/15     Plan:    Patient wants to be able to walk better one day.   I have personally reviewed and noted the following in the patient's chart:   . Medical and social history . Use of alcohol, tobacco or illicit drugs  . Current medications and supplements . Functional ability and status . Nutritional status . Physical activity . Advanced directives . List of other physicians . Hospitalizations, surgeries, and ER visits in previous 12 months . Vitals . Screenings to include cognitive,  depression, and falls . Referrals and appointments  In addition, I have reviewed and discussed with patient certain preventive protocols, quality metrics, and best practice recommendations. A written personalized care plan for preventive services as well as general preventive health recommendations were provided to patient.     Andrez Grime, LPN  [id number]

## 2019-03-22 ENCOUNTER — Encounter: Payer: Self-pay | Admitting: Family Medicine

## 2019-03-22 ENCOUNTER — Ambulatory Visit (INDEPENDENT_AMBULATORY_CARE_PROVIDER_SITE_OTHER): Payer: Medicare Other | Admitting: Family Medicine

## 2019-03-22 DIAGNOSIS — I10 Essential (primary) hypertension: Secondary | ICD-10-CM | POA: Diagnosis not present

## 2019-03-22 DIAGNOSIS — J432 Centrilobular emphysema: Secondary | ICD-10-CM

## 2019-03-22 DIAGNOSIS — E785 Hyperlipidemia, unspecified: Secondary | ICD-10-CM | POA: Diagnosis not present

## 2019-03-22 DIAGNOSIS — I2510 Atherosclerotic heart disease of native coronary artery without angina pectoris: Secondary | ICD-10-CM

## 2019-03-22 MED ORDER — SPIRIVA HANDIHALER 18 MCG IN CAPS: 18.0000 ug | ORAL_CAPSULE | Freq: Every day | RESPIRATORY_TRACT | 11 refills | Status: DC

## 2019-03-22 MED ORDER — ZZZQUIL 25 MG PO CAPS: 1.0000 | ORAL_CAPSULE | Freq: Every evening | ORAL | Status: DC | PRN

## 2019-03-22 NOTE — Assessment & Plan Note (Signed)
Deteriorated. Cards recently changed amlodipine to ARB - advised start monitoring blood pressures regularly and to call me if staying elevated to increase telmisartan dosing.Marland Kitchen RTC 3 mo HTN f/u visit

## 2019-03-22 NOTE — Assessment & Plan Note (Signed)
Regularly sees cardiology 

## 2019-03-22 NOTE — Progress Notes (Signed)
[name] - 73 y.o. female  MRN [id number]  Date of Birth: [birth year]  PCP: Ria Bush, MD  This service was provided via telemedicine. Phone Visit performed on 03/22/2019    Rationale for phone visit along with limitations reviewed. Patient consented to telephone encounter.    Location of patient: at home  Location of provider: in office, [place] @ [facility]  Name of referring provider: N/A    Names of persons and role in encounter: Provider: Ria Bush, MD  Patient: Monica Wise  Other: N/A   Time on call: 2:24pm - 2:52pm   Subjective: Chief Complaint  Patient presents with  . Annual Exam    Prt 2.    . Discuss Medication    Wants to discuss furosemide.  Told by pharmacist it has sulfa in it, which she is allergic to.  Also, wants to discuss Zzquil.   . Medication Refill     HPI:  Saw health advisor last week for medicare wellness visit. Note reviewed.    Son and DIL tested positive for Covid19 so pt's visit was changed to phone visit (she does not have video access), however she has not been in close contact with them and remains asymptomatic.  IBS stable.  Echo 2014 - mild AI, aortic sclerosis not stenosis.   HTN - cards changed amlodipine to telmisartan. BP remaining elevated - improved on recheck.   Preventative: G1P1  COLONOSCOPY Date: 2010 hyperplastic polyps identified. Rpt 10 yrs Careers information officer) - she will consider options and let us know.  Mammogram12/2019 birads2  Well woman exam - remotely. Discussed, declines further eval. Lung cancer screening - undergoing screening, reviewed CT 11/2018 - emphysema, aortic ATH, calcified CA  DEXA Date: 09/2014 T -1.2 femur neck  Flu shot - yearly  Pneumovax 2001, prevnar4/2017, pneumovax 09/2016  zostavax 07/2014  shingrix - discussed, will consider new shingles shot - to check at pharmacy  Advanced directive:Again reviewed with patient - son is POA. Has also discussed with local cousin. Has declined  bringing copy in the past.  Seat belt use discussed  Sunscreen use discussed. No changing moles on skin Ex smoker - quit 2009. 30 PY hx.  Alcohol - 1-2 glass of wine a week Bowel - no constipation  Bladder - no incontinence  Lives alone, 2 feral cats Occupation: retired, worked for [employer] Edu: [employer] Activity: works in yard, unable to walk 2/2 knee pain  Diet: good water, fruits/vegetables daily    Objective/Observations:  No physical exam or vital signs collected unless specifically identified below.   BP (!) 170/80   Pulse 70   Ht 5\' 4"  (1.626 m)   BMI 36.73 kg/m   Elevated on repeat  Respiratory status: speaks in complete sentences without evident shortness of breath.   Results for orders placed or performed in visit on 03/18/19  Comprehensive metabolic panel  Result Value Ref Range   Sodium 142 135 - 145 mEq/L   Potassium 3.9 3.5 - 5.1 mEq/L   Chloride 107 96 - 112 mEq/L   CO2 30 19 - 32 mEq/L   Glucose, Bld 111 (H) 70 - 99 mg/dL   BUN 15 6 - 23 mg/dL   Creatinine, Ser 0.90 0.40 - 1.20 mg/dL   Total Bilirubin 0.6 0.2 - 1.2 mg/dL   Alkaline Phosphatase 62 39 - 117 U/L   AST 16 0 - 37 U/L   ALT 15 0 - 35 U/L   Total Protein 7.1 6.0 - 8.3  g/dL   Albumin 4.0 3.5 - 5.2 g/dL   Calcium 9.6 8.4 - 10.5 mg/dL   GFR 61.04 >60.00 mL/min  Lipid panel  Result Value Ref Range   Cholesterol 182 0 - 200 mg/dL   Triglycerides 92.0 0.0 - 149.0 mg/dL   HDL 59.30 >39.00 mg/dL   VLDL 18.4 0.0 - 40.0 mg/dL   LDL Cholesterol 104 (H) 0 - 99 mg/dL   Total CHOL/HDL Ratio 3    NonHDL 122.48      Assessment/Plan:  Centrilobular emphysema (HCC) Continue symbicort, spiriva, PRN albuterol.   Coronary artery calcification seen on CAT scan Regularly sees cardiology.   Essential hypertension Deteriorated. Cards recently changed amlodipine to ARB - advised start monitoring blood pressures regularly and to call me if staying elevated to increase telmisartan dosing.Marland Kitchen RTC 3 mo  HTN f/u visit   Hyperlipidemia Chronic, stable off krill oil only on zetia. LDL goal <100.  The 10-year ASCVD risk score Mikey Bussing DC Brooke Bonito., et al., 2013) is: 34%   Values used to calculate the score:     Age: 33 years     Sex: Female     Is Non-Hispanic African American: No     Diabetic: No     Tobacco smoker: No     Systolic Blood Pressure: 123XX123 mmHg     Is BP treated: Yes     HDL Cholesterol: 59.3 mg/dL     Total Cholesterol: 182 mg/dL    I discussed the assessment and treatment plan with the patient. The patient was provided an opportunity to ask questions and all were answered. The patient agreed with the plan and demonstrated an understanding of the instructions.  Lab Orders  No laboratory test(s) ordered today    Meds ordered this encounter  Medications  . tiotropium (SPIRIVA HANDIHALER) 18 MCG inhalation capsule    Sig: Place 1 capsule (18 mcg total) into inhaler and inhale daily.    Dispense:  30 capsule    Refill:  11  . diphenhydrAMINE HCl, Sleep, (ZZZQUIL) 25 MG CAPS    Sig: Take 1 capsule by mouth at bedtime as needed (insomnia).    The patient was advised to call back or seek an in-person evaluation if the symptoms worsen or if the condition fails to improve as anticipated.  Ria Bush, MD

## 2019-03-22 NOTE — Assessment & Plan Note (Signed)
Continue symbicort, spiriva, PRN albuterol.

## 2019-03-22 NOTE — Assessment & Plan Note (Signed)
Chronic, stable off krill oil only on zetia. LDL goal <100.  The 10-year ASCVD risk score Mikey Bussing DC Brooke Bonito., et al., 2013) is: 34%   Values used to calculate the score:     Age: 73 years     Sex: Female     Is Non-Hispanic African American: No     Diabetic: No     Tobacco smoker: No     Systolic Blood Pressure: 123XX123 mmHg     Is BP treated: Yes     HDL Cholesterol: 59.3 mg/dL     Total Cholesterol: 182 mg/dL

## 2019-03-23 ENCOUNTER — Other Ambulatory Visit: Payer: Self-pay

## 2019-03-23 DIAGNOSIS — Z20822 Contact with and (suspected) exposure to covid-19: Secondary | ICD-10-CM

## 2019-03-23 DIAGNOSIS — R6889 Other general symptoms and signs: Secondary | ICD-10-CM | POA: Diagnosis not present

## 2019-03-24 LAB — NOVEL CORONAVIRUS, NAA: SARS-CoV-2, NAA: NOT DETECTED

## 2019-04-01 ENCOUNTER — Other Ambulatory Visit: Payer: Self-pay | Admitting: Family Medicine

## 2019-04-06 ENCOUNTER — Other Ambulatory Visit: Payer: Self-pay | Admitting: Family Medicine

## 2019-04-06 ENCOUNTER — Ambulatory Visit (INDEPENDENT_AMBULATORY_CARE_PROVIDER_SITE_OTHER): Payer: Medicare Other

## 2019-04-06 DIAGNOSIS — Z23 Encounter for immunization: Secondary | ICD-10-CM

## 2019-04-24 ENCOUNTER — Other Ambulatory Visit: Payer: Self-pay | Admitting: Family Medicine

## 2019-05-10 ENCOUNTER — Telehealth: Payer: Self-pay

## 2019-05-10 NOTE — Telephone Encounter (Signed)
The patient has transferred her medical care over to Dr Bary Castilla. He will assume her care from now on.

## 2019-06-11 DIAGNOSIS — Z1231 Encounter for screening mammogram for malignant neoplasm of breast: Secondary | ICD-10-CM | POA: Diagnosis not present

## 2019-06-11 DIAGNOSIS — Z9289 Personal history of other medical treatment: Secondary | ICD-10-CM | POA: Diagnosis not present

## 2019-06-11 LAB — HM MAMMOGRAPHY

## 2019-06-22 ENCOUNTER — Ambulatory Visit (INDEPENDENT_AMBULATORY_CARE_PROVIDER_SITE_OTHER): Payer: Medicare Other | Admitting: Family Medicine

## 2019-06-22 ENCOUNTER — Encounter: Payer: Self-pay | Admitting: Family Medicine

## 2019-06-22 ENCOUNTER — Other Ambulatory Visit: Payer: Self-pay

## 2019-06-22 VITALS — BP 140/68 | HR 79 | Temp 97.8°F | Ht 64.0 in | Wt 220.4 lb

## 2019-06-22 DIAGNOSIS — I10 Essential (primary) hypertension: Secondary | ICD-10-CM | POA: Diagnosis not present

## 2019-06-22 DIAGNOSIS — I358 Other nonrheumatic aortic valve disorders: Secondary | ICD-10-CM

## 2019-06-22 MED ORDER — ADVIL PM 200-25 MG PO CAPS: 1.0000 | ORAL_CAPSULE | Freq: Every evening | ORAL | Status: DC | PRN

## 2019-06-22 NOTE — Progress Notes (Signed)
This visit was conducted in person.  BP 140/68 (BP Location: Left Arm, Patient Position: Sitting, Cuff Size: Large)   Pulse 79   Temp 97.8 F (36.6 C) (Temporal)   Ht 5\' 4"  (1.626 m)   Wt 220 lb 6 oz (100 kg)   SpO2 95%   BMI 37.83 kg/m    CC: HTN f/u visit Subjective:    Patient ID: Monica Wise, female    DOB: Dec 23, 1945, 73 y.o.   MRN: [id number]  HPI: Monica Wise is a 73 y.o. female presenting on 06/22/2019 for Hypertension (Here for 1 mo f/u.)   HTN - Compliant with current antihypertensive regimen of telmisartan 20mg  daily and lasix 20mg  daily prn (doesn't frequently use). Does check blood pressures at home: overall good control. Has arm and wrist cuff.  No low blood pressure readings or symptoms of dizziness/syncope.  Denies vision changes, CP/tightness, SOB, leg swelling.  Occasional headache.      Relevant past medical, surgical, family and social history reviewed and updated as indicated. Interim medical history since our last visit reviewed. Allergies and medications reviewed and updated. Outpatient Medications Prior to Visit  Medication Sig Dispense Refill  . albuterol (PROVENTIL HFA;VENTOLIN HFA) 108 (90 Base) MCG/ACT inhaler Inhale 1-2 puffs into the lungs every 6 (six) hours as needed for wheezing or shortness of breath. 18 g 3  . aspirin-acetaminophen-caffeine (EXCEDRIN MIGRAINE) [id number] MG tablet Take 1 tablet by mouth every 6 (six) hours as needed for headache.    Marland Kitchen azelastine (ASTELIN) 0.1 % nasal spray Place 1 spray into both nostrils 2 (two) times daily. Use in each nostril as directed    . b complex vitamins tablet Take 1 tablet by mouth as needed.    . benzonatate (TESSALON) 100 MG capsule Take 1 capsule (100 mg total) by mouth 3 (three) times daily as needed for cough. 30 capsule 0  . CETIRIZINE HCL PO Take 1 tablet by mouth as needed.    . Cholecalciferol (VITAMIN D) 2000 units CAPS Take 1 capsule by mouth daily.    Marland Kitchen ezetimibe (ZETIA) 10 MG tablet TAKE  1 TABLET BY MOUTH EVERY DAY 90 tablet 3  . famotidine (PEPCID) 20 MG tablet Take 20 mg by mouth as needed.     . fluticasone (FLONASE) 50 MCG/ACT nasal spray Place 1 spray into both nostrils 2 (two) times daily. 16 g 0  . furosemide (LASIX) 20 MG tablet TAKE 1 TABLET (20 MG TOTAL) BY MOUTH DAILY AS NEEDED FOR FLUID. 90 tablet 0  . HYDROcodone-acetaminophen (NORCO/VICODIN) 5-325 MG tablet Take 1 tablet by mouth every 6 (six) hours as needed for moderate pain. 20 tablet 0  . Loperamide HCl (ANTI-DIARRHEAL PO) Take by mouth.    . Melatonin 10 MG TABS Take by mouth as needed.    . Melatonin 5 MG TABS Take by mouth as needed.    . mometasone (ELOCON) 0.1 % cream Apply 1 application topically daily. (Patient taking differently: Apply 1 application topically as needed. ) 45 g 0  . montelukast (SINGULAIR) 10 MG tablet TAKE 1 TABLET BY MOUTH EVERY DAY IN THE EVENING 90 tablet 1  . Multiple Vitamin (MULTIVITAMIN) tablet Take 1 tablet by mouth daily.    . potassium chloride (K-DUR) 10 MEQ tablet Take 1 tablet (10 mEq total) by mouth daily as needed. With lasix 30 tablet 2  . SYMBICORT 160-4.5 MCG/ACT inhaler TAKE 2 PUFFS BY MOUTH TWICE A DAY 10.2 Inhaler 5  . telmisartan (MICARDIS) 20  MG tablet Take 20 mg by mouth daily.    Marland Kitchen tiotropium (SPIRIVA HANDIHALER) 18 MCG inhalation capsule Place 1 capsule (18 mcg total) into inhaler and inhale daily. 30 capsule 11  . TURMERIC PO Take 1,000 mg by mouth daily.    . diphenhydrAMINE HCl, Sleep, (ZZZQUIL) 25 MG CAPS Take 1 capsule by mouth at bedtime as needed (insomnia).    . Ibuprofen-diphenhydrAMINE Cit (ADVIL PM PO) Take by mouth as needed.     No facility-administered medications prior to visit.     Per HPI unless specifically indicated in ROS section below Review of Systems Objective:    BP 140/68 (BP Location: Left Arm, Patient Position: Sitting, Cuff Size: Large)   Pulse 79   Temp 97.8 F (36.6 C) (Temporal)   Ht 5\' 4"  (1.626 m)   Wt 220 lb 6 oz (100  kg)   SpO2 95%   BMI 37.83 kg/m   Wt Readings from Last 3 Encounters:  06/22/19 220 lb 6 oz (100 kg)  12/03/18 214 lb (97.1 kg)  08/25/18 214 lb (97.1 kg)    Physical Exam Vitals and nursing note reviewed.  Constitutional:      Appearance: Normal appearance. She is obese. She is not ill-appearing.  Cardiovascular:     Rate and Rhythm: Normal rate and regular rhythm.     Pulses: Normal pulses.     Heart sounds: Murmur (3/6 systolic) present.  Pulmonary:     Effort: Pulmonary effort is normal. No respiratory distress.     Breath sounds: Normal breath sounds. No wheezing, rhonchi or rales.  Musculoskeletal:     Right lower leg: No edema.     Left lower leg: No edema.  Neurological:     Mental Status: She is alert.  Psychiatric:        Mood and Affect: Mood normal.        Behavior: Behavior normal.       Results for orders placed or performed in visit on 03/23/19  Novel Coronavirus, NAA ([facility])   Specimen: Oropharyngeal(OP) collection in vial transport medium   OROPHARYNGEA  TESTING  Result Value Ref Range   SARS-CoV-2, NAA Not Detected Not Detected   Assessment & Plan:  This visit occurred during the SARS-CoV-2 public health emergency.  Safety protocols were in place, including screening questions prior to the visit, additional usage of staff PPE, and extensive cleaning of exam room while observing appropriate contact time as indicated for disinfecting solutions.   Problem List Items Addressed This Visit    Essential hypertension    Chronic, improved. Continue telmisartan. Doesn't regularly take lasix.       Aortic valve sclerosis    Echo 2019 (Dr Ubaldo Glassing) with evidence of mild AS/AI.           Meds ordered this encounter  Medications  . Ibuprofen-diphenhydrAMINE HCl (ADVIL PM) 200-25 MG CAPS    Sig: Take 1 tablet by mouth at bedtime as needed.    Dispense:  28 capsule   No orders of the defined types were placed in this encounter.   Follow up plan: Return  in about 9 months (around 03/22/2020), or if symptoms worsen or fail to improve, for annual exam, prior fasting for blood work, medicare wellness visit.  Ria Bush, MD

## 2019-06-22 NOTE — Patient Instructions (Signed)
Blood pressure is doing ok. Continue telmisartan.  Good to see you today Return as needed or in 9 months for follow up visit.

## 2019-06-22 NOTE — Assessment & Plan Note (Signed)
Chronic, improved. Continue telmisartan. Doesn't regularly take lasix.

## 2019-06-22 NOTE — Assessment & Plan Note (Addendum)
Echo 2019 (Dr Ubaldo Glassing) with evidence of mild AS/AI.

## 2019-08-04 ENCOUNTER — Encounter: Payer: Self-pay | Admitting: Family Medicine

## 2019-08-04 DIAGNOSIS — M791 Myalgia, unspecified site: Secondary | ICD-10-CM | POA: Insufficient documentation

## 2019-08-04 DIAGNOSIS — G720 Drug-induced myopathy: Secondary | ICD-10-CM | POA: Insufficient documentation

## 2019-08-04 DIAGNOSIS — T466X5A Adverse effect of antihyperlipidemic and antiarteriosclerotic drugs, initial encounter: Secondary | ICD-10-CM | POA: Insufficient documentation

## 2019-11-11 ENCOUNTER — Other Ambulatory Visit: Payer: Self-pay | Admitting: Family Medicine

## 2019-11-20 DIAGNOSIS — Z0130 Encounter for examination of blood pressure without abnormal findings: Secondary | ICD-10-CM | POA: Diagnosis not present

## 2019-11-22 ENCOUNTER — Telehealth: Payer: Self-pay

## 2019-11-22 NOTE — Telephone Encounter (Signed)
Pt returned call and reports she does not think her BP cuff was working correctly and that sometimes it will be high and sometimes low. Pt did go to [facility] and her BP was ok there. Pt did not have any symptoms associated with hypotension. Advised pt she will need to sign a record release so Dr. Darnell Level can see the UC records. She will call them and go by if necessary to have the records sent here. Advised if any further BP issues to contact this office. Pt verbalized understanding.

## 2019-11-22 NOTE — Telephone Encounter (Signed)
[place] Night - Client TELEPHONE ADVICE RECORD [facility] Patient Name: Monica Wise Gender: Female DOB: 05/20/1946 Age: 74 Y 18 M 13 D Return Phone Number: [phone] (Primary) Address: City/State/Zip: [place] [address] Client [facility] Night - Client Client Site [place] Physician Ria Bush - MD Contact Type Call Who Is Calling Patient / Member / Family / Caregiver Call Type Triage / Clinical Relationship To Patient Self Return Phone Number [phone] (Primary) Chief Complaint BREATHING - shortness of breath or sounds breathless Reason for Call Symptomatic / Request for [name], states her bp dropped to 96/50, took her telemesartin 79mmg with shortness of breath because of copd HR 72 Translation No Nurse Assessment Nurse: Hardin Negus, RN, Mardene Celeste Date/Time Eilene Ghazi Time): 11/20/2019 1:32:23 PM Confirm and document reason for call. If symptomatic, describe symptoms. ---Her bp dropped to 96/50, took her telemesartin 80mmg with shortness of breath because of COPD. She is feeling more tired than normal. She has taken her vitamin and drinking fluids. Has the patient had close contact with a person known or suspected to have the novel coronavirus illness OR traveled / lives in area with major community spread (including international travel) in the last 14 days from the onset of symptoms? * If Asymptomatic, screen for exposure and travel within the last 14 days. ---No Does the patient have any new or worsening symptoms? ---Yes Will a triage be completed? ---Yes Related visit to physician within the last 2 weeks? ---No Does the PT have any chronic conditions? (i.e. diabetes, asthma, this includes High risk factors for pregnancy, etc.) ---Yes List chronic conditions. ---hypertension,COPD, heart murmur, asthma, allergies Is this a behavioral health or  substance abuse call? ---No Guidelines Guideline Title Affirmed Question Affirmed Notes Nurse Date/Time (Eastern Time) Blood Pressure - Low [id number] Systolic BP [id number] AND [id number] taking blood pressure medications AND [3] dizzy, lightheaded or weak Oren Bracket 11/20/2019 1:34:51 PMPLEASE NOTE: All timestamps contained within this report are represented as [place] Standard Time. CONFIDENTIALTY NOTICE: This fax transmission is intended only for the addressee. It contains information that is legally privileged, confidential or otherwise protected from use or disclosure. If you are not the intended recipient, you are strictly prohibited from reviewing, disclosing, copying using or disseminating any of this information or taking any action in reliance on or regarding this information. If you have received this fax in error, please notify us immediately by telephone so that we can arrange for its return to [place]. Phone: [phone], Toll-Free: [phone], Fax: [phone] Page: 2 of 2 Call Id: [id number] [place]. Time Eilene Ghazi Time) Disposition Final User 11/20/2019 1:31:04 PM Send to Urgent Queue Ilona Sorrel 11/20/2019 1:44:27 PM Paged On Call back to [facility], RNMardene Celeste 11/20/2019 1:51:16 PM Attempt made - message left Oren Bracket 11/20/2019 1:45:10 PM See HCP within 4 Hours (or PCP triage) Yes Hardin Negus, RN, [place] Disagree/Comply Comply Caller Understands Yes PreDisposition Go to ED Care Advice Given Per Guideline SEE HCP WITHIN 4 HOURS (OR PCP TRIAGE): CALL BACK IF: Comments User: Ledora Bottcher, RN Date/Time ([place] Time): 11/20/2019 2:05:39 PM She will go to the UC. Referrals GO TO FACILITY UNDECIDED Paging DoctorName Phone DateTime Result/Outcome Message Type Notes Dimple Nanas- MD [id number] 11/20/2019 1:44:27 PM Paged On Call Back to Call Center Doctor Paged please call Dimple Nanas- MD 11/20/2019 1:50:53 PM Spoke with On Call - [place] he MD an update  on the pt. She agrees if the pt is more tired, having SOB, or dizziness she may need fluids and should be seen in UC since the office is not open today.

## 2019-11-22 NOTE — Telephone Encounter (Signed)
LVM on both numbers. If pt went to UC we will need her to sign a release with them or at our office so we can fax it and get notes and any testing.

## 2019-11-22 NOTE — Telephone Encounter (Signed)
Noted. Thank you. Will await [facility] records.

## 2019-11-22 NOTE — Telephone Encounter (Signed)
Pt came by office and signed a record release. Faxed release to [facility] at [phone]

## 2019-11-24 ENCOUNTER — Telehealth: Payer: Self-pay

## 2019-11-24 DIAGNOSIS — Z87891 Personal history of nicotine dependence: Secondary | ICD-10-CM

## 2019-11-24 DIAGNOSIS — Z122 Encounter for screening for malignant neoplasm of respiratory organs: Secondary | ICD-10-CM

## 2019-11-24 NOTE — Telephone Encounter (Signed)
Message left notifying patient that it is time to schedule the low dose lung cancer screening CT scan.  Instructed patient to return call to Shawn Perkins at [phone] to verify information prior to CT scan being scheduled.    

## 2019-11-25 NOTE — Telephone Encounter (Signed)
Patient has been notified that annual lung cancer screening low dose CT scan is due currently or will be in near future. Confirmed that patient is within the age range of 55-77, and asymptomatic, (no signs or symptoms of lung cancer). Patient denies illness that would prevent curative treatment for lung cancer if found. Verified smoking history, (former, quit 12/1/9, 30 pack year). The shared decision making visit was done 10/15/16. Patient is agreeable for CT scan being scheduled.

## 2019-11-25 NOTE — Addendum Note (Signed)
Addended by: Lieutenant Diego on: 11/25/2019 10:52 AM   Modules accepted: Orders

## 2019-12-03 ENCOUNTER — Ambulatory Visit
Admission: RE | Admit: 2019-12-03 | Discharge: 2019-12-03 | Disposition: A | Discharge status: Home / Self Care | Payer: Medicare Other | Source: Ambulatory Visit | Attending: Oncology | Admitting: Oncology

## 2019-12-03 ENCOUNTER — Other Ambulatory Visit: Payer: Self-pay

## 2019-12-03 DIAGNOSIS — Z122 Encounter for screening for malignant neoplasm of respiratory organs: Secondary | ICD-10-CM | POA: Diagnosis not present

## 2019-12-03 DIAGNOSIS — Z87891 Personal history of nicotine dependence: Secondary | ICD-10-CM | POA: Insufficient documentation

## 2019-12-09 ENCOUNTER — Encounter: Payer: Self-pay | Admitting: *Deleted

## 2020-02-29 DIAGNOSIS — J432 Centrilobular emphysema: Secondary | ICD-10-CM | POA: Diagnosis not present

## 2020-02-29 DIAGNOSIS — I6523 Occlusion and stenosis of bilateral carotid arteries: Secondary | ICD-10-CM | POA: Diagnosis not present

## 2020-02-29 DIAGNOSIS — I358 Other nonrheumatic aortic valve disorders: Secondary | ICD-10-CM | POA: Diagnosis not present

## 2020-02-29 DIAGNOSIS — I2510 Atherosclerotic heart disease of native coronary artery without angina pectoris: Secondary | ICD-10-CM | POA: Diagnosis not present

## 2020-02-29 DIAGNOSIS — I10 Essential (primary) hypertension: Secondary | ICD-10-CM | POA: Diagnosis not present

## 2020-03-14 ENCOUNTER — Other Ambulatory Visit: Payer: Self-pay | Admitting: Family Medicine

## 2020-03-14 DIAGNOSIS — E785 Hyperlipidemia, unspecified: Secondary | ICD-10-CM

## 2020-03-14 DIAGNOSIS — I10 Essential (primary) hypertension: Secondary | ICD-10-CM

## 2020-03-15 ENCOUNTER — Other Ambulatory Visit (INDEPENDENT_AMBULATORY_CARE_PROVIDER_SITE_OTHER): Payer: Medicare Other

## 2020-03-15 ENCOUNTER — Other Ambulatory Visit: Payer: Self-pay

## 2020-03-15 DIAGNOSIS — I10 Essential (primary) hypertension: Secondary | ICD-10-CM

## 2020-03-15 DIAGNOSIS — E785 Hyperlipidemia, unspecified: Secondary | ICD-10-CM

## 2020-03-15 LAB — LIPID PANEL
Cholesterol: 201 mg/dL — ABNORMAL HIGH (ref 0–200)
HDL: 67.3 mg/dL (ref >39.00)
LDL Cholesterol: 113 mg/dL — ABNORMAL HIGH (ref 0–99)
NonHDL: 133.39
Total CHOL/HDL Ratio: 3
Triglycerides: 100 mg/dL (ref 0.0–149.0)
VLDL: 20 mg/dL (ref 0.0–40.0)

## 2020-03-15 LAB — COMPREHENSIVE METABOLIC PANEL
ALT: 14 U/L (ref 0–35)
AST: 16 U/L (ref 0–37)
Albumin: 4 g/dL (ref 3.5–5.2)
Alkaline Phosphatase: 62 U/L (ref 39–117)
BUN: 12 mg/dL (ref 6–23)
CO2: 27 mEq/L (ref 19–32)
Calcium: 9.2 mg/dL (ref 8.4–10.5)
Chloride: 106 mEq/L (ref 96–112)
Creatinine, Ser: 0.88 mg/dL (ref 0.40–1.20)
GFR: 62.47 mL/min (ref >60.00)
Glucose, Bld: 108 mg/dL — ABNORMAL HIGH (ref 70–99)
Potassium: 4 mEq/L (ref 3.5–5.1)
Sodium: 141 mEq/L (ref 135–145)
Total Bilirubin: 0.5 mg/dL (ref 0.2–1.2)
Total Protein: 7.3 g/dL (ref 6.0–8.3)

## 2020-03-15 LAB — MICROALBUMIN / CREATININE URINE RATIO
Creatinine,U: 56.3 mg/dL
Microalb Creat Ratio: 1.4 mg/g (ref 0.0–30.0)
Microalb, Ur: 0.8 mg/dL (ref 0.0–1.9)

## 2020-03-18 ENCOUNTER — Other Ambulatory Visit: Payer: Self-pay | Admitting: Family Medicine

## 2020-03-20 ENCOUNTER — Ambulatory Visit: Payer: Medicare Other

## 2020-03-21 ENCOUNTER — Other Ambulatory Visit: Payer: Self-pay

## 2020-03-21 ENCOUNTER — Ambulatory Visit (INDEPENDENT_AMBULATORY_CARE_PROVIDER_SITE_OTHER): Payer: Medicare Other

## 2020-03-21 DIAGNOSIS — Z0000 Encounter for general adult medical examination without abnormal findings: Secondary | ICD-10-CM

## 2020-03-21 NOTE — Progress Notes (Signed)
PCP notes:  Health Maintenance: Flu- due Colonoscopy- due Mammogram- due dexa- due   Abnormal Screenings: none   Patient concerns: none   Nurse concerns: none   Next PCP appt.: 03/22/2020 @ 4 pm

## 2020-03-21 NOTE — Progress Notes (Signed)
Subjective:   Monica Wise is a 74 y.o. female who presents for Medicare Annual (Subsequent) preventive examination.  Review of Systems: N/A      I connected with the patient today by telephone and verified that I am speaking with the correct person using two identifiers. Location patient: home Location nurse: work Persons participating in the telephone visit: patient, nurse.   I discussed the limitations, risks, security and privacy concerns of performing an evaluation and management service by telephone and the availability of in person appointments. I also discussed with the patient that there may be a patient responsible charge related to this service. The patient expressed understanding and verbally consented to this telephonic visit.        Cardiac Risk Factors include: advanced age (>85men, >61 women);hypertension;Other (see comment), Risk factor comments: hyperlipidemia     Objective:    Today's Vitals   03/21/20 1527  PainSc: 5    There is no height or weight on file to calculate BMI.  Advanced Directives 03/21/2020 03/18/2019 10/09/2017 10/03/2016  Does Patient Have a Medical Advance Directive? No No Yes Yes  Type of Advance Directive - - Healthcare Power of [place] in Chart? - - No - copy requested No - copy requested  Would patient like information on creating a medical advance directive? No - Patient declined No - Patient declined - -    Current Medications (verified) Outpatient Encounter Medications as of 03/21/2020  Medication Sig  . albuterol (PROVENTIL HFA;VENTOLIN HFA) 108 (90 Base) MCG/ACT inhaler Inhale 1-2 puffs into the lungs every 6 (six) hours as needed for wheezing or shortness of breath.  Marland Kitchen aspirin-acetaminophen-caffeine (EXCEDRIN MIGRAINE) 250-250-65 MG tablet Take 1 tablet by mouth every 6 (six) hours as needed for headache.  Marland Kitchen azelastine (ASTELIN) 0.1 % nasal spray Place 1 spray into  both nostrils 2 (two) times daily. Use in each nostril as directed  . b complex vitamins tablet Take 1 tablet by mouth as needed.  . benzonatate (TESSALON) 100 MG capsule Take 1 capsule (100 mg total) by mouth 3 (three) times daily as needed for cough.  . CETIRIZINE HCL PO Take 1 tablet by mouth as needed.  . Cholecalciferol (VITAMIN D) 2000 units CAPS Take 1 capsule by mouth daily.  Marland Kitchen ezetimibe (ZETIA) 10 MG tablet TAKE 1 TABLET BY MOUTH EVERY DAY  . famotidine (PEPCID) 20 MG tablet Take 20 mg by mouth as needed.   . fluticasone (FLONASE) 50 MCG/ACT nasal spray Place 1 spray into both nostrils 2 (two) times daily.  . furosemide (LASIX) 20 MG tablet TAKE 1 TABLET (20 MG TOTAL) BY MOUTH DAILY AS NEEDED FOR FLUID.  Marland Kitchen HYDROcodone-acetaminophen (NORCO/VICODIN) 5-325 MG tablet Take 1 tablet by mouth every 6 (six) hours as needed for moderate pain.  . Ibuprofen-diphenhydrAMINE HCl (ADVIL PM) 200-25 MG CAPS Take 1 tablet by mouth at bedtime as needed.  . Loperamide HCl (ANTI-DIARRHEAL PO) Take by mouth.  . Melatonin 10 MG TABS Take by mouth as needed.  . Melatonin 5 MG TABS Take by mouth as needed.  . mometasone (ELOCON) 0.1 % cream Apply 1 application topically daily. (Patient taking differently: Apply 1 application topically as needed. )  . montelukast (SINGULAIR) 10 MG tablet TAKE 1 TABLET BY MOUTH EVERY DAY IN THE EVENING  . Multiple Vitamin (MULTIVITAMIN) tablet Take 1 tablet by mouth daily.  . potassium chloride (K-DUR) 10 MEQ tablet Take 1 tablet (10  mEq total) by mouth daily as needed. With lasix  . SYMBICORT 160-4.5 MCG/ACT inhaler TAKE 2 PUFFS BY MOUTH TWICE A DAY  . telmisartan (MICARDIS) 20 MG tablet Take 20 mg by mouth daily.  Marland Kitchen tiotropium (SPIRIVA HANDIHALER) 18 MCG inhalation capsule Place 1 capsule (18 mcg total) into inhaler and inhale daily.  . TURMERIC PO Take 1,000 mg by mouth daily.   No facility-administered encounter medications on file as of 03/21/2020.    Allergies  (verified) Altace [ramipril], Benicar [olmesartan], Codeine, Demerol [meperidine], Statins, and Sulfa antibiotics   History: Past Medical History:  Diagnosis Date  . Adrenal adenoma, right 10/18/2016   Incidental by CT 09/2016  . Aortic valve sclerosis 2014   withoout stenosis, mild aortic insufficiency - Dr Ubaldo Glassing  . Asthma   . CAD (coronary artery disease) 10/18/2016   By CT 09/2016  . Centrilobular emphysema ([place]) 10/18/2016   By CT 09/2016  . Chronic diarrhea 2011   worse in am, ?IBS  . COPD (chronic obstructive pulmonary disease) ([place]) 2005   chronic bronchitis - spirometry FVC 70%, FEV1 72%, abnormal loop volume (unknown date)  . Essential hypertension   . Ex-smoker   . GERD (gastroesophageal reflux disease)   . History of chicken pox   . History of measles   . Hyperlipidemia   . IBS (irritable bowel syndrome)   . Lumbago   . Lump or mass in breast 2012   right breast US guided finesse 1 o'clock  . Obesity   . Osteoarthritis    knees s/p surgeries  . Osteopenia 09/2014   mild by DEXA (-1.2 femur neck)  . Perennial allergic rhinitis    Past Surgical History:  Procedure Laterality Date  . BREAST BIOPSY Right 2012  . BREAST LUMPECTOMY Left 1995   benign mass  . CARDIOVASCULAR STRESS TEST  12/2004   nl myoview, preserved LV fxn with EF 71% without reversible ischemia ([facility])  . COLONOSCOPY  2010   hyperplastic polyps identified. rpt 10 yrs ([facility])  . DEXA  09/2014   T -1.2 femur neck  . ESOPHAGOGASTRODUODENOSCOPY  2010   [facility]  . KNEE SURGERY Right 2010   arthroscopic  . KNEE SURGERY Left 2014   Family History  Problem Relation Age of Onset  . Diverticulitis Mother   . Heart failure Mother   . Cancer Maternal Aunt        bone, and unsure  . CAD Father 56       MI x3  . Sudden death Brother 59   Social History   Socioeconomic History  . Marital status: Divorced    Spouse name: Not on file  . Number of children: Not on file  . Years of education: Not on  file  . Highest education level: Not on file  Occupational History  . Not on file  Tobacco Use  . Smoking status: Former Smoker    Packs/day: 1.00    Years: 30.00    Pack years: 30.00    Types: Cigarettes    Quit date: 05/31/2008    Years since quitting: 11.8  . Smokeless tobacco: Never Used  Vaping Use  . Vaping Use: Never used  Substance and Sexual Activity  . Alcohol use: Yes    Alcohol/week: 0.0 standard drinks    Comment: ocassionally wine  . Drug use: No  . Sexual activity: Not Currently  Other Topics Concern  . Not on file  Social History Narrative   Lives alone, 2 feral cats  Occupation: retired, worked for [employer]   Edu: [employer]   Activity: works in yard, unable to walk 2/2 knee pain   Diet: good water, fruits/vegetables daily   Social Determinants of Radio broadcast assistant Strain: Low Risk   . Difficulty of Paying Living Expenses: Not hard at all  Food Insecurity: No Food Insecurity  . Worried About Charity fundraiser in the Last Year: Never true  . Ran Out of Food in the Last Year: Never true  Transportation Needs: No Transportation Needs  . Lack of Transportation (Medical): No  . Lack of Transportation (Non-Medical): No  Physical Activity: Inactive  . Days of Exercise per Week: 0 days  . Minutes of Exercise per Session: 0 min  Stress: No Stress Concern Present  . Feeling of Stress : Not at all  Social Connections:   . Frequency of Communication with Friends and Family: Not on file  . Frequency of Social Gatherings with Friends and Family: Not on file  . Attends Religious Services: Not on file  . Active Member of Clubs or Organizations: Not on file  . Attends Archivist Meetings: Not on file  . Marital Status: Not on file    Tobacco Counseling Counseling given: Not Answered   Clinical Intake:  Pre-visit preparation completed: Yes  Pain : 0-10 Pain Score: 5  Pain Type: Chronic pain Pain Location: Knee Pain Orientation:  Left, Right Pain Descriptors / Indicators: Aching Pain Onset: More than a month ago Pain Frequency: Intermittent     Nutritional Risks: Nausea/ vomitting/ diarrhea (diarrhea sometimes) Diabetes: No  How often do you need to have someone help you when you read instructions, pamphlets, or other written materials from your doctor or pharmacy?: 1 - Never What is the last grade level you completed in school?: business college  Diabetic: No Nutrition Risk Assessment:  Has the patient had any N/V/D within the last 2 months?  Yes , diarrhea sometimes Does the patient have any non-healing wounds?  No  Has the patient had any unintentional weight loss or weight gain?  No   Diabetes:  Is the patient diabetic?  No  If diabetic, was a CBG obtained today?  N/A Did the patient bring in their glucometer from home?  N/A How often do you monitor your CBG's? N/A.   Financial Strains and Diabetes Management:  Are you having any financial strains with the device, your supplies or your medication? N/A.  Does the patient want to be seen by Chronic Care Management for management of their diabetes?  N/A Would the patient like to be referred to a Nutritionist or for Diabetic Management?  N/A   Interpreter Needed?: No  Information entered by :: CJohnson, LPN   Activities of Daily Living In your present state of health, do you have any difficulty performing the following activities: 03/21/2020  Hearing? N  Vision? N  Difficulty concentrating or making decisions? N  Walking or climbing stairs? N  Dressing or bathing? N  Doing errands, shopping? N  Preparing Food and eating ? N  Using the Toilet? N  In the past six months, have you accidently leaked urine? N  Do you have problems with loss of bowel control? N  Managing your Medications? N  Managing your Finances? N  Housekeeping or managing your Housekeeping? N  Some recent data might be hidden    Patient Care Team: Ria Bush, MD  as PCP - General (Family Medicine) Bary Castilla Forest Gleason, MD (General  Surgery) Lorelee Market, MD (Family Medicine)  Indicate any recent Medical Services you may have received from other than [facility] providers in the past year (date may be approximate).     Assessment:   This is a routine wellness examination for Jaleah.  Hearing/Vision screen  Hearing Screening   125Hz  250Hz  500Hz  1000Hz  2000Hz  3000Hz  4000Hz  6000Hz  8000Hz   Right ear:           Left ear:           Vision Screening Comments: Patient gets annual eye exams  Dietary issues and exercise activities discussed: Current Exercise Habits: The patient does not participate in regular exercise at present, Exercise limited by: None identified  Goals    . DIET - INCREASE WATER INTAKE     Starting 10/09/2017, I will continue to drink 5-6 glasses of water daily.     . Patient Stated     03/18/19, Patient states that she would like to be able to walk better.     . Patient Stated     03/21/2020, I will maintain and continue medications as prescribed.     . safety     Starting 10/03/16, I will continue to use cane as needed to reduce risk of falls.       Depression Screen PHQ 2/9 Scores 03/21/2020 03/18/2019 10/09/2017 10/03/2016 09/26/2015 09/19/2014  PHQ - 2 Score 0 0 2 1 0 0  PHQ- 9 Score 0 0 7 - - -    Fall Risk Fall Risk  03/21/2020 03/18/2019 06/16/2018 10/09/2017 10/03/2016  Falls in the past year? 0 0 0 Yes Yes  Comment - - - fell off porch into flower bed; bruising only with no medical treatment pt fell on 06/27/2016 while at friend's house  Number falls in past yr: 0 0 - 1 1  Injury with Fall? 0 - - Yes Yes  Risk for fall due to : Medication side effect Medication side effect;Impaired balance/gait - - -  Follow up Falls evaluation completed;Falls prevention discussed Falls evaluation completed;Falls prevention discussed - - -    Any stairs in or around the home? Yes  If so, are there any without handrails? No  Home free of loose  throw rugs in walkways, pet beds, electrical cords, etc? Yes  Adequate lighting in your home to reduce risk of falls? Yes   ASSISTIVE DEVICES UTILIZED TO PREVENT FALLS:  Life alert? No  Use of a cane, walker or w/c? Yes  Grab bars in the bathroom? No  Shower chair or bench in shower? No  Elevated toilet seat or a handicapped toilet? No   TIMED UP AND GO:  Was the test performed? N/A, telephonic visit .    Cognitive Function: MMSE - Mini Mental State Exam 03/21/2020 03/18/2019 10/09/2017 10/03/2016  Orientation to time 5 5 5 5   Orientation to Place 5 5 5 5   Registration 3 3 3 3   Attention/ Calculation 5 5 0 0  Recall 3 3 3 3   Language- name 2 objects - - 0 0  Language- repeat 1 1 1 1   Language- follow 3 step command - - 3 3  Language- read & follow direction - - 0 0  Write a sentence - - 0 0  Copy design - - 0 0  Total score - - 20 20  Mini Cog  Mini-Cog screen was completed. Maximum score is 22. A value of 0 denotes this part of the MMSE was not completed or the patient failed  this part of the Mini-Cog screening.       Immunizations Immunization History  Administered Date(s) Administered  . Fluad Quad(high Dose 65+) 04/06/2019  . Influenza, High Dose Seasonal PF 06/12/2017, 04/18/2018  . Influenza-Unspecified 03/31/2014, 03/02/2015, 05/22/2016  . PFIZER SARS-COV-2 Vaccination 07/28/2019, 08/18/2019  . Pneumococcal Conjugate-13 10/03/2015  . Pneumococcal Polysaccharide-23 10/03/2016  . Pneumococcal-Unspecified 07/02/1999  . Zoster 07/04/2014    TDAP status: Due, Education has been provided regarding the importance of this vaccine. Advised may receive this vaccine at local pharmacy or Health Dept. Aware to provide a copy of the vaccination record if obtained from local pharmacy or Health Dept. Verbalized acceptance and understanding. Flu Vaccine status: due, will get at upcoming office visit Pneumococcal vaccine status: Up to date Covid-19 vaccine status: Completed  vaccines  Qualifies for Shingles Vaccine? Yes   Zostavax completed Yes   Shingrix Completed?: No.    Education has been provided regarding the importance of this vaccine. Patient has been advised to call insurance company to determine out of pocket expense if they have not yet received this vaccine. Advised may also receive vaccine at local pharmacy or Health Dept. Verbalized acceptance and understanding.  Screening Tests Health Maintenance  Topic Date Due  . MAMMOGRAM  06/09/2019  . INFLUENZA VACCINE  01/30/2020  . TETANUS/TDAP  03/21/2024 (Originally 03/10/1965)  . DEXA SCAN  Completed  . COVID-19 Vaccine  Completed  . Hepatitis C Screening  Completed  . PNA vac Low Risk Adult  Completed    Health Maintenance  Health Maintenance Due  Topic Date Due  . MAMMOGRAM  06/09/2019  . INFLUENZA VACCINE  01/30/2020    Colorectal cancer screening: due, will discuss with her surgeon next week.  Mammogram status: due, will have this scheduled soon Bone Density status: due, will discuss with provider  Lung Cancer Screening: (Low Dose CT Chest recommended if Age 37-80 years, 30 pack-year currently smoking OR have quit w/in 15 years.) does not qualify.    Additional Screening:  Hepatitis C Screening: does qualify; Completed 09/18/2015  Vision Screening: Recommended annual ophthalmology exams for early detection of glaucoma and other disorders of the eye. Is the patient up to date with their annual eye exam?  No, will schedule appointment Who is the provider or what is the name of the office in which the patient attends annual eye exams? [facility] If pt is not established with a provider, would they like to be referred to a provider to establish care? No .   Dental Screening: Recommended annual dental exams for proper oral hygiene  Community Resource Referral / Chronic Care Management: CRR required this visit?  No   CCM required this visit?  No      Plan:     I have  personally reviewed and noted the following in the patient's chart:   . Medical and social history . Use of alcohol, tobacco or illicit drugs  . Current medications and supplements . Functional ability and status . Nutritional status . Physical activity . Advanced directives . List of other physicians . Hospitalizations, surgeries, and ER visits in previous 12 months . Vitals . Screenings to include cognitive, depression, and falls . Referrals and appointments  In addition, I have reviewed and discussed with patient certain preventive protocols, quality metrics, and best practice recommendations. A written personalized care plan for preventive services as well as general preventive health recommendations were provided to patient.    Due to this being a telephonic visit, the  after visit summary with patients personalized plan was offered to patient via office or my-chart. Patient preferred to pick up at office at next visit or via mychart.  Andrez Grime, LPN   1/[id number]/0992

## 2020-03-21 NOTE — Patient Instructions (Signed)
Monica Wise , Thank you for taking time to come for your Medicare Wellness Visit. I appreciate your ongoing commitment to your health goals. Please review the following plan we discussed and let me know if I can assist you in the future.   Screening recommendations/referrals: Colonoscopy: due, will discuss with her surgeon next week. Mammogram: due, will have this scheduled soon Bone Density: due, will discuss with provider Recommended yearly ophthalmology/optometry visit for glaucoma screening and checkup Recommended yearly dental visit for hygiene and checkup  Vaccinations: Influenza vaccine: due, will get at office visit  Pneumococcal vaccine: Completed series Tdap vaccine: decline-insuraance Shingles vaccine: due, check with your insurance regarding coverage if interested   Covid-19:Completed series  Advanced directives: Advance directive discussed with you today. Even though you declined this today please call our office should you change your mind and we can give you the proper paperwork for you to fill out.  Conditions/risks identified: hypertension, hyperlipidemia  Next appointment: Follow up in one year for your annual wellness visit    Preventive Care 7 Years and Older, Female Preventive care refers to lifestyle choices and visits with your health care provider that can promote health and wellness. What does preventive care include?  A yearly physical exam. This is also called an annual well check.  Dental exams once or twice a year.  Routine eye exams. Ask your health care provider how often you should have your eyes checked.  Personal lifestyle choices, including:  Daily care of your teeth and gums.  Regular physical activity.  Eating a healthy diet.  Avoiding tobacco and drug use.  Limiting alcohol use.  Practicing safe sex.  Taking low-dose aspirin every day.  Taking vitamin and mineral supplements as recommended by your health care provider. What  happens during an annual well check? The services and screenings done by your health care provider during your annual well check will depend on your age, overall health, lifestyle risk factors, and family history of disease. Counseling  Your health care provider may ask you questions about your:  Alcohol use.  Tobacco use.  Drug use.  Emotional well-being.  Home and relationship well-being.  Sexual activity.  Eating habits.  History of falls.  Memory and ability to understand (cognition).  Work and work Statistician.  Reproductive health. Screening  You may have the following tests or measurements:  Height, weight, and BMI.  Blood pressure.  Lipid and cholesterol levels. These may be checked every 5 years, or more frequently if you are over 37 years old.  Skin check.  Lung cancer screening. You may have this screening every year starting at age 12 if you have a 30-pack-year history of smoking and currently smoke or have quit within the past 15 years.  Fecal occult blood test (FOBT) of the stool. You may have this test every year starting at age 27.  Flexible sigmoidoscopy or colonoscopy. You may have a sigmoidoscopy every 5 years or a colonoscopy every 10 years starting at age 42.  Hepatitis C blood test.  Hepatitis B blood test.  Sexually transmitted disease (STD) testing.  Diabetes screening. This is done by checking your blood sugar (glucose) after you have not eaten for a while (fasting). You may have this done every 1-3 years.  Bone density scan. This is done to screen for osteoporosis. You may have this done starting at age 12.  Mammogram. This may be done every 1-2 years. Talk to your health care provider about how often you should have  regular mammograms. Talk with your health care provider about your test results, treatment options, and if necessary, the need for more tests. Vaccines  Your health care provider may recommend certain vaccines, such  as:  Influenza vaccine. This is recommended every year.  Tetanus, diphtheria, and acellular pertussis (Tdap, Td) vaccine. You may need a Td booster every 10 years.  Zoster vaccine. You may need this after age 55.  Pneumococcal 13-valent conjugate (PCV13) vaccine. One dose is recommended after age 50.  Pneumococcal polysaccharide (PPSV23) vaccine. One dose is recommended after age 68. Talk to your health care provider about which screenings and vaccines you need and how often you need them. This information is not intended to replace advice given to you by your health care provider. Make sure you discuss any questions you have with your health care provider. Document Released: 07/14/2015 Document Revised: 03/06/2016 Document Reviewed: 04/18/2015 Elsevier Interactive Patient Education  2017 [place] Prevention in the Home Falls can cause injuries. They can happen to people of all ages. There are many things you can do to make your home safe and to help prevent falls. What can I do on the outside of my home?  Regularly fix the edges of walkways and driveways and fix any cracks.  Remove anything that might make you trip as you walk through a door, such as a raised step or threshold.  Trim any bushes or trees on the path to your home.  Use bright outdoor lighting.  Clear any walking paths of anything that might make someone trip, such as rocks or tools.  Regularly check to see if handrails are loose or broken. Make sure that both sides of any steps have handrails.  Any raised decks and porches should have guardrails on the edges.  Have any leaves, snow, or ice cleared regularly.  Use sand or salt on walking paths during winter.  Clean up any spills in your garage right away. This includes oil or grease spills. What can I do in the bathroom?  Use night lights.  Install grab bars by the toilet and in the tub and shower. Do not use towel bars as grab bars.  Use  non-skid mats or decals in the tub or shower.  If you need to sit down in the shower, use a plastic, non-slip stool.  Keep the floor dry. Clean up any water that spills on the floor as soon as it happens.  Remove soap buildup in the tub or shower regularly.  Attach bath mats securely with double-sided non-slip rug tape.  Do not have throw rugs and other things on the floor that can make you trip. What can I do in the bedroom?  Use night lights.  Make sure that you have a light by your bed that is easy to reach.  Do not use any sheets or blankets that are too big for your bed. They should not hang down onto the floor.  Have a firm chair that has side arms. You can use this for support while you get dressed.  Do not have throw rugs and other things on the floor that can make you trip. What can I do in the kitchen?  Clean up any spills right away.  Avoid walking on wet floors.  Keep items that you use a lot in easy-to-reach places.  If you need to reach something above you, use a strong step stool that has a grab bar.  Keep electrical cords out of the  way.  Do not use floor polish or wax that makes floors slippery. If you must use wax, use non-skid floor wax.  Do not have throw rugs and other things on the floor that can make you trip. What can I do with my stairs?  Do not leave any items on the stairs.  Make sure that there are handrails on both sides of the stairs and use them. Fix handrails that are broken or loose. Make sure that handrails are as long as the stairways.  Check any carpeting to make sure that it is firmly attached to the stairs. Fix any carpet that is loose or worn.  Avoid having throw rugs at the top or bottom of the stairs. If you do have throw rugs, attach them to the floor with carpet tape.  Make sure that you have a light switch at the top of the stairs and the bottom of the stairs. If you do not have them, ask someone to add them for you. What  else can I do to help prevent falls?  Wear shoes that:  Do not have high heels.  Have rubber bottoms.  Are comfortable and fit you well.  Are closed at the toe. Do not wear sandals.  If you use a stepladder:  Make sure that it is fully opened. Do not climb a closed stepladder.  Make sure that both sides of the stepladder are locked into place.  Ask someone to hold it for you, if possible.  Clearly mark and make sure that you can see:  Any grab bars or handrails.  First and last steps.  Where the edge of each step is.  Use tools that help you move around (mobility aids) if they are needed. These include:  Canes.  Walkers.  Scooters.  Crutches.  Turn on the lights when you go into a dark area. Replace any light bulbs as soon as they burn out.  Set up your furniture so you have a clear path. Avoid moving your furniture around.  If any of your floors are uneven, fix them.  If there are any pets around you, be aware of where they are.  Review your medicines with your doctor. Some medicines can make you feel dizzy. This can increase your chance of falling. Ask your doctor what other things that you can do to help prevent falls. This information is not intended to replace advice given to you by your health care provider. Make sure you discuss any questions you have with your health care provider. Document Released: 04/13/2009 Document Revised: 11/23/2015 Document Reviewed: 07/22/2014 Elsevier Interactive Patient Education  2017 [employer].

## 2020-03-22 ENCOUNTER — Encounter: Payer: Self-pay | Admitting: Family Medicine

## 2020-03-22 ENCOUNTER — Ambulatory Visit (INDEPENDENT_AMBULATORY_CARE_PROVIDER_SITE_OTHER): Payer: Medicare Other | Admitting: Family Medicine

## 2020-03-22 ENCOUNTER — Other Ambulatory Visit: Payer: Self-pay

## 2020-03-22 VITALS — BP 158/78 | HR 73 | Temp 98.2°F | Ht 63.5 in | Wt 220.5 lb

## 2020-03-22 DIAGNOSIS — I358 Other nonrheumatic aortic valve disorders: Secondary | ICD-10-CM

## 2020-03-22 DIAGNOSIS — Z23 Encounter for immunization: Secondary | ICD-10-CM

## 2020-03-22 DIAGNOSIS — J432 Centrilobular emphysema: Secondary | ICD-10-CM

## 2020-03-22 DIAGNOSIS — M85859 Other specified disorders of bone density and structure, unspecified thigh: Secondary | ICD-10-CM | POA: Diagnosis not present

## 2020-03-22 DIAGNOSIS — T466X5A Adverse effect of antihyperlipidemic and antiarteriosclerotic drugs, initial encounter: Secondary | ICD-10-CM

## 2020-03-22 DIAGNOSIS — G720 Drug-induced myopathy: Secondary | ICD-10-CM

## 2020-03-22 DIAGNOSIS — M810 Age-related osteoporosis without current pathological fracture: Secondary | ICD-10-CM | POA: Insufficient documentation

## 2020-03-22 DIAGNOSIS — M8580 Other specified disorders of bone density and structure, unspecified site: Secondary | ICD-10-CM | POA: Insufficient documentation

## 2020-03-22 DIAGNOSIS — Z87891 Personal history of nicotine dependence: Secondary | ICD-10-CM

## 2020-03-22 DIAGNOSIS — Z0000 Encounter for general adult medical examination without abnormal findings: Secondary | ICD-10-CM

## 2020-03-22 DIAGNOSIS — I10 Essential (primary) hypertension: Secondary | ICD-10-CM

## 2020-03-22 DIAGNOSIS — E785 Hyperlipidemia, unspecified: Secondary | ICD-10-CM

## 2020-03-22 DIAGNOSIS — I6523 Occlusion and stenosis of bilateral carotid arteries: Secondary | ICD-10-CM

## 2020-03-22 DIAGNOSIS — S32010S Wedge compression fracture of first lumbar vertebra, sequela: Secondary | ICD-10-CM

## 2020-03-22 DIAGNOSIS — K219 Gastro-esophageal reflux disease without esophagitis: Secondary | ICD-10-CM

## 2020-03-22 DIAGNOSIS — I2510 Atherosclerotic heart disease of native coronary artery without angina pectoris: Secondary | ICD-10-CM

## 2020-03-22 DIAGNOSIS — Z7189 Other specified counseling: Secondary | ICD-10-CM | POA: Diagnosis not present

## 2020-03-22 MED ORDER — SPIRIVA HANDIHALER 18 MCG IN CAPS
18.0000 ug | ORAL_CAPSULE | Freq: Every day | RESPIRATORY_TRACT | 11 refills | Status: DC
Start: 2020-03-22 — End: 2020-04-15

## 2020-03-22 MED ORDER — BREO ELLIPTA 200-25 MCG/INH IN AEPB
1.0000 | INHALATION_SPRAY | Freq: Every day | RESPIRATORY_TRACT | 11 refills | Status: DC
Start: 2020-03-22 — End: 2021-03-08

## 2020-03-22 MED ORDER — BREO ELLIPTA 200-25 MCG/INH IN AEPB
1.0000 | INHALATION_SPRAY | Freq: Every day | RESPIRATORY_TRACT | 11 refills | Status: DC
Start: 2020-03-22 — End: 2020-03-22

## 2020-03-22 MED ORDER — EZETIMIBE 10 MG PO TABS
10.0000 mg | ORAL_TABLET | Freq: Every day | ORAL | 3 refills | Status: DC
Start: 2020-03-22 — End: 2021-05-10

## 2020-03-22 NOTE — Assessment & Plan Note (Signed)
Preventative protocols reviewed and updated unless pt declined. Discussed healthy diet and lifestyle.  

## 2020-03-22 NOTE — Patient Instructions (Addendum)
Flu shot today  We will request mammogram from [facility] imaging.  We will order bone density scan - to try and schedule on same day as upcoming mammogram.  Blood pressure is staying too high! Continue monitoring at home. Let me know if consistently >150/90 to increase blood pressure medicine.  Return as needed or in 6 months for blood pressure follow up.   Health Maintenance After Age 76 After age 92, you are at a higher risk for certain long-term diseases and infections as well as injuries from falls. Falls are a major cause of broken bones and head injuries in people who are older than age 24. Getting regular preventive care can help to keep you healthy and well. Preventive care includes getting regular testing and making lifestyle changes as recommended by your health care provider. Talk with your health care provider about:  Which screenings and tests you should have. A screening is a test that checks for a disease when you have no symptoms.  A diet and exercise plan that is right for you. What should I know about screenings and tests to prevent falls? Screening and testing are the best ways to find a health problem early. Early diagnosis and treatment give you the best chance of managing medical conditions that are common after age 105. Certain conditions and lifestyle choices may make you more likely to have a fall. Your health care provider may recommend:  Regular vision checks. Poor vision and conditions such as cataracts can make you more likely to have a fall. If you wear glasses, make sure to get your prescription updated if your vision changes.  Medicine review. Work with your health care provider to regularly review all of the medicines you are taking, including over-the-counter medicines. Ask your health care provider about any side effects that may make you more likely to have a fall. Tell your health care provider if any medicines that you take make you feel dizzy or  sleepy.  Osteoporosis screening. Osteoporosis is a condition that causes the bones to get weaker. This can make the bones weak and cause them to break more easily.  Blood pressure screening. Blood pressure changes and medicines to control blood pressure can make you feel dizzy.  Strength and balance checks. Your health care provider may recommend certain tests to check your strength and balance while standing, walking, or changing positions.  Foot health exam. Foot pain and numbness, as well as not wearing proper footwear, can make you more likely to have a fall.  Depression screening. You may be more likely to have a fall if you have a fear of falling, feel emotionally low, or feel unable to do activities that you used to do.  Alcohol use screening. Using too much alcohol can affect your balance and may make you more likely to have a fall. What actions can I take to lower my risk of falls? General instructions  Talk with your health care provider about your risks for falling. Tell your health care provider if: ? You fall. Be sure to tell your health care provider about all falls, even ones that seem minor. ? You feel dizzy, sleepy, or off-balance.  Take over-the-counter and prescription medicines only as told by your health care provider. These include any supplements.  Eat a healthy diet and maintain a healthy weight. A healthy diet includes low-fat dairy products, low-fat (lean) meats, and fiber from whole grains, beans, and lots of fruits and vegetables. Home safety  Remove any tripping  hazards, such as rugs, cords, and clutter.  Install safety equipment such as grab bars in bathrooms and safety rails on stairs.  Keep rooms and walkways well-lit. Activity   Follow a regular exercise program to stay fit. This will help you maintain your balance. Ask your health care provider what types of exercise are appropriate for you.  If you need a cane or walker, use it as recommended by  your health care provider.  Wear supportive shoes that have nonskid soles. Lifestyle  Do not drink alcohol if your health care provider tells you not to drink.  If you drink alcohol, limit how much you have: ? 0-1 drink a day for women. ? 0-2 drinks a day for men.  Be aware of how much alcohol is in your drink. In the [place] one drink equals one typical bottle of beer (12 oz), one-half glass of wine (5 oz), or one shot of hard liquor (1 oz).  Do not use any products that contain nicotine or tobacco, such as cigarettes and e-cigarettes. If you need help quitting, ask your health care provider. Summary  Having a healthy lifestyle and getting preventive care can help to protect your health and wellness after age 9.  Screening and testing are the best way to find a health problem early and help you avoid having a fall. Early diagnosis and treatment give you the best chance for managing medical conditions that are more common for people who are older than age 73.  Falls are a major cause of broken bones and head injuries in people who are older than age 64. Take precautions to prevent a fall at home.  Work with your health care provider to learn what changes you can make to improve your health and wellness and to prevent falls. This information is not intended to replace advice given to you by your health care provider. Make sure you discuss any questions you have with your health care provider. Document Revised: 10/08/2018 Document Reviewed: 04/30/2017 Elsevier Patient Education  2020 [employer].

## 2020-03-22 NOTE — Progress Notes (Signed)
This visit was conducted in person.  BP (!) 158/78 (BP Location: Right Arm, Patient Position: Sitting, Cuff Size: Large)   Pulse 73   Temp 98.2 F (36.8 C) (Temporal)   Ht 5' 3.5" (1.613 m)   Wt 220 lb 8 oz (100 kg)   SpO2 95%   BMI 38.45 kg/m   BP on recheck 180/90 - I suspect home arm cuff is accurate  CC: CPE Subjective:    Patient ID: Monica Wise, female    DOB: 1946/01/04, 74 y.o.   MRN: [id number]  HPI: Monica Wise is a 74 y.o. female presenting on 03/22/2020 for Annual Exam (Prt 2.  Pt brought in wrist BP monitor.  Read 159/78 in office.  Also brought arm cuff BP monitor.  Read 189/91 in office. )   Saw health advisor yesterday for medicare wellness visit. Note reviewed.   No exam data present    Clinical Support from 03/21/2020 in [place] at [facility] Total Score 0      Fall Risk  03/21/2020 03/18/2019 06/16/2018 10/09/2017 10/03/2016  Falls in the past year? 0 0 0 Yes Yes  Comment - - - fell off porch into flower bed; bruising only with no medical treatment pt fell on 06/27/2016 while at friend's house  Number falls in past yr: 0 0 - 1 1  Injury with Fall? 0 - - Yes Yes  Risk for fall due to : Medication side effect Medication side effect;Impaired balance/gait - - -  Follow up Falls evaluation completed;Falls prevention discussed Falls evaluation completed;Falls prevention discussed - - -    Home BP readings [id number] systolic.  Known sclerosis not stenosis of aortic valve   Preventative: G1P1  COLONOSCOPY Date: 2010 hyperplastic polyps identified. Rpt 10 yrs ([place]) - upcoming appt with Dr Fleet Contras end of September to discuss.  Mammogram12/2019 birads2 - will request latest mammo last year  Well woman exam - remotely. Discussed, declines further eval. Lung cancer screening -undergoing screening, reviewed CT 11/2019 - emphysema, aortic ATH, calcified CA  DEXA Date: 09/2014 T -1.2 femur neck  Flu shot - yearly  Pneumovax 2001, prevnar4/2017,  pneumovax 09/2016  COVID vaccine - [place] 07/2019, 08/2019  zostavax 07/2014  shingrix - discussed, declines for now  Advanced directive:Again reviewed with patient - son is POA. Has also discussed with local cousin. Does not want to complete advanced directive. Encouraged she talk about wishes with son.  Seat belt use discussed  Sunscreen use discussed. No changing moles on skin Ex smoker - quit 2009. 30 PY hx.  Alcohol -1-2 glass of wine a week Bowel - no constipation  Bladder - no incontinence   Lives alone, son nearby Occupation: retired, worked for [employer] Edu: [employer] Activity: works in yard, unable to walk 2/2 knee pain  Diet: good water, fruits/vegetables daily     Relevant past medical, surgical, family and social history reviewed and updated as indicated. Interim medical history since our last visit reviewed. Allergies and medications reviewed and updated. Outpatient Medications Prior to Visit  Medication Sig Dispense Refill  . albuterol (PROVENTIL HFA;VENTOLIN HFA) 108 (90 Base) MCG/ACT inhaler Inhale 1-2 puffs into the lungs every 6 (six) hours as needed for wheezing or shortness of breath. 18 g 3  . aspirin-acetaminophen-caffeine (EXCEDRIN MIGRAINE) 188-416-60 MG tablet Take 1 tablet by mouth every 6 (six) hours as needed for headache.    Marland Kitchen azelastine (ASTELIN) 0.1 % nasal spray Place 1 spray into both nostrils 2 (  two) times daily. Use in each nostril as directed    . b complex vitamins tablet Take 1 tablet by mouth as needed.    . benzonatate (TESSALON) 100 MG capsule Take 1 capsule (100 mg total) by mouth 3 (three) times daily as needed for cough. 30 capsule 0  . Cholecalciferol (VITAMIN D) 2000 units CAPS Take 1 capsule by mouth daily.    . famotidine (PEPCID) 20 MG tablet Take 20 mg by mouth as needed.     Marland Kitchen Fexofenadine HCl (ALLEGRA PO) Take by mouth daily.    . fluticasone (FLONASE) 50 MCG/ACT nasal spray Place 1 spray into both nostrils 2 (two) times daily. 16  g 0  . furosemide (LASIX) 20 MG tablet TAKE 1 TABLET (20 MG TOTAL) BY MOUTH DAILY AS NEEDED FOR FLUID. 90 tablet 0  . HYDROcodone-acetaminophen (NORCO/VICODIN) 5-325 MG tablet Take 1 tablet by mouth every 6 (six) hours as needed for moderate pain. 20 tablet 0  . Ibuprofen-diphenhydrAMINE HCl (ADVIL PM) 200-25 MG CAPS Take 1 tablet by mouth at bedtime as needed. 28 capsule   . Loperamide HCl (ANTI-DIARRHEAL PO) Take by mouth.    . Melatonin 10 MG TABS Take by mouth as needed.    . Melatonin 5 MG TABS Take by mouth as needed.    . mometasone (ELOCON) 0.1 % cream Apply 1 application topically daily. (Patient taking differently: Apply 1 application topically as needed. ) 45 g 0  . montelukast (SINGULAIR) 10 MG tablet TAKE 1 TABLET BY MOUTH EVERY DAY IN THE EVENING 90 tablet 1  . Multiple Vitamin (MULTIVITAMIN) tablet Take 1 tablet by mouth daily.    . potassium chloride (K-DUR) 10 MEQ tablet Take 1 tablet (10 mEq total) by mouth daily as needed. With lasix 30 tablet 2  . telmisartan (MICARDIS) 20 MG tablet Take 20 mg by mouth daily.    . TURMERIC PO Take 1,000 mg by mouth daily.    Marland Kitchen ezetimibe (ZETIA) 10 MG tablet TAKE 1 TABLET BY MOUTH EVERY DAY 90 tablet 3  . SYMBICORT 160-4.5 MCG/ACT inhaler TAKE 2 PUFFS BY MOUTH TWICE A DAY 10.2 each 0  . tiotropium (SPIRIVA HANDIHALER) 18 MCG inhalation capsule Place 1 capsule (18 mcg total) into inhaler and inhale daily. 30 capsule 11  . Apple Cid Vn-Grn Tea-Bit Or-Cr (APPLE CIDER VINEGAR PLUS) TABS Take 2 tablets by mouth 3 (three) times daily before meals.    Marland Kitchen CETIRIZINE HCL PO Take 1 tablet by mouth as needed. (Patient not taking: Reported on 03/22/2020)     No facility-administered medications prior to visit.     Per HPI unless specifically indicated in ROS section below Review of Systems  Constitutional: Negative for activity change, appetite change, chills, fatigue, fever and unexpected weight change.  HENT: Negative for hearing loss.   Eyes:  Negative for visual disturbance.  Respiratory: Negative for cough, chest tightness, shortness of breath and wheezing.   Cardiovascular: Negative for chest pain, palpitations and leg swelling.  Gastrointestinal: Negative for abdominal distention, abdominal pain, blood in stool, constipation, diarrhea, nausea and vomiting.  Genitourinary: Negative for difficulty urinating and hematuria.  Musculoskeletal: Negative for arthralgias, myalgias and neck pain.  Skin: Negative for rash.  Neurological: Negative for dizziness, seizures, syncope and headaches.  Hematological: Negative for adenopathy. Bruises/bleeds easily.  Psychiatric/Behavioral: Negative for dysphoric mood. The patient is not nervous/anxious.    Objective:  BP (!) 158/78 (BP Location: Right Arm, Patient Position: Sitting, Cuff Size: Large)   Pulse 73  Temp 98.2 F (36.8 C) (Temporal)   Ht 5' 3.5" (1.613 m)   Wt 220 lb 8 oz (100 kg)   SpO2 95%   BMI 38.45 kg/m   Wt Readings from Last 3 Encounters:  03/22/20 220 lb 8 oz (100 kg)  12/03/19 220 lb (99.8 kg)  06/22/19 220 lb 6 oz (100 kg)      Physical Exam Vitals and nursing note reviewed.  Constitutional:      General: She is not in acute distress.    Appearance: Normal appearance. She is well-developed. She is obese. She is not ill-appearing.  HENT:     Head: Normocephalic and atraumatic.     Right Ear: Hearing, tympanic membrane, ear canal and external ear normal.     Left Ear: Hearing, tympanic membrane, ear canal and external ear normal.  Eyes:     General: No scleral icterus.    Extraocular Movements: Extraocular movements intact.     Conjunctiva/sclera: Conjunctivae normal.     Pupils: Pupils are equal, round, and reactive to light.  Neck:     Thyroid: No thyroid mass or thyromegaly.     Vascular: Carotid bruit (L bruit) present.  Cardiovascular:     Rate and Rhythm: Normal rate and regular rhythm.     Pulses: Normal pulses.          Radial pulses are 2+ on  the right side and 2+ on the left side.     Heart sounds: Murmur (3/6 systolic USB) heard.   Pulmonary:     Effort: Pulmonary effort is normal. No respiratory distress.     Breath sounds: Normal breath sounds. No wheezing, rhonchi or rales.  Abdominal:     General: Abdomen is flat. Bowel sounds are normal. There is no distension.     Palpations: Abdomen is soft. There is no mass.     Tenderness: There is no abdominal tenderness. There is no guarding or rebound.     Hernia: No hernia is present.  Musculoskeletal:        General: Normal range of motion.     Cervical back: Normal range of motion and neck supple.     Right lower leg: No edema.     Left lower leg: No edema.  Lymphadenopathy:     Cervical: No cervical adenopathy.  Skin:    General: Skin is warm and dry.     Findings: No rash.  Neurological:     General: No focal deficit present.     Mental Status: She is alert and oriented to person, place, and time.     Comments: CN grossly intact, station and gait intact  Psychiatric:        Mood and Affect: Mood normal.        Behavior: Behavior normal.        Thought Content: Thought content normal.        Judgment: Judgment normal.       Results for orders placed or performed in visit on 03/15/20  Microalbumin / creatinine urine ratio  Result Value Ref Range   Microalb, Ur 0.8 0.0 - 1.9 mg/dL   Creatinine,U 56.3 mg/dL   Microalb Creat Ratio 1.4 0.0 - 30.0 mg/g  Comprehensive metabolic panel  Result Value Ref Range   Sodium 141 135 - 145 mEq/L   Potassium 4.0 3.5 - 5.1 mEq/L   Chloride 106 96 - 112 mEq/L   CO2 27 19 - 32 mEq/L   Glucose, Bld 108 (H) 70 -  99 mg/dL   BUN 12 6 - 23 mg/dL   Creatinine, Ser 0.88 0.40 - 1.20 mg/dL   Total Bilirubin 0.5 0.2 - 1.2 mg/dL   Alkaline Phosphatase 62 39 - 117 U/L   AST 16 0 - 37 U/L   ALT 14 0 - 35 U/L   Total Protein 7.3 6.0 - 8.3 g/dL   Albumin 4.0 3.5 - 5.2 g/dL   GFR 62.47 >60.00 mL/min   Calcium 9.2 8.4 - 10.5 mg/dL    Lipid panel  Result Value Ref Range   Cholesterol 201 (H) 0 - 200 mg/dL   Triglycerides 100.0 0 - 149 mg/dL   HDL 67.30 >39.00 mg/dL   VLDL 20.0 0.0 - 40.0 mg/dL   LDL Cholesterol 113 (H) 0 - 99 mg/dL   Total CHOL/HDL Ratio 3    NonHDL 133.39    Assessment & Plan:  This visit occurred during the SARS-CoV-2 public health emergency.  Safety protocols were in place, including screening questions prior to the visit, additional usage of staff PPE, and extensive cleaning of exam room while observing appropriate contact time as indicated for disinfecting solutions.   Problem List Items Addressed This Visit    Statin myopathy    Myalgias to crestor, declines further statins.       Severe obesity (BMI 35.0-39.9) with comorbidity ([place])    Continue to encourage healthy diet and lifestyle for goal sustainable weight loss.       Osteopenia    Due for rpt DEXA - will order for same day as her mammo in December.      Relevant Orders   DG Bone Density   Hyperlipidemia    Chronic, stable on zetia. Reviewed diet choices to improve LDL levels. The 10-year ASCVD risk score Mikey Bussing DC Brooke Bonito., et al., 2013) is: 33.6%   Values used to calculate the score:     Age: 57 years     Sex: Female     Is Non-Hispanic African American: No     Diabetic: No     Tobacco smoker: No     Systolic Blood Pressure: 604 mmHg     Is BP treated: Yes     HDL Cholesterol: 67.3 mg/dL     Total Cholesterol: 201 mg/dL       Relevant Medications   ezetimibe (ZETIA) 10 MG tablet   Health maintenance examination - Primary    Preventative protocols reviewed and updated unless pt declined. Discussed healthy diet and lifestyle.       GERD (gastroesophageal reflux disease)    Now only on pepcid PRN.       Ex-smoker    Continues lung cancer CT screening program.       Essential hypertension    Chronic, deteriorated in office today however reports home readings are better controlled. Advised continue monitoring with  arm cuff, update me with readings to consider titration of medication.       Relevant Medications   ezetimibe (ZETIA) 10 MG tablet   Coronary artery calcification seen on CAT scan    Sees cardiology regularly ([place])      Relevant Medications   ezetimibe (ZETIA) 10 MG tablet   Closed wedge compression fracture of L1 vertebra (HCC)    Chronic at L1 on prior imaging - would be defining of osteoporosis dx although latest DEXA only in osteopenia range - will update DEXA, consider medication.       Centrilobular emphysema (HCC)    Continues symbicort, spiriva, albuterol  PRN.  It seems symbicort is no longer on fomulary - will price out once daily breo.       Relevant Medications   Fexofenadine HCl (ALLEGRA PO)   tiotropium (SPIRIVA HANDIHALER) 18 MCG inhalation capsule   fluticasone furoate-vilanterol (BREO ELLIPTA) 200-25 MCG/INH AEPB   Carotid stenosis, asymptomatic, bilateral    Stronger L bruit - recommend updating carotid ultrasound - will order.       Relevant Medications   ezetimibe (ZETIA) 10 MG tablet   Other Relevant Orders   VAS US CAROTID   Aortic valve sclerosis    Murmur persists.       Relevant Medications   ezetimibe (ZETIA) 10 MG tablet   Advanced care planning/counseling discussion    Advanced directive:Again reviewed with patient - son is POA. Has also discussed with local cousin. Does not want to complete advanced directive. Encouraged she talk about wishes with son.        Other Visit Diagnoses    Need for influenza vaccination       Relevant Orders   Flu Vaccine QUAD High Dose(Fluad) (Completed)       Meds ordered this encounter  Medications  . tiotropium (SPIRIVA HANDIHALER) 18 MCG inhalation capsule    Sig: Place 1 capsule (18 mcg total) into inhaler and inhale daily.    Dispense:  30 capsule    Refill:  11  . ezetimibe (ZETIA) 10 MG tablet    Sig: Take 1 tablet (10 mg total) by mouth daily.    Dispense:  90 tablet    Refill:  3  .  DISCONTD: fluticasone furoate-vilanterol (BREO ELLIPTA) 200-25 MCG/INH AEPB    Sig: Inhale 1 puff into the lungs daily.    Dispense:  30 each    Refill:  11    To replace symbicort due to insurance formulary  . fluticasone furoate-vilanterol (BREO ELLIPTA) 200-25 MCG/INH AEPB    Sig: Inhale 1 puff into the lungs daily.    Dispense:  30 each    Refill:  11    To replace symbicort due to insurance formulary   Orders Placed This Encounter  Procedures  . DG Bone Density    Standing Status:   Future    Standing Expiration Date:   03/23/2021    Scheduling Instructions:     Please order for same day as mammogram 05/2020    Order Specific Question:   Reason for Exam (SYMPTOM  OR DIAGNOSIS REQUIRED)    Answer:   f/u osteopenia    Order Specific Question:   Preferred imaging location?    Answer:   External  . Flu Vaccine QUAD High Dose(Fluad)    Patient instructions: Flu shot today  We will request [facility].  We will order bone density scan - to try and schedule on same day as upcoming mammogram.  Blood pressure is staying too high! Continue monitoring at home. Let me know if consistently >150/90 to increase blood pressure medicine.  Return as needed or in 6 months for blood pressure follow up.   Follow up plan: Return in about 6 months (around 09/19/2020), or if symptoms worsen or fail to improve, for follow up visit.  Ria Bush, MD

## 2020-03-23 NOTE — Assessment & Plan Note (Signed)
Continue to encourage healthy diet and lifestyle for goal sustainable weight loss.

## 2020-03-23 NOTE — Assessment & Plan Note (Signed)
Continues symbicort, spiriva, albuterol PRN.  It seems symbicort is no longer on fomulary - will price out once daily breo.

## 2020-03-23 NOTE — Assessment & Plan Note (Signed)
Due for rpt DEXA - will order for same day as her mammo in December.

## 2020-03-23 NOTE — Assessment & Plan Note (Signed)
Chronic, deteriorated in office today however reports home readings are better controlled. Advised continue monitoring with arm cuff, update me with readings to consider titration of medication.

## 2020-03-23 NOTE — Assessment & Plan Note (Signed)
Myalgias to crestor, declines further statins.

## 2020-03-23 NOTE — Assessment & Plan Note (Signed)
Chronic, stable on zetia. Reviewed diet choices to improve LDL levels. The 10-year ASCVD risk score Mikey Bussing DC Brooke Bonito., et al., 2013) is: 33.6%   Values used to calculate the score:     Age: 74 years     Sex: Female     Is Non-Hispanic African American: No     Diabetic: No     Tobacco smoker: No     Systolic Blood Pressure: 536 mmHg     Is BP treated: Yes     HDL Cholesterol: 67.3 mg/dL     Total Cholesterol: 201 mg/dL

## 2020-03-23 NOTE — Assessment & Plan Note (Addendum)
Stronger L bruit - recommend updating carotid ultrasound - will order.

## 2020-03-23 NOTE — Assessment & Plan Note (Signed)
Chronic at L1 on prior imaging - would be defining of osteoporosis dx although latest DEXA only in osteopenia range - will update DEXA, consider medication.

## 2020-03-23 NOTE — Assessment & Plan Note (Signed)
Continues lung cancer CT screening program.

## 2020-03-23 NOTE — Assessment & Plan Note (Signed)
Sees cardiology regularly Monica Wise)

## 2020-03-23 NOTE — Assessment & Plan Note (Signed)
Murmur persists.

## 2020-03-23 NOTE — Assessment & Plan Note (Signed)
Advanced directive:Again reviewed with patient - son is POA. Has also discussed with local cousin. Does not want to complete advanced directive. Encouraged she talk about wishes with son.

## 2020-03-23 NOTE — Assessment & Plan Note (Signed)
Now only on pepcid PRN.

## 2020-03-30 ENCOUNTER — Other Ambulatory Visit: Payer: Self-pay | Admitting: General Surgery

## 2020-03-30 DIAGNOSIS — Z86010 Personal history of colonic polyps: Secondary | ICD-10-CM | POA: Diagnosis not present

## 2020-03-30 NOTE — Progress Notes (Signed)
Subjective:     Patient ID: Monica Wise is a 74 y.o. female.  HPI  The following portions of the patient's history were reviewed and updated as appropriate.  This an established patient is here today for: office visit. The patient is here today for a colonoscopy discussion. The patient reports she has at least one bowel movement per day. Patient denies rectal bleeding and mucus.        Chief Complaint  Patient presents with  . Colonoscopy    discussion     BP 168/78   Pulse 79   Temp 36.1 C (97 F)   Ht 161.3 cm (5' 3.5")   Wt 99.8 kg (220 lb)   SpO2 95%   BMI 38.36 kg/m       Past Medical History:  Diagnosis Date  . Acute bronchitis   . Adrenal adenoma 2018  . Aortic valve sclerosis   . Asthma   . Benign essential HTN   . Breast cancer (CMS-HCC) 1995   left  . Breast lump 2012  . Bruises easily   . Cardiac murmur   . Centriacinar emphysema (CMS-HCC) 2018  . Chronic diarrhea of unknown origin, unspecified   . COPD (chronic obstructive pulmonary disease) (CMS-HCC)   . Fatigue   . GERD (gastroesophageal reflux disease)   . Heart disease 2018  . History of chickenpox   . History of measles   . Hyperlipidemia, mixed   . IBS (irritable bowel syndrome)   . Joint pain   . Low back pain   . Obesity   . Osteopenia 2019  . Reactive airway disease   . Reflux esophagitis   . Seasonal allergies   . Tobacco abuse           Past Surgical History:  Procedure Laterality Date  . cardiovascular stress test  12/2004  . COLONOSCOPY  2010  . EGD  2010  . [place] BIOPSY BREAST  2012  . knee surgery  Bilateral 2010, 2014   left 01-2013, right 08-2008  . MASTECTOMY PARTIAL / LUMPECTOMY  1995              OB History    Gravida  1   Para  1   Term      Preterm      AB      Living        SAB      TAB      Ectopic      Molar      Multiple      Live Births          Obstetric Comments  Age at first  period 46 Age of first pregnancy 28 Age menopause started 62         Social History          Socioeconomic History  . Marital status: Single    Spouse name: Not on file  . Number of children: Not on file  . Years of education: Not on file  . Highest education level: Not on file  Occupational History  . Not on file  Tobacco Use  . Smoking status: Former Smoker    Packs/day: 1.00    Years: 46.00    Pack years: 46.00    Quit date: 05/31/2008    Years since quitting: 11.8  . Smokeless tobacco: Never Used  Substance and Sexual Activity  . Alcohol use: Yes    Alcohol/week: 0.0 standard drinks  . Drug  use: No  . Sexual activity: Not on file  Other Topics Concern  . Not on file  Social History Narrative  . Not on file   Social Determinants of Health      Financial Resource Strain:   . Difficulty of Paying Living Expenses:   Food Insecurity:   . Worried About Charity fundraiser in the Last Year:   . Arboriculturist in the Last Year:   Transportation Needs:   . Film/video editor (Medical):   Marland Kitchen Lack of Transportation (Non-Medical):            Allergies  Allergen Reactions  . Altace [Ramipril] Unknown  . Benicar [Olmesartan] Unknown  . Codeine Phosphate Unknown  . Demerol [Meperidine] Unknown  . Statins-Hmg-Coa Reductase Inhibitors Unknown  . Sulfa (Sulfonamide Antibiotics) Unknown    Current Medications        Current Outpatient Medications  Medication Sig Dispense Refill  . albuterol 90 mcg/actuation inhaler Inhale 2 inhalations into the lungs every 6 (six) hours as needed for Wheezing.    . APPLE CIDER VINEGAR ORAL Take 1,200 mg by mouth 2 (two) times daily    . b complex vitamins tablet Take by mouth.    . budesonide-formoterol (SYMBICORT) 80-4.5 mcg/actuation inhaler Inhale 2 inhalations into the lungs 2 (two) times daily.    . cholecalciferol (VITAMIN D3) 2,000 unit capsule Take 2,000 Units by mouth once daily.     Marland Kitchen ezetimibe (ZETIA) 10 mg tablet Take 10 mg by mouth once daily.    . famotidine (PEPCID) 20 MG tablet Take by mouth once daily as needed       . fexofenadine (ALLEGRA ALLERGY) 180 MG tablet Take 180 mg by mouth as needed.    . FUROsemide (LASIX) 20 MG tablet Take by mouth once daily as needed       . HYDROcodone-acetaminophen (NORCO) 5-325 mg tablet Take by mouth.    . montelukast (SINGULAIR) 10 mg tablet Take 10 mg by mouth nightly.    . multivitamin tablet Take 1 tablet by mouth once daily.    Marland Kitchen omeprazole (PRILOSEC) 20 MG DR capsule Take by mouth.    . potassium chloride (KLOR-CON) 10 MEQ ER tablet Take by mouth.    . telmisartan (MICARDIS) 20 MG tablet Take 1 tablet (20 mg total) by mouth once daily 90 tablet 3  . tiotropium (SPIRIVA) 18 mcg inhalation capsule Place 18 mcg into inhaler and inhale once daily.     No current facility-administered medications for this visit.           Family History  Problem Relation Age of Onset  . Heart failure Mother   . Diverticulitis Mother   . Myocardial Infarction (Heart attack) Father   . Heart disease Father   . Sudden death (unexpected death due to unknown cause) Brother   . Bone cancer Maternal Aunt         Review of Systems  Constitutional: Negative for chills and fever.  Respiratory: Negative for cough.        Objective:   Physical Exam Constitutional:      Appearance: Normal appearance.  Cardiovascular:     Rate and Rhythm: Normal rate and regular rhythm.     Pulses: Normal pulses.     Heart sounds: Murmur heard.  Systolic murmur is present with a grade of 2/6.      Comments: Unchanged from past exams.  Clinically asymptomatic. Pulmonary:     Effort: Pulmonary effort  is normal.     Breath sounds: Normal breath sounds.  Musculoskeletal:     Cervical back: Neck supple.  Skin:    General: Skin is warm and dry.  Neurological:     Mental Status: She is alert and oriented to person,  place, and time.  Psychiatric:        Mood and Affect: Mood normal.        Behavior: Behavior normal.     Labs and Radiology:   August 03, 2008 upper endoscopy and colonoscopy images were reviewed.  EGD was normal.  Colonoscopy showed multiple 5-10 mm polyps within the rectum.  Removed.  No pathologic record in the hospital database.  I suspect these were hyperplastic.    Assessment:     Healthy woman who is a candidate for colon screening.  No indication for repeat EGD.    Plan:     Options for management reviewed: 1) Cologuard versus 2) colonoscopy.  Pros and cons of each reviewed.  Risks associated with colonoscopy discussed.  Patient is in excellent health and is felt to be a candidate for colonoscopy which is her choice.    Entered by Ledell Noss, CMA, acting as a scribe for Dr. Hervey Ard, MD.   The documentation recorded by the scribe accurately reflects the service I personally performed and the decisions made by me.   Robert Bellow, MD FACS

## 2020-03-31 HISTORY — PX: COLONOSCOPY: SHX174

## 2020-04-15 ENCOUNTER — Other Ambulatory Visit: Payer: Self-pay | Admitting: Family Medicine

## 2020-04-24 ENCOUNTER — Other Ambulatory Visit
Admission: RE | Admit: 2020-04-24 | Discharge: 2020-04-24 | Disposition: A | Discharge status: Home / Self Care | Payer: Medicare Other | Source: Ambulatory Visit | Attending: General Surgery | Admitting: General Surgery

## 2020-04-24 ENCOUNTER — Other Ambulatory Visit: Payer: Self-pay

## 2020-04-24 DIAGNOSIS — Z01812 Encounter for preprocedural laboratory examination: Secondary | ICD-10-CM | POA: Diagnosis not present

## 2020-04-24 DIAGNOSIS — Z20822 Contact with and (suspected) exposure to covid-19: Secondary | ICD-10-CM | POA: Diagnosis not present

## 2020-04-24 LAB — SARS CORONAVIRUS 2 (TAT 6-24 HRS): SARS Coronavirus 2: NEGATIVE

## 2020-04-25 ENCOUNTER — Encounter: Payer: Self-pay | Admitting: General Surgery

## 2020-04-26 ENCOUNTER — Encounter: Payer: Self-pay | Admitting: General Surgery

## 2020-04-26 ENCOUNTER — Ambulatory Visit: Payer: Medicare Other | Admitting: Certified Registered Nurse Anesthetist

## 2020-04-26 ENCOUNTER — Ambulatory Visit
Admission: RE | Admit: 2020-04-26 | Discharge: 2020-04-26 | Disposition: A | Discharge status: Home / Self Care | Payer: Medicare Other | Attending: General Surgery | Admitting: General Surgery

## 2020-04-26 ENCOUNTER — Other Ambulatory Visit: Payer: Self-pay

## 2020-04-26 ENCOUNTER — Encounter
Admission: RE | Disposition: A | Discharge status: Home / Self Care | Payer: Self-pay | Source: Home / Self Care | Attending: General Surgery

## 2020-04-26 DIAGNOSIS — R233 Spontaneous ecchymoses: Secondary | ICD-10-CM | POA: Diagnosis not present

## 2020-04-26 DIAGNOSIS — Z1211 Encounter for screening for malignant neoplasm of colon: Secondary | ICD-10-CM | POA: Diagnosis not present

## 2020-04-26 DIAGNOSIS — I10 Essential (primary) hypertension: Secondary | ICD-10-CM | POA: Diagnosis not present

## 2020-04-26 DIAGNOSIS — J432 Centrilobular emphysema: Secondary | ICD-10-CM | POA: Diagnosis not present

## 2020-04-26 DIAGNOSIS — Z86010 Personal history of colonic polyps: Secondary | ICD-10-CM | POA: Diagnosis not present

## 2020-04-26 DIAGNOSIS — E785 Hyperlipidemia, unspecified: Secondary | ICD-10-CM | POA: Diagnosis not present

## 2020-04-26 HISTORY — PX: COLONOSCOPY WITH PROPOFOL: SHX5780

## 2020-04-26 SURGERY — COLONOSCOPY WITH PROPOFOL
Anesthesia: General

## 2020-04-26 MED ORDER — PROPOFOL 10 MG/ML IV BOLUS
INTRAVENOUS | Status: DC | PRN
  Administered 2020-04-26: 70 mg via INTRAVENOUS

## 2020-04-26 MED ORDER — PROPOFOL 500 MG/50ML IV EMUL
INTRAVENOUS | Status: AC
  Filled 2020-04-26: qty 50

## 2020-04-26 MED ORDER — SODIUM CHLORIDE 0.9 % IV SOLN: INTRAVENOUS | Status: DC

## 2020-04-26 MED ORDER — PROPOFOL 500 MG/50ML IV EMUL
INTRAVENOUS | Status: DC | PRN
  Administered 2020-04-26: 150 ug/kg/min via INTRAVENOUS

## 2020-04-26 MED ORDER — LIDOCAINE HCL (CARDIAC) PF 100 MG/5ML IV SOSY
PREFILLED_SYRINGE | INTRAVENOUS | Status: DC | PRN
  Administered 2020-04-26: 60 mg via INTRAVENOUS

## 2020-04-26 MED ORDER — LIDOCAINE HCL (PF) 2 % IJ SOLN
INTRAMUSCULAR | Status: AC
  Filled 2020-04-26: qty 5

## 2020-04-26 NOTE — Anesthesia Postprocedure Evaluation (Signed)
Anesthesia Post Note  Patient: Monica Wise  Procedure(s) Performed: COLONOSCOPY WITH PROPOFOL (N/A )  Patient location during evaluation: Endoscopy Anesthesia Type: General Level of consciousness: awake and alert Pain management: pain level controlled Vital Signs Assessment: post-procedure vital signs reviewed and stable Respiratory status: spontaneous breathing, nonlabored ventilation, respiratory function stable and patient connected to nasal cannula oxygen Cardiovascular status: blood pressure returned to baseline and stable Postop Assessment: no apparent nausea or vomiting Anesthetic complications: no   No complications documented.   Last Vitals:  Vitals:   04/26/20 0942 04/26/20 0948  BP: (!) 196/80   Pulse:    Resp:  18  Temp:    SpO2:      Last Pain:  Vitals:   04/26/20 0948  PainSc: 0-No pain                 Arita Miss

## 2020-04-26 NOTE — Transfer of Care (Signed)
Immediate Anesthesia Transfer of Care Note  Patient: Monica Wise  Procedure(s) Performed: COLONOSCOPY WITH PROPOFOL (N/A )  Patient Location: PACU  Anesthesia Type:General  Level of Consciousness: drowsy  Airway & Oxygen Therapy: Patient Spontanous Breathing  Post-op Assessment: Report given to RN and Post -op Vital signs reviewed and stable  Post vital signs: Reviewed and stable  Last Vitals:  Vitals Value Taken Time  BP 163/75 04/26/20 0923  Temp    Pulse 79 04/26/20 0924  Resp 22 04/26/20 0924  SpO2 98 % 04/26/20 0924  Vitals shown include unvalidated device data.  Last Pain:  Vitals:   04/26/20 0922  PainSc: 0-No pain         Complications: No complications documented.

## 2020-04-26 NOTE — Op Note (Addendum)
[facility] Patient Name: Monica Wise Procedure Date: 04/26/2020 8:53 AM MRN: [id number] Account #: [id number] Date of Birth: 1946-04-25 Admit Type: Outpatient Age: 74 Room: [facility] ENDO ROOM 1 Gender: Female Note Status: Supervisor Override Procedure:             Colonoscopy Indications:           Personal history of colonic polyps Providers:             Robert Bellow, MD Referring MD:          Ria Bush (Referring MD) Medicines:             Monitored Anesthesia Care Complications:         No immediate complications. Procedure:             Pre-Anesthesia Assessment:                        - Prior to the procedure, a History and Physical was                         performed, and patient medications, allergies and                         sensitivities were reviewed. The patient's tolerance                         of previous anesthesia was reviewed.                        - The risks and benefits of the procedure and the                         sedation options and risks were discussed with the                         patient. All questions were answered and informed                         consent was obtained.                        After obtaining informed consent, the colonoscope was                         passed under direct vision. Throughout the procedure,                         the patient's blood pressure, pulse, and oxygen                         saturations were monitored continuously. The was                         introduced through the anus and advanced to the the                         cecum, identified by appendiceal orifice and ileocecal                         valve.  The colonoscopy was somewhat difficult due to                         significant looping. Successful completion of the                         procedure was aided by using manual pressure. The                         patient tolerated the procedure well.  The quality of                         the bowel preparation was excellent. Findings:      A few petechiae were found in the rectum.      The retroflexed view of the distal rectum and anal verge was normal and       showed no anal or rectal abnormalities. Impression:            - Petechia(e) in the rectum.                        - The distal rectum and anal verge are normal on                         retroflexion view.                        - No specimens collected. Recommendation:        - Discharge patient to home. Procedure Code(s):     --- Professional ---                        [phone], Colonoscopy, flexible; diagnostic, including                         collection of specimen(s) by brushing or washing, when                         performed (separate procedure) Diagnosis Code(s):     --- Professional ---                        Z12.11, Encounter for screening for malignant neoplasm                         of colon                        K63.89, Other specified diseases of intestine CPT copyright 2019 American Medical Association. All rights reserved. The codes documented in this report are preliminary and upon coder review may  be revised to meet current compliance requirements. Robert Bellow, MD 04/26/2020 9:20:13 AM This report has been signed electronically. Number of Addenda: 0 Note Initiated On: 04/26/2020 8:53 AM Scope Withdrawal Time: 0 hours 9 minutes 10 seconds  Total Procedure Duration: 0 hours 15 minutes 35 seconds       [facility]

## 2020-04-26 NOTE — H&P (Signed)
Monica Wise [id number] 1946-06-05     HPI:  Healthy 74 year old for colon screening. Reported fecal incontinence with Dulcolax as well as Miralax portions of the prep.  No nausea reported.   Medications Prior to Admission  Medication Sig Dispense Refill Last Dose  . albuterol (PROVENTIL HFA;VENTOLIN HFA) 108 (90 Base) MCG/ACT inhaler Inhale 1-2 puffs into the lungs every 6 (six) hours as needed for wheezing or shortness of breath. 18 g 3 Past Week at Unknown time  . Apple Cid Vn-Grn Tea-Bit Or-Cr (APPLE CIDER VINEGAR PLUS) TABS Take 2 tablets by mouth 3 (three) times daily before meals.   Past Week at Unknown time  . aspirin-acetaminophen-caffeine (EXCEDRIN MIGRAINE) 250-250-65 MG tablet Take 1 tablet by mouth every 6 (six) hours as needed for headache.   Past Week at Unknown time  . azelastine (ASTELIN) 0.1 % nasal spray Place 1 spray into both nostrils 2 (two) times daily. Use in each nostril as directed   Past Week at Unknown time  . b complex vitamins tablet Take 1 tablet by mouth as needed.   Past Week at Unknown time  . budesonide-formoterol (SYMBICORT) 80-4.5 MCG/ACT inhaler Inhale 2 puffs into the lungs 2 (two) times daily.   04/26/2020 at [place]  . Cholecalciferol (VITAMIN D) 2000 units CAPS Take 1 capsule by mouth daily.   Past Week at Unknown time  . ezetimibe (ZETIA) 10 MG tablet Take 1 tablet (10 mg total) by mouth daily. 90 tablet 3 Past Week at Unknown time  . famotidine (PEPCID) 20 MG tablet Take 20 mg by mouth as needed.    Past Month at Unknown time  . Fexofenadine HCl (ALLEGRA PO) Take by mouth daily.   Past Week at Unknown time  . fluticasone (FLONASE) 50 MCG/ACT nasal spray Place 1 spray into both nostrils 2 (two) times daily. 16 g 0 Past Week at Unknown time  . fluticasone furoate-vilanterol (BREO ELLIPTA) 200-25 MCG/INH AEPB Inhale 1 puff into the lungs daily. 30 each 11 Past Week at Unknown time  . furosemide (LASIX) 20 MG tablet TAKE 1 TABLET (20 MG TOTAL) BY MOUTH DAILY AS  NEEDED FOR FLUID. 90 tablet 0 Past Month at Unknown time  . HYDROcodone-acetaminophen (NORCO/VICODIN) 5-325 MG tablet Take 1 tablet by mouth every 6 (six) hours as needed for moderate pain. 20 tablet 0 Past Month at Unknown time  . Ibuprofen-diphenhydrAMINE HCl (ADVIL PM) 200-25 MG CAPS Take 1 tablet by mouth at bedtime as needed. 28 capsule  04/25/2020 at 0800  . Loperamide HCl (ANTI-DIARRHEAL PO) Take by mouth.   Past Week at Unknown time  . Melatonin 10 MG TABS Take by mouth as needed.   Past Week at Unknown time  . Melatonin 5 MG TABS Take by mouth as needed.   Past Week at Unknown time  . mometasone (ELOCON) 0.1 % cream Apply 1 application topically daily. (Patient taking differently: Apply 1 application topically as needed. ) 45 g 0 Past Month at Unknown time  . montelukast (SINGULAIR) 10 MG tablet TAKE 1 TABLET BY MOUTH EVERY DAY IN THE EVENING 90 tablet 1 Past Week at Unknown time  . Multiple Vitamin (MULTIVITAMIN) tablet Take 1 tablet by mouth daily.   Past Week at Unknown time  . omeprazole (PRILOSEC) 20 MG capsule Take 20 mg by mouth daily.   Past Week at Unknown time  . potassium chloride (K-DUR) 10 MEQ tablet Take 1 tablet (10 mEq total) by mouth daily as needed. With lasix 30  tablet 2 Past Week at Unknown time  . SPIRIVA HANDIHALER 18 MCG inhalation capsule INHALE 1 CAPSULE VIA HANDIHALER ONCE DAILY AT THE SAME TIME EVERY DAY 30 capsule 11 04/26/2020 at 0645  . telmisartan (MICARDIS) 20 MG tablet Take 20 mg by mouth daily.   04/26/2020 at [place]  . TURMERIC PO Take 1,000 mg by mouth daily.   Past Week at Unknown time  . benzonatate (TESSALON) 100 MG capsule Take 1 capsule (100 mg total) by mouth 3 (three) times daily as needed for cough. 30 capsule 0   . CETIRIZINE HCL PO Take 1 tablet by mouth as needed. (Patient not taking: Reported on 03/22/2020)   Not Taking at Unknown time   Allergies  Allergen Reactions  . Altace [Ramipril]     unknown  . Benicar [Olmesartan]     unknown  .  Codeine Nausea And Vomiting  . Demerol [Meperidine] Nausea And Vomiting  . Statins Other (See Comments)    Myalgias to crestor, refused further statins  . Sulfa Antibiotics     Unknown reaction per patient   Past Medical History:  Diagnosis Date  . Adrenal adenoma, right 10/18/2016   Incidental by CT 09/2016  . Aortic valve sclerosis 2014   withoout stenosis, mild aortic insufficiency - Dr Ubaldo Glassing  . Asthma   . CAD (coronary artery disease) 10/18/2016   By CT 09/2016  . Centrilobular emphysema ([place]) 10/18/2016   By CT 09/2016  . Chronic diarrhea 2011   worse in am, ?IBS  . COPD (chronic obstructive pulmonary disease) ([place]) 2005   chronic bronchitis - spirometry FVC 70%, FEV1 72%, abnormal loop volume (unknown date)  . Essential hypertension   . Ex-smoker   . GERD (gastroesophageal reflux disease)   . Heart murmur   . History of chicken pox   . History of measles   . Hyperlipidemia   . IBS (irritable bowel syndrome)   . Lumbago   . Lump or mass in breast 2012   right breast US guided finesse 1 o'clock  . Obesity   . Osteoarthritis    knees s/p surgeries  . Osteopenia 09/2014   mild by DEXA (-1.2 femur neck)  . Perennial allergic rhinitis    Past Surgical History:  Procedure Laterality Date  . BREAST BIOPSY Right 2012  . BREAST LUMPECTOMY Left 1995   benign mass  . CARDIOVASCULAR STRESS TEST  12/2004   nl myoview, preserved LV fxn with EF 71% without reversible ischemia ([place])  . COLONOSCOPY  2010   hyperplastic polyps identified. rpt 10 yrs ([facility])  . DEXA  09/2014   T -1.2 femur neck  . ESOPHAGOGASTRODUODENOSCOPY  2010   Vi [facility]  . KNEE SURGERY Right 2010   arthroscopic  . KNEE SURGERY Left 2014   Social History   Socioeconomic History  . Marital status: Divorced    Spouse name: Not on file  . Number of children: Not on file  . Years of education: Not on file  . Highest education level: Not on file  Occupational History  . Not on file  Tobacco Use  . Smoking  status: Former Smoker    Packs/day: 1.00    Years: 30.00    Pack years: 30.00    Types: Cigarettes    Quit date: 05/31/2008    Years since quitting: 11.9  . Smokeless tobacco: Never Used  Vaping Use  . Vaping Use: Never used  Substance and Sexual Activity  . Alcohol use: Yes  Alcohol/week: 0.0 standard drinks    Comment: occasionally wine  . Drug use: No  . Sexual activity: Not Currently  Other Topics Concern  . Not on file  Social History Narrative   Lives alone, 2 feral cats   Occupation: retired, worked for [employer]   Edu: [employer]   Activity: works in yard, unable to walk 2/2 knee pain   Diet: good water, fruits/vegetables daily   Social Determinants of Radio broadcast assistant Strain: Low Risk   . Difficulty of Paying Living Expenses: Not hard at all  Food Insecurity: No Food Insecurity  . Worried About Charity fundraiser in the Last Year: Never true  . Ran Out of Food in the Last Year: Never true  Transportation Needs: No Transportation Needs  . Lack of Transportation (Medical): No  . Lack of Transportation (Non-Medical): No  Physical Activity: Inactive  . Days of Exercise per Week: 0 days  . Minutes of Exercise per Session: 0 min  Stress: No Stress Concern Present  . Feeling of Stress : Not at all  Social Connections:   . Frequency of Communication with Friends and Family: Not on file  . Frequency of Social Gatherings with Friends and Family: Not on file  . Attends Religious Services: Not on file  . Active Member of Clubs or Organizations: Not on file  . Attends Archivist Meetings: Not on file  . Marital Status: Not on file  Intimate Partner Violence: Not At Risk  . Fear of Current or Ex-Partner: No  . Emotionally Abused: No  . Physically Abused: No  . Sexually Abused: No   Social History   Social History Narrative   Lives alone, 2 feral cats   Occupation: retired, worked for [employer]   Edu: TEFL teacher   Activity: works in yard,  unable to walk 2/2 knee pain   Diet: good water, fruits/vegetables daily     ROS: Negative.     PE: HEENT: Negative. Lungs: Clear. Cardio: RR.  Assessment/Plan:  Proceed with planned colonoscopy.   Forest Gleason Trinity Hyland 04/26/2020

## 2020-04-26 NOTE — Anesthesia Preprocedure Evaluation (Signed)
Anesthesia Evaluation  Patient identified by MRN, date of birth, ID band Patient awake    Reviewed: Allergy & Precautions, NPO status , Patient's Chart, lab work & pertinent test results  History of Anesthesia Complications Negative for: history of anesthetic complications  Airway Mallampati: III  TM Distance: >3 FB Neck ROM: Full    Dental  (+) Implants   Pulmonary COPD,  COPD inhaler, former smoker,    breath sounds clear to auscultation- rhonchi (-) wheezing      Cardiovascular hypertension, Pt. on medications (-) CAD, (-) Past MI, (-) Cardiac Stents and (-) CABG  Rhythm:Regular Rate:Normal - Systolic murmurs and - Diastolic murmurs Echo 07/27/49: NORMAL LEFT VENTRICULAR SYSTOLIC FUNCTION  WITH MILD LVH  NORMAL RIGHT VENTRICULAR SYSTOLIC FUNCTION  MILD VALVULAR REGURGITATION (See above)  MILD VALVULAR STENOSIS (See above)  Mobility: PARTIALLY MOBILE  Morphology: MILDLY THICKENED  AVS: MILD AS  Aortic: TRIVIAL AR  Mitral: TRIVIAL MR  Tricuspid: TRIVIAL TR  Closest EF: >55% (Estimated)     Neuro/Psych neg Seizures negative neurological ROS  negative psych ROS   GI/Hepatic Neg liver ROS, GERD  ,  Endo/Other  negative endocrine ROSneg diabetes  Renal/GU negative Renal ROS     Musculoskeletal  (+) Arthritis ,   Abdominal (+) + obese,   Peds  Hematology negative hematology ROS (+)   Anesthesia Other Findings Past Medical History: 10/18/2016: Adrenal adenoma, right     Comment:  Incidental by CT 09/2016 2014: Aortic valve sclerosis     Comment:  withoout stenosis, mild aortic insufficiency - Dr Ubaldo Glassing No date: Asthma 10/18/2016: CAD (coronary artery disease)     Comment:  By CT 09/2016 10/18/2016: Centrilobular emphysema ([place])     Comment:  By CT 09/2016 2011: Chronic diarrhea     Comment:  worse in am, ?IBS 2005: COPD (chronic obstructive pulmonary disease) ([facility])     Comment:  chronic bronchitis - spirometry  FVC 70%, FEV1 72%,               abnormal loop volume (unknown date) No date: Essential hypertension No date: Ex-smoker No date: GERD (gastroesophageal reflux disease) No date: Heart murmur No date: History of chicken pox No date: History of measles No date: Hyperlipidemia No date: IBS (irritable bowel syndrome) No date: Lumbago 2012: Lump or mass in breast     Comment:  right breast US guided finesse 1 o'clock No date: Obesity No date: Osteoarthritis     Comment:  knees s/p surgeries 09/2014: Osteopenia     Comment:  mild by DEXA (-1.2 femur neck) No date: Perennial allergic rhinitis   Reproductive/Obstetrics                             Anesthesia Physical Anesthesia Plan  ASA: III  Anesthesia Plan: General   Post-op Pain Management:    Induction: Intravenous  PONV Risk Score and Plan: 2 and Propofol infusion  Airway Management Planned: Natural Airway  Additional Equipment:   Intra-op Plan:   Post-operative Plan:   Informed Consent: I have reviewed the patients History and Physical, chart, labs and discussed the procedure including the risks, benefits and alternatives for the proposed anesthesia with the patient or authorized representative who has indicated his/her understanding and acceptance.     Dental advisory given  Plan Discussed with: CRNA and Anesthesiologist  Anesthesia Plan Comments:         Anesthesia Quick Evaluation

## 2020-04-27 ENCOUNTER — Encounter: Payer: Self-pay | Admitting: General Surgery

## 2020-05-01 ENCOUNTER — Ambulatory Visit (INDEPENDENT_AMBULATORY_CARE_PROVIDER_SITE_OTHER): Payer: Medicare Other

## 2020-05-01 ENCOUNTER — Other Ambulatory Visit: Payer: Self-pay

## 2020-05-01 DIAGNOSIS — I6523 Occlusion and stenosis of bilateral carotid arteries: Secondary | ICD-10-CM

## 2020-06-12 DIAGNOSIS — Z1231 Encounter for screening mammogram for malignant neoplasm of breast: Secondary | ICD-10-CM | POA: Diagnosis not present

## 2020-06-12 LAB — HM MAMMOGRAPHY

## 2020-06-20 DIAGNOSIS — Z1239 Encounter for other screening for malignant neoplasm of breast: Secondary | ICD-10-CM | POA: Diagnosis not present

## 2020-07-28 ENCOUNTER — Telehealth: Payer: Self-pay

## 2020-07-28 DIAGNOSIS — M19012 Primary osteoarthritis, left shoulder: Secondary | ICD-10-CM | POA: Diagnosis not present

## 2020-07-28 DIAGNOSIS — M25512 Pain in left shoulder: Secondary | ICD-10-CM | POA: Diagnosis not present

## 2020-07-28 DIAGNOSIS — M79602 Pain in left arm: Secondary | ICD-10-CM | POA: Diagnosis not present

## 2020-07-28 NOTE — Telephone Encounter (Signed)
Pt was sent for triage for access nurse and was advised to be seen today; no available appts at [facility]; pt fell on ice on 07/18/20; today pt is having a lot of pain in entire lt arm; upper arm hurts the worse; and pt also has pain at back of lt shoulder. Pt having difficulty moving the lt arm; pt cannot grip with left hand.No available appts at [facility] and pt is going to [facility] for eval and xrays. FYI to Dr Darnell Level.

## 2020-07-28 NOTE — Telephone Encounter (Signed)
[facility] - Client TELEPHONE ADVICE RECORD AccessNurse Patient Name: Monica Wise Gender: Female DOB: 05-10-1946 Age: 75 Y 77 M 19 D Return Phone Number: [phone] (Primary), [phone] (Secondary) Address[place] [address] Client [facility] - Client Client Site [place] Physician Ria Bush - MD Contact Type Call Who Is Calling Patient / Member / Family / Caregiver Call Type Triage / Clinical Relationship To Patient Self Return Phone Number [phone] (Secondary) Chief Complaint Hand or Wrist Injury Reason for Call Symptomatic / Request for [place] states she is calling from Dr. Danise Mina office. The patient fell on the ice on the 18th. She is loosing grip on her left hand. She can't pull up her pants and is in pain. Translation No Nurse Assessment Nurse: Jimmye Norman, RN, [place] Date/Time (Eastern Time): 07/28/2020 11:29:36 AM Confirm and document reason for call. If symptomatic, describe symptoms. ---Caller states she fell on the ice on the 18th. She is loosing grip in her left hand. She is having trouble pulling up her pants now and is in pain. Does the patient have any new or worsening symptoms? ---Yes Will a triage be completed? ---Yes Related visit to physician within the last 2 weeks? ---No Does the PT have any chronic conditions? (i.e. diabetes, asthma, this includes High risk factors for pregnancy, etc.) ---Yes List chronic conditions. ---COPD Is this a behavioral health or substance abuse call? ---No Guidelines Guideline Title Affirmed Question Affirmed Notes Nurse Date/Time (Eastern Time) Arm Injury Can't move injured arm normally (bend or straighten completely) Jimmye Norman, RN, [place] 07/28/2020 11:31:40 AM Disp. Time Eilene Ghazi Time) Disposition Final User 07/28/2020 11:38:53 AM See PCP within 24 Hours Yes Jimmye Norman, RN,  [employer] Caller Disagree/Comply Comply PLEASE NOTE: All timestamps contained within this report are represented as [place] Standard Time. CONFIDENTIALTY NOTICE: This fax transmission is intended only for the addressee. It contains information that is legally privileged, confidential or otherwise protected from use or disclosure. If you are not the intended recipient, you are strictly prohibited from reviewing, disclosing, copying using or disseminating any of this information or taking any action in reliance on or regarding this information. If you have received this fax in error, please notify us immediately by telephone so that we can arrange for its return to [place]. Phone: [phone], Toll-Free: [phone], Fax: [phone] Page: 2 of 2 Call Id: [id number] [place] Understands Yes PreDisposition InappropriateToAsk Care Advice Given Per Guideline SEE PCP WITHIN 24 HOURS: * IF OFFICE WILL BE OPEN: You need to be examined within the next 24 hours. Call your doctor (or NP/PA) when the office opens and make an appointment. Referrals REFERRED TO PCP OFFICE

## 2020-07-28 NOTE — Telephone Encounter (Signed)
Noted. Will await their eval.

## 2020-07-28 NOTE — Telephone Encounter (Signed)
See note below the access nurse note; pt was going to [facility] for eval and xray.

## 2020-08-01 ENCOUNTER — Ambulatory Visit: Payer: Medicare Other | Admitting: Primary Care

## 2020-08-16 ENCOUNTER — Encounter: Payer: Self-pay | Admitting: Family Medicine

## 2020-08-16 ENCOUNTER — Ambulatory Visit (INDEPENDENT_AMBULATORY_CARE_PROVIDER_SITE_OTHER): Payer: Medicare Other | Admitting: Family Medicine

## 2020-08-16 ENCOUNTER — Ambulatory Visit (INDEPENDENT_AMBULATORY_CARE_PROVIDER_SITE_OTHER)
Admission: RE | Admit: 2020-08-16 | Discharge: 2020-08-16 | Disposition: A | Discharge status: Home / Self Care | Payer: Medicare Other | Source: Ambulatory Visit | Attending: Family Medicine | Admitting: Family Medicine

## 2020-08-16 ENCOUNTER — Other Ambulatory Visit: Payer: Self-pay

## 2020-08-16 VITALS — BP 160/60 | HR 88 | Temp 98.2°F | Ht 63.5 in | Wt 221.5 lb

## 2020-08-16 DIAGNOSIS — R29898 Other symptoms and signs involving the musculoskeletal system: Secondary | ICD-10-CM

## 2020-08-16 DIAGNOSIS — M79602 Pain in left arm: Secondary | ICD-10-CM

## 2020-08-16 DIAGNOSIS — Z043 Encounter for examination and observation following other accident: Secondary | ICD-10-CM | POA: Diagnosis not present

## 2020-08-16 MED ORDER — PREDNISONE 20 MG PO TABS: ORAL_TABLET | ORAL | 0 refills | Status: DC

## 2020-08-16 NOTE — Progress Notes (Signed)
Monica Kozma T. Arrietty Dercole, MD, [facility]  Primary Care and Sports Medicine [facility] [place], [address]  Phone: [phone]  FAX: [phone]  MARTIZA Wise - 75 y.o. female  MRN [id number]  Date of Birth: 09-05-1945  Date: 08/16/2020  PCP: Ria Bush, MD  Referral: Ria Bush, MD  Chief Complaint  Patient presents with  . Fall    07/19/19   . Arm Pain    This visit occurred during the SARS-CoV-2 public health emergency.  Safety protocols were in place, including screening questions prior to the visit, additional usage of staff PPE, and extensive cleaning of exam room while observing appropriate contact time as indicated for disinfecting solutions.   Subjective:   Monica Wise is a 75 y.o. very pleasant female patient with Body mass index is 38.62 kg/m. who presents with the following:  DOI: 07/18/2020  This patient slipped on the ice and fell on the above date.  She fell on her left side, and at that time she did have some significant pain.  She did go to urgent care at [facility], and they did an evaluation and she had negative humerus x-rays.  The date of this encounter was July 28, 2020.  About the week after her initial injury she started to have some difficulty gripping.  She has not had any difficulty communicating, walking, facial drooping, memory difficulties, changes in regards to her site, and describes no other neurological changes aside from above.  She is able to move her shoulder completely, and she describes no significant pain in the shoulder joint or the neck.  She also describes no pain in the upper trap region or in the midportion of the trapezius or the rhomboids.  Fell on the ic, and then the following week.  Arm started to hurt and now having difficulty gripping.  Was having difficulty a week later.    L grip Lumbricals All significantly decreased strength  Review of  Systems is noted in the HPI, as appropriate   Patient Active Problem List   Diagnosis Date Noted  . Osteopenia 03/22/2020  . Statin myopathy 08/04/2019  . Pulmonary nodules 01/16/2019  . Closed wedge compression fracture of L1 vertebra ([place]) 11/06/2017  . Aortic valve sclerosis 10/10/2017  . Carotid stenosis, asymptomatic, bilateral 11/16/2016  . Personal history of tobacco use, presenting hazards to health 10/20/2016  . Coronary artery calcification seen on CAT scan 10/18/2016  . Adrenal adenoma, right 10/18/2016  . Back injury 07/02/2016  . Medicare annual wellness visit, subsequent 09/19/2014  . Advanced care planning/counseling discussion 09/19/2014  . Health maintenance examination 09/19/2014  . IBS (irritable bowel syndrome)   . Essential hypertension 06/17/2014  . Osteoarthritis   . Perennial allergic rhinitis   . Ex-smoker   . Chronic diarrhea   . Centrilobular emphysema ([place])   . Severe obesity (BMI 35.0-39.9) with comorbidity ([place])   . GERD (gastroesophageal reflux disease)   . Hyperlipidemia     Past Medical History:  Diagnosis Date  . Adrenal adenoma, right 10/18/2016   Incidental by CT 09/2016  . Aortic valve sclerosis 2014   withoout stenosis, mild aortic insufficiency - Dr Ubaldo Glassing  . Asthma   . CAD (coronary artery disease) 10/18/2016   By CT 09/2016  . Centrilobular emphysema ([place]) 10/18/2016   By CT 09/2016  . Chronic diarrhea 2011   worse in am, ?IBS  . COPD (chronic obstructive pulmonary disease) ([place]) 2005  chronic bronchitis - spirometry FVC 70%, FEV1 72%, abnormal loop volume (unknown date)  . Essential hypertension   . Ex-smoker   . GERD (gastroesophageal reflux disease)   . Heart murmur   . History of chicken pox   . History of measles   . Hyperlipidemia   . IBS (irritable bowel syndrome)   . Lumbago   . Lump or mass in breast 2012   right breast US guided finesse 1 o'clock  . Obesity   . Osteoarthritis    knees s/p surgeries  . Osteopenia  09/2014   mild by DEXA (-1.2 femur neck)  . Perennial allergic rhinitis     Past Surgical History:  Procedure Laterality Date  . BREAST BIOPSY Right 2012  . BREAST LUMPECTOMY Left 1995   benign mass  . CARDIOVASCULAR STRESS TEST  12/2004   nl myoview, preserved LV fxn with EF 71% without reversible ischemia ([place])  . COLONOSCOPY  2010   hyperplastic polyps identified. rpt 10 yrs ([place])  . COLONOSCOPY  03/2020   rectal petechiae ([place])  . COLONOSCOPY WITH PROPOFOL N/A 04/26/2020   Procedure: COLONOSCOPY WITH PROPOFOL;  Surgeon: Robert Bellow, MD;  Location: [facility] ENDOSCOPY;  Service: Endoscopy;  Laterality: N/A;  . DEXA  09/2014   T -1.2 femur neck  . ESOPHAGOGASTRODUODENOSCOPY  2010   [place]  . KNEE SURGERY Right 2010   arthroscopic  . KNEE SURGERY Left 2014    Family History  Problem Relation Age of Onset  . Diverticulitis Mother   . Heart failure Mother   . Cancer Maternal Aunt        bone, and unsure  . CAD Father 50       MI x3  . Sudden death Brother 25     Objective:   BP (!) 160/60   Pulse 88   Temp 98.2 F (36.8 C) (Temporal)   Ht 5' 3.5" (1.613 m)   Wt 221 lb 8 oz (100.5 kg)   SpO2 94%   BMI 38.62 kg/m    GEN: alert,appropriate PSYCH: Normally interactive. Cooperative during the interview.   CERVICAL SPINE EXAM Range of motion: Flexion, extension, lateral bending, and rotation: Minimal loss of motion Pain with terminal motion: Minimal Spinous Processes: NT SCM: NT Upper paracervical muscles: Minimal to no tenderness Upper traps: NT   Shoulder: Left Inspection: No muscle wasting or winging Ecchymosis/edema: neg  She does have some tenderness to palpation at the upper arm AC joint, scapula, clavicle: NT Cervical spine: NT, full ROM Spurling's: neg Abduction: full, 5/5 Flexion: full, 5/5 IR, full, lift-off: 5/5 ER at neutral: full, 5/5 AC crossover and compression: neg Neer: neg Hawkins: neg Drop Test: neg Empty Can:  neg Supraspinatus insertion: NT Bicipital groove: NT Speed's: neg Yergason's: neg Sulcus sign: neg Scapular dyskinesis: none  Her sensation is intact throughout bilateral upper extremities. She does have decreased strength approximately 3/5 in her grip and with lumbrical testing.  Globally she does have approximately 4-4+ strength throughout, and this is decreased compared to the contralateral side  Radiology:  Humerus series, AP and lateral. Indication: Pain There is no evidence for humeral fracture.  No elbow fracture is appreciated.  No fractures or dislocations.  Electronically Signed  By: Owens Loffler, MD On: 08/16/2020  2:20 PM EST   At the time of this dictation, the radiologist have not read this patient's film.  Assessment and Plan:     ICD-10-CM   1. Left hand weakness  R29.898 Ambulatory referral to  Neurology    DG Humerus Left  2. Left arm weakness  R29.898 Ambulatory referral to Neurology    DG Humerus Left  3. Decreased grip strength of left hand  R29.898 Ambulatory referral to Neurology    DG Humerus Left  4. Left arm pain  M79.602 DG Humerus Left   The patient has full range of motion at the shoulder and the neck.  No tenderness at the shoulder with all movements.  No significant tenderness in the neck or in the upper trapezius region.  Onset of symptoms is 1 week post fall.  She does not describe a brachial plexus stretch event.  I do not have a clear etiology, but I do not think that this is a sports medicine case.  With her decreased grip and functionality of the affected arm, I think that she needs further evaluation by neurology.  I appreciate their consult.  I am going to pulse her with some steroids, and hopefully this will reduce her symptoms and improve function.  Meds ordered this encounter  Medications  . predniSONE (DELTASONE) 20 MG tablet    Sig: 2 tabs po daily for 5 days, then 1 tab po daily for 5 days    Dispense:  15 tablet    Refill:   0   Medications Discontinued During This Encounter  Medication Reason  . budesonide-formoterol (SYMBICORT) 80-4.5 MCG/ACT inhaler Discontinued by provider   Orders Placed This Encounter  Procedures  . DG Humerus Left  . Ambulatory referral to Neurology    Follow-up: No follow-ups on file.  Signed,  Maud Deed. Veverly Larimer, MD   Outpatient Encounter Medications as of 08/16/2020  Medication Sig  . albuterol (PROVENTIL HFA;VENTOLIN HFA) 108 (90 Base) MCG/ACT inhaler Inhale 1-2 puffs into the lungs every 6 (six) hours as needed for wheezing or shortness of breath.  . Apple Cid Vn-Grn Tea-Bit Or-Cr (APPLE CIDER VINEGAR PLUS) TABS Take 2 tablets by mouth 3 (three) times daily before meals.  Marland Kitchen aspirin-acetaminophen-caffeine (EXCEDRIN MIGRAINE) 250-250-65 MG tablet Take 1 tablet by mouth every 6 (six) hours as needed for headache.  Marland Kitchen azelastine (ASTELIN) 0.1 % nasal spray Place 1 spray into both nostrils 2 (two) times daily. Use in each nostril as directed  . b complex vitamins tablet Take 1 tablet by mouth as needed.  . benzonatate (TESSALON) 100 MG capsule Take 1 capsule (100 mg total) by mouth 3 (three) times daily as needed for cough.  . CETIRIZINE HCL PO Take 1 tablet by mouth as needed.  . Cholecalciferol (VITAMIN D) 2000 units CAPS Take 1 capsule by mouth daily.  Marland Kitchen ezetimibe (ZETIA) 10 MG tablet Take 1 tablet (10 mg total) by mouth daily.  . famotidine (PEPCID) 20 MG tablet Take 20 mg by mouth as needed.   Marland Kitchen Fexofenadine HCl (ALLEGRA PO) Take by mouth daily.  . fluticasone (FLONASE) 50 MCG/ACT nasal spray Place 1 spray into both nostrils 2 (two) times daily.  . fluticasone furoate-vilanterol (BREO ELLIPTA) 200-25 MCG/INH AEPB Inhale 1 puff into the lungs daily.  . furosemide (LASIX) 20 MG tablet TAKE 1 TABLET (20 MG TOTAL) BY MOUTH DAILY AS NEEDED FOR FLUID.  Marland Kitchen HYDROcodone-acetaminophen (NORCO/VICODIN) 5-325 MG tablet Take 1 tablet by mouth every 6 (six) hours as needed for moderate pain.   . Ibuprofen-diphenhydrAMINE HCl (ADVIL PM) 200-25 MG CAPS Take 1 tablet by mouth at bedtime as needed.  . Loperamide HCl (ANTI-DIARRHEAL PO) Take by mouth.  . Melatonin 10 MG TABS Take by mouth as  needed.  . Melatonin 5 MG TABS Take by mouth as needed.  . metaxalone (SKELAXIN) 800 MG tablet Take by mouth.  . mometasone (ELOCON) 0.1 % cream Apply 1 application topically daily. (Patient taking differently: Apply 1 application topically as needed.)  . montelukast (SINGULAIR) 10 MG tablet TAKE 1 TABLET BY MOUTH EVERY DAY IN THE EVENING  . Multiple Vitamin (MULTIVITAMIN) tablet Take 1 tablet by mouth daily.  Marland Kitchen omeprazole (PRILOSEC) 20 MG capsule Take 20 mg by mouth daily.  . potassium chloride (K-DUR) 10 MEQ tablet Take 1 tablet (10 mEq total) by mouth daily as needed. With lasix  . predniSONE (DELTASONE) 20 MG tablet 2 tabs po daily for 5 days, then 1 tab po daily for 5 days  . SPIRIVA HANDIHALER 18 MCG inhalation capsule INHALE 1 CAPSULE VIA HANDIHALER ONCE DAILY AT THE SAME TIME EVERY DAY  . telmisartan (MICARDIS) 20 MG tablet Take 20 mg by mouth daily.  . TURMERIC PO Take 1,000 mg by mouth daily.  . [DISCONTINUED] budesonide-formoterol (SYMBICORT) 80-4.5 MCG/ACT inhaler Inhale 2 puffs into the lungs 2 (two) times daily.   No facility-administered encounter medications on file as of 08/16/2020.

## 2020-08-21 ENCOUNTER — Telehealth: Payer: Self-pay | Admitting: Family Medicine

## 2020-08-21 NOTE — Telephone Encounter (Signed)
Pt has been referred to Neurology. She states she was advised to call in if [facility] couldn't see her soon. She states they cannot see her for 2 months. She said she will not be able to wait that long and wants to know if this can be changed to [facility] to see if they can see her sooner.

## 2020-08-21 NOTE — Telephone Encounter (Signed)
Spoke with patient. Discussed that even Neurology offices in [place] have a wait of 2 months or so. I did speak with [facility] Neurology office and they will look to see if they can work patient in or at least get her scheduled for March. Patient is aware and is expecting their call. I will keep a follow up on this to make sure she is scheduled.

## 2020-08-22 NOTE — Telephone Encounter (Signed)
Left message for patient letting her know that [place] neurological office called her and left her a message to call back. Patient has all the office information that I provided to her when we spoke yesterday.

## 2020-08-24 ENCOUNTER — Ambulatory Visit: Payer: Medicare Other | Admitting: Neurology

## 2020-08-24 ENCOUNTER — Encounter: Payer: Self-pay | Admitting: Neurology

## 2020-08-24 ENCOUNTER — Telehealth: Payer: Self-pay | Admitting: Neurology

## 2020-08-24 VITALS — BP 168/70 | HR 74 | Ht 63.5 in | Wt 225.0 lb

## 2020-08-24 DIAGNOSIS — M542 Cervicalgia: Secondary | ICD-10-CM | POA: Insufficient documentation

## 2020-08-24 DIAGNOSIS — R29898 Other symptoms and signs involving the musculoskeletal system: Secondary | ICD-10-CM

## 2020-08-24 MED ORDER — GABAPENTIN 100 MG PO CAPS: 100.0000 mg | ORAL_CAPSULE | Freq: Three times a day (TID) | ORAL | 6 refills | Status: DC

## 2020-08-24 NOTE — Telephone Encounter (Signed)
[employer] medicare order sent to GI. No auth they will reach out to the patient to schedule.  

## 2020-08-24 NOTE — Progress Notes (Signed)
Chief Complaint  Patient presents with  . New Patient (Initial Visit)    Referred for further evaluation of left hand and left arm weakness. She also has cold sensations in her left hand. Reports falling on ice 07/18/20 and hit her upper left extremity. Symptoms started one week after this accident. She had x-rays done through [facility]. She is finishing up a ten day cycle of Prednisone (40mg  x 5 days then 20 x 5 days).    HISTORICAL  Monica Wise is a 75 year old female, seen in request by her primary care doctor Ria Bush for evaluation of left arm weakness, initial evaluation was on August 24, 2020.  I reviewed and summarized the referring note.PMHx HTN Chronic migraine GERD Seasonal allergery HLD Knee arthroscopic surgery. Right 2010, Left 2014.  She lives alone, still driving, was highly function, did depend on her cane for ambulating since her knee surgery, she fell on ice on July 18, 2020, landed on her left shoulder, hip, she was able to get up afterwards, has a bruise at her left lateral arm, hip  About a week later, she noticed difficulty of her left hand, difficulty with grab things, make a tight fist, difficulty putting on her socks, fastening her bra, also develop significant left lower neck, upper thoracic pain, felt cold numbing sensation of her whole left hand  She was recently treated with tapering dose of steroid, which has helped her pain significantly, but continued to have left hand weakness, could not make a tight grip, she denies involvement of her right arm, no change in her gait abnormality, no bowel and bladder incontinence, no left face involvement,   REVIEW OF SYSTEMS: Full 14 system review of systems performed and notable only for as above All other review of systems were negative.  ALLERGIES: Allergies  Allergen Reactions  . Altace [Ramipril]     unknown  . Benicar [Olmesartan]     unknown  . Codeine Nausea And Vomiting  . Demerol  [Meperidine] Nausea And Vomiting  . Statins Other (See Comments)    Myalgias to crestor, refused further statins  . Sulfa Antibiotics     Unknown reaction per patient    HOME MEDICATIONS: Current Outpatient Medications  Medication Sig Dispense Refill  . albuterol (PROVENTIL HFA;VENTOLIN HFA) 108 (90 Base) MCG/ACT inhaler Inhale 1-2 puffs into the lungs every 6 (six) hours as needed for wheezing or shortness of breath. 18 g 3  . Apple Cid Vn-Grn Tea-Bit Or-Cr (APPLE CIDER VINEGAR PLUS) TABS Take 2 tablets by mouth 3 (three) times daily before meals.    Marland Kitchen aspirin-acetaminophen-caffeine (EXCEDRIN MIGRAINE) 250-250-65 MG tablet Take 1 tablet by mouth every 6 (six) hours as needed for headache.    Marland Kitchen azelastine (ASTELIN) 0.1 % nasal spray Place 1 spray into both nostrils 2 (two) times daily. Use in each nostril as directed    . b complex vitamins tablet Take 1 tablet by mouth as needed.    . benzonatate (TESSALON) 100 MG capsule Take 1 capsule (100 mg total) by mouth 3 (three) times daily as needed for cough. 30 capsule 0  . CETIRIZINE HCL PO Take 1 tablet by mouth as needed.    . Cholecalciferol (VITAMIN D) 2000 units CAPS Take 1 capsule by mouth daily.    Marland Kitchen ezetimibe (ZETIA) 10 MG tablet Take 1 tablet (10 mg total) by mouth daily. 90 tablet 3  . famotidine (PEPCID) 20 MG tablet Take 20 mg by mouth as needed.     Marland Kitchen  Fexofenadine HCl (ALLEGRA PO) Take by mouth daily.    . fluticasone (FLONASE) 50 MCG/ACT nasal spray Place 1 spray into both nostrils 2 (two) times daily. 16 g 0  . fluticasone furoate-vilanterol (BREO ELLIPTA) 200-25 MCG/INH AEPB Inhale 1 puff into the lungs daily. 30 each 11  . furosemide (LASIX) 20 MG tablet TAKE 1 TABLET (20 MG TOTAL) BY MOUTH DAILY AS NEEDED FOR FLUID. 90 tablet 0  . HYDROcodone-acetaminophen (NORCO/VICODIN) 5-325 MG tablet Take 1 tablet by mouth every 6 (six) hours as needed for moderate pain. 20 tablet 0  . Ibuprofen-diphenhydrAMINE Cit (ADVIL PM PO) Take 1  tablet by mouth as needed (sleep). She does not use in combination with melatonin.    . Ibuprofen-diphenhydrAMINE HCl (ADVIL PM) 200-25 MG CAPS Take 1 tablet by mouth at bedtime as needed. 28 capsule   . Loperamide HCl (ANTI-DIARRHEAL PO) Take by mouth.    . MELATONIN PO Take 5-10 mg by mouth at bedtime.    . metaxalone (SKELAXIN) 800 MG tablet Take by mouth.    . mometasone (ELOCON) 0.1 % cream Apply 1 application topically daily. (Patient taking differently: Apply 1 application topically as needed.) 45 g 0  . montelukast (SINGULAIR) 10 MG tablet TAKE 1 TABLET BY MOUTH EVERY DAY IN THE EVENING 90 tablet 1  . Multiple Vitamin (MULTIVITAMIN) tablet Take 1 tablet by mouth daily.    . potassium chloride (K-DUR) 10 MEQ tablet Take 1 tablet (10 mEq total) by mouth daily as needed. With lasix 30 tablet 2  . predniSONE (DELTASONE) 20 MG tablet 2 tabs po daily for 5 days, then 1 tab po daily for 5 days 15 tablet 0  . SPIRIVA HANDIHALER 18 MCG inhalation capsule INHALE 1 CAPSULE VIA HANDIHALER ONCE DAILY AT THE SAME TIME EVERY DAY 30 capsule 11  . telmisartan (MICARDIS) 20 MG tablet Take 20 mg by mouth daily.    . TURMERIC PO Take 1,000 mg by mouth daily.     No current facility-administered medications for this visit.    PAST MEDICAL HISTORY: Past Medical History:  Diagnosis Date  . Adrenal adenoma, right 10/18/2016   Incidental by CT 09/2016  . Aortic valve sclerosis 2014   withoout stenosis, mild aortic insufficiency - Dr Ubaldo Glassing  . Asthma   . CAD (coronary artery disease) 10/18/2016   By CT 09/2016  . Centrilobular emphysema ([place]) 10/18/2016   By CT 09/2016  . Chronic diarrhea 2011   worse in am, ?IBS  . COPD (chronic obstructive pulmonary disease) ([place]) 2005   chronic bronchitis - spirometry FVC 70%, FEV1 72%, abnormal loop volume (unknown date)  . Essential hypertension   . Ex-smoker   . GERD (gastroesophageal reflux disease)   . Heart murmur   . History of chicken pox   . History of  measles   . Hyperlipidemia   . IBS (irritable bowel syndrome)   . Lumbago   . Lump or mass in breast 2012   right breast US guided finesse 1 o'clock  . Obesity   . Osteoarthritis    knees s/p surgeries  . Osteopenia 09/2014   mild by DEXA (-1.2 femur neck)  . Perennial allergic rhinitis   . Weakness of left upper extremity     PAST SURGICAL HISTORY: Past Surgical History:  Procedure Laterality Date  . BREAST BIOPSY Right 2012  . BREAST LUMPECTOMY Left 1995   benign mass  . CARDIOVASCULAR STRESS TEST  12/2004   nl myoview, preserved LV fxn with EF  71% without reversible ischemia ([place])  . COLONOSCOPY  2010   hyperplastic polyps identified. rpt 10 yrs ([place])  . COLONOSCOPY  03/2020   rectal petechiae ([place])  . COLONOSCOPY WITH PROPOFOL N/A 04/26/2020   Procedure: COLONOSCOPY WITH PROPOFOL;  Surgeon: Robert Bellow, MD;  Location: [facility] ENDOSCOPY;  Service: Endoscopy;  Laterality: N/A;  . DEXA  09/2014   T -1.2 femur neck  . ESOPHAGOGASTRODUODENOSCOPY  2010   [place]  . KNEE SURGERY Right 2010   arthroscopic  . KNEE SURGERY Left 2014    FAMILY HISTORY: Family History  Problem Relation Age of Onset  . Diverticulitis Mother   . Heart failure Mother   . Cancer Maternal Aunt        bone, and unsure  . CAD Father 39       MI x3  . Sudden death Brother 84    SOCIAL HISTORY: Social History   Socioeconomic History  . Marital status: Divorced    Spouse name: Not on file  . Number of children: 0  . Years of education: some college  . Highest education level: Not on file  Occupational History  . Occupation: Retired  Tobacco Use  . Smoking status: Former Smoker    Packs/day: 1.00    Years: 30.00    Pack years: 30.00    Types: Cigarettes    Quit date: 05/31/2008    Years since quitting: 12.2  . Smokeless tobacco: Never Used  Vaping Use  . Vaping Use: Never used  Substance and Sexual Activity  . Alcohol use: Yes    Comment: occasionally wine  . Drug  use: No  . Sexual activity: Not Currently  Other Topics Concern  . Not on file  Social History Narrative   Lives alone, 2 feral cats   Occupation: retired, worked for [employer]   Edu: [employer]   Activity: works in yard, unable to walk 2/2 knee pain   Diet: good water, fruits/vegetables daily   Right-handed.   No daily use of caffeine. Occasional tea.    Social Determinants of Health   Financial Resource Strain: Low Risk   . Difficulty of Paying Living Expenses: Not hard at all  Food Insecurity: No Food Insecurity  . Worried About Charity fundraiser in the Last Year: Never true  . Ran Out of Food in the Last Year: Never true  Transportation Needs: No Transportation Needs  . Lack of Transportation (Medical): No  . Lack of Transportation (Non-Medical): No  Physical Activity: Inactive  . Days of Exercise per Week: 0 days  . Minutes of Exercise per Session: 0 min  Stress: No Stress Concern Present  . Feeling of Stress : Not at all  Social Connections: Not on file  Intimate Partner Violence: Not At Risk  . Fear of Current or Ex-Partner: No  . Emotionally Abused: No  . Physically Abused: No  . Sexually Abused: No     PHYSICAL EXAM   Vitals:   08/24/20 1040  BP: (!) 168/70  Pulse: 74  Weight: 225 lb (102.1 kg)  Height: 5' 3.5" (1.613 m)   Not recorded     Body mass index is 39.23 kg/m.  PHYSICAL EXAMNIATION:  Gen: NAD, conversant, well nourised, well groomed                     Cardiovascular: Regular rate rhythm, no peripheral edema, warm, nontender. Eyes: Conjunctivae clear without exudates or hemorrhage Neck: Supple, no carotid bruits.  Pulmonary: Clear to auscultation bilaterally   NEUROLOGICAL EXAM:  MENTAL STATUS: Speech:    Speech is normal; fluent and spontaneous with normal comprehension.  Cognition:     Orientation to time, place and person     Normal recent and remote memory     Normal Attention span and concentration     Normal Language,  naming, repeating,spontaneous speech     Fund of knowledge   CRANIAL NERVES: CN II: Visual fields are full to confrontation. Pupils are round equal and briskly reactive to light. CN III, IV, VI: extraocular movement are normal. No ptosis. CN V: Facial sensation is intact to light touch CN VII: Face is symmetric with normal eye closure  CN VIII: Hearing is normal to causal conversation. CN IX, X: Phonation is normal. CN XI: Head turning and shoulder shrug are intact  MOTOR: Flaccid left hand weakness below left wrist, there was no significant left proximal muscle weakness, mild limitation due to left shoulder pain, she has mild to moderate left finger abduction, finger extension, flexion, grip weakness, no significant left wrist flexion extension weakness  REFLEXES: More brisk reflex at the left upper extremity 3 at left brachial radialis, biceps triceps in comparison to the right upper extremity 2, decreased bilateral patellar reflex,  SENSORY: No sensory loss detected  COORDINATION: There is no trunk or limb dysmetria noted.  GAIT/STANCE: She needs push-up to get up from seated position mild antalgic  Significant pain along left lower cervical upper thoracic paraspinal area  with deep palpitation DIAGNOSTIC DATA (LABS, IMAGING, TESTING) - I reviewed patient records, labs, notes, testing and imaging myself where available.   ASSESSMENT AND PLAN  Monica Wise is a 75 y.o. female   Left hand weakness  In left C8, T1 dermatome, with corresponding left lower cervical upper thoracic paraspinal muscle tenderness upon deep palpitation, hyperreflexia of left C5, C6, C7 deep tendon reflex,  Localized relation to left upper thoracic region,   The atypical feature is lack of sensory complaints,  Differentiation diagnosis including left lower cervical radiculopathy versus central cord syndrome with preferential involvement of left anterior horn region  I have suggested MRI of cervical  spine, patient reported significant claustrophobia even with open MRI and Valium previously, will start with CT of cervical spine, if needed may consider sedation and MRI of cervical spine  EMG nerve conduction study  Gabapentin 100 mg up to 3 times a day  Marcial Pacas, M.D. Ph.D.  [facility] [address], [place], [place] [address] Ph: [phone] Fax: [phone]  CC:  Owens Loffler, MD [place],  [place] [address]  Ria Bush, MD

## 2020-08-28 ENCOUNTER — Ambulatory Visit: Payer: Medicare Other | Admitting: Family Medicine

## 2020-09-05 ENCOUNTER — Ambulatory Visit
Admission: RE | Admit: 2020-09-05 | Discharge: 2020-09-05 | Disposition: A | Discharge status: Home / Self Care | Payer: Medicare Other | Source: Ambulatory Visit | Attending: Neurology | Admitting: Neurology

## 2020-09-05 ENCOUNTER — Other Ambulatory Visit: Payer: Self-pay

## 2020-09-05 DIAGNOSIS — R531 Weakness: Secondary | ICD-10-CM | POA: Diagnosis not present

## 2020-09-05 DIAGNOSIS — M5021 Other cervical disc displacement,  high cervical region: Secondary | ICD-10-CM | POA: Diagnosis not present

## 2020-09-05 DIAGNOSIS — M542 Cervicalgia: Secondary | ICD-10-CM

## 2020-09-05 DIAGNOSIS — M25512 Pain in left shoulder: Secondary | ICD-10-CM | POA: Diagnosis not present

## 2020-09-05 DIAGNOSIS — R29898 Other symptoms and signs involving the musculoskeletal system: Secondary | ICD-10-CM

## 2020-09-05 DIAGNOSIS — M4802 Spinal stenosis, cervical region: Secondary | ICD-10-CM | POA: Diagnosis not present

## 2020-09-06 ENCOUNTER — Telehealth: Payer: Self-pay | Admitting: Neurology

## 2020-09-06 NOTE — Telephone Encounter (Signed)
  IMPRESSION: 1. No acute fracture or static subluxation of the cervical spine. 2. 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 3. No spinal canal stenosis. 4. Reversal of normal cervical lordosis may be positional or due to muscle spasm.  Please call patient, CT of cervical spine showed no evidence of acute abnormality, evidence of severe foraminal stenosis at left C5, right C6, C7, reversal of normal cervical lordosis. Will review films at her next follow up.

## 2020-09-06 NOTE — Telephone Encounter (Signed)
I spoke to the patient and provided her with the CT cervical results. She will keep her pending appt for NCV/EMG scheduled on 09/13/20.

## 2020-09-13 ENCOUNTER — Encounter: Payer: Medicare Other | Admitting: Neurology

## 2020-09-25 ENCOUNTER — Ambulatory Visit (INDEPENDENT_AMBULATORY_CARE_PROVIDER_SITE_OTHER): Payer: Medicare Other | Admitting: Neurology

## 2020-09-25 ENCOUNTER — Ambulatory Visit: Payer: Medicare Other | Admitting: Neurology

## 2020-09-25 DIAGNOSIS — R29898 Other symptoms and signs involving the musculoskeletal system: Secondary | ICD-10-CM

## 2020-09-25 DIAGNOSIS — R202 Paresthesia of skin: Secondary | ICD-10-CM

## 2020-09-25 DIAGNOSIS — M542 Cervicalgia: Secondary | ICD-10-CM | POA: Diagnosis not present

## 2020-09-25 DIAGNOSIS — M5412 Radiculopathy, cervical region: Secondary | ICD-10-CM | POA: Insufficient documentation

## 2020-09-25 NOTE — Progress Notes (Signed)
No chief complaint on file.   HISTORICAL  Monica Wise is a 75 year old female, seen in request by her primary care doctor Ria Bush for evaluation of left arm weakness, initial evaluation was on August 24, 2020.  I reviewed and summarized the referring note.PMHx HTN Chronic migraine GERD Seasonal allergery HLD Knee arthroscopic surgery. Right 2010, Left 2014.  She lives alone, still driving, was highly function, did depend on her cane for ambulating since her knee surgery, she fell on ice on July 18, 2020, landed on her left shoulder, hip, she was able to get up afterwards, has a bruise at her left lateral arm, hip  About a week later, she noticed difficulty of her left hand, difficulty with grab things, make a tight fist, difficulty putting on her socks, fastening her bra, also develop significant left lower neck, upper thoracic pain, felt cold numbing sensation of her whole left hand  She was recently treated with tapering dose of steroid, which has helped her pain significantly, but continued to have left hand weakness, could not make a tight grip, she denies involvement of her right arm, no change in her gait abnormality, no bowel and bladder incontinence, no left face involvement,  Update September 25, 2020: Patient return for electrodiagnostic study today, which showed evidence of left T1, C8, C7 more than C6 radiculopathy, involving distal left ulnar, median, radial distributions, with well-preserved sensory response.  There was no spontaneous activity at left cervical paraspinal muscles, evidence of moderate right carpal tunnel syndrome  Personally reviewed CT cervical spine on September 05, 2020, no acute fracture, severe foraminal stenosis at left C5, right C6, 7, no significant canal stenosis, reversal of cervical lordosis  Patient refused to have MRI of cervical spine due to reported severe claustrophobia, I have suggested her for neurosurgeon for second opinion, she  wants to consider it at this point,  She has baseline gait abnormality, contributed to her previous knee surgery, urinary urgency frequency, no significant worsening since fall  REVIEW OF SYSTEMS: Full 14 system review of systems performed and notable only for as above All other review of systems were negative.  ALLERGIES: Allergies  Allergen Reactions  . Altace [Ramipril]     unknown  . Benicar [Olmesartan]     unknown  . Codeine Nausea And Vomiting  . Demerol [Meperidine] Nausea And Vomiting  . Statins Other (See Comments)    Myalgias to crestor, refused further statins  . Sulfa Antibiotics     Unknown reaction per patient    HOME MEDICATIONS: Current Outpatient Medications  Medication Sig Dispense Refill  . albuterol (PROVENTIL HFA;VENTOLIN HFA) 108 (90 Base) MCG/ACT inhaler Inhale 1-2 puffs into the lungs every 6 (six) hours as needed for wheezing or shortness of breath. 18 g 3  . Apple Cid Vn-Grn Tea-Bit Or-Cr (APPLE CIDER VINEGAR PLUS) TABS Take 2 tablets by mouth 3 (three) times daily before meals.    Marland Kitchen aspirin-acetaminophen-caffeine (EXCEDRIN MIGRAINE) 250-250-65 MG tablet Take 1 tablet by mouth every 6 (six) hours as needed for headache.    Marland Kitchen azelastine (ASTELIN) 0.1 % nasal spray Place 1 spray into both nostrils 2 (two) times daily. Use in each nostril as directed    . b complex vitamins tablet Take 1 tablet by mouth as needed.    . benzonatate (TESSALON) 100 MG capsule Take 1 capsule (100 mg total) by mouth 3 (three) times daily as needed for cough. 30 capsule 0  . CETIRIZINE HCL PO Take 1 tablet by  mouth as needed.    . Cholecalciferol (VITAMIN D) 2000 units CAPS Take 1 capsule by mouth daily.    Marland Kitchen ezetimibe (ZETIA) 10 MG tablet Take 1 tablet (10 mg total) by mouth daily. 90 tablet 3  . famotidine (PEPCID) 20 MG tablet Take 20 mg by mouth as needed.     Marland Kitchen Fexofenadine HCl (ALLEGRA PO) Take by mouth daily.    . fluticasone (FLONASE) 50 MCG/ACT nasal spray Place 1 spray  into both nostrils 2 (two) times daily. 16 g 0  . fluticasone furoate-vilanterol (BREO ELLIPTA) 200-25 MCG/INH AEPB Inhale 1 puff into the lungs daily. 30 each 11  . furosemide (LASIX) 20 MG tablet TAKE 1 TABLET (20 MG TOTAL) BY MOUTH DAILY AS NEEDED FOR FLUID. 90 tablet 0  . gabapentin (NEURONTIN) 100 MG capsule Take 1 capsule (100 mg total) by mouth 3 (three) times daily. 90 capsule 6  . HYDROcodone-acetaminophen (NORCO/VICODIN) 5-325 MG tablet Take 1 tablet by mouth every 6 (six) hours as needed for moderate pain. 20 tablet 0  . Ibuprofen-diphenhydrAMINE Cit (ADVIL PM PO) Take 1 tablet by mouth as needed (sleep). She does not use in combination with melatonin.    . Ibuprofen-diphenhydrAMINE HCl (ADVIL PM) 200-25 MG CAPS Take 1 tablet by mouth at bedtime as needed. 28 capsule   . Loperamide HCl (ANTI-DIARRHEAL PO) Take by mouth.    . MELATONIN PO Take 5-10 mg by mouth at bedtime.    . metaxalone (SKELAXIN) 800 MG tablet Take by mouth.    . mometasone (ELOCON) 0.1 % cream Apply 1 application topically daily. (Patient taking differently: Apply 1 application topically as needed.) 45 g 0  . montelukast (SINGULAIR) 10 MG tablet TAKE 1 TABLET BY MOUTH EVERY DAY IN THE EVENING 90 tablet 1  . Multiple Vitamin (MULTIVITAMIN) tablet Take 1 tablet by mouth daily.    . potassium chloride (K-DUR) 10 MEQ tablet Take 1 tablet (10 mEq total) by mouth daily as needed. With lasix 30 tablet 2  . predniSONE (DELTASONE) 20 MG tablet 2 tabs po daily for 5 days, then 1 tab po daily for 5 days 15 tablet 0  . SPIRIVA HANDIHALER 18 MCG inhalation capsule INHALE 1 CAPSULE VIA HANDIHALER ONCE DAILY AT THE SAME TIME EVERY DAY 30 capsule 11  . telmisartan (MICARDIS) 20 MG tablet Take 20 mg by mouth daily.    . TURMERIC PO Take 1,000 mg by mouth daily.     No current facility-administered medications for this visit.    PAST MEDICAL HISTORY: Past Medical History:  Diagnosis Date  . Adrenal adenoma, right 10/18/2016    Incidental by CT 09/2016  . Aortic valve sclerosis 2014   withoout stenosis, mild aortic insufficiency - Dr Ubaldo Glassing  . Asthma   . CAD (coronary artery disease) 10/18/2016   By CT 09/2016  . Centrilobular emphysema ([place]) 10/18/2016   By CT 09/2016  . Chronic diarrhea 2011   worse in am, ?IBS  . COPD (chronic obstructive pulmonary disease) ([place]) 2005   chronic bronchitis - spirometry FVC 70%, FEV1 72%, abnormal loop volume (unknown date)  . Essential hypertension   . Ex-smoker   . GERD (gastroesophageal reflux disease)   . Heart murmur   . History of chicken pox   . History of measles   . Hyperlipidemia   . IBS (irritable bowel syndrome)   . Lumbago   . Lump or mass in breast 2012   right breast US guided finesse 1 o'clock  . Obesity   .  Osteoarthritis    knees s/p surgeries  . Osteopenia 09/2014   mild by DEXA (-1.2 femur neck)  . Perennial allergic rhinitis   . Weakness of left upper extremity     PAST SURGICAL HISTORY: Past Surgical History:  Procedure Laterality Date  . BREAST BIOPSY Right 2012  . BREAST LUMPECTOMY Left 1995   benign mass  . CARDIOVASCULAR STRESS TEST  12/2004   nl myoview, preserved LV fxn with EF 71% without reversible ischemia ([place])  . COLONOSCOPY  2010   hyperplastic polyps identified. rpt 10 yrs ([place])  . COLONOSCOPY  03/2020   rectal petechiae ([place])  . COLONOSCOPY WITH PROPOFOL N/A 04/26/2020   Procedure: COLONOSCOPY WITH PROPOFOL;  Surgeon: Robert Bellow, MD;  Location: [facility] ENDOSCOPY;  Service: Endoscopy;  Laterality: N/A;  . DEXA  09/2014   T -1.2 femur neck  . ESOPHAGOGASTRODUODENOSCOPY  2010   [place]  . KNEE SURGERY Right 2010   arthroscopic  . KNEE SURGERY Left 2014    FAMILY HISTORY: Family History  Problem Relation Age of Onset  . Diverticulitis Mother   . Heart failure Mother   . Cancer Maternal Aunt        bone, and unsure  . CAD Father 43       MI x3  . Sudden death Brother 93    SOCIAL HISTORY: Social History    Socioeconomic History  . Marital status: Divorced    Spouse name: Not on file  . Number of children: 0  . Years of education: some college  . Highest education level: Not on file  Occupational History  . Occupation: Retired  Tobacco Use  . Smoking status: Former Smoker    Packs/day: 1.00    Years: 30.00    Pack years: 30.00    Types: Cigarettes    Quit date: 05/31/2008    Years since quitting: 12.3  . Smokeless tobacco: Never Used  Vaping Use  . Vaping Use: Never used  Substance and Sexual Activity  . Alcohol use: Yes    Comment: occasionally wine  . Drug use: No  . Sexual activity: Not Currently  Other Topics Concern  . Not on file  Social History Narrative   Lives alone, 2 feral cats   Occupation: retired, worked for [employer]   Edu: [employer]   Activity: works in yard, unable to walk 2/2 knee pain   Diet: good water, fruits/vegetables daily   Right-handed.   No daily use of caffeine. Occasional tea.    Social Determinants of Health   Financial Resource Strain: Low Risk   . Difficulty of Paying Living Expenses: Not hard at all  Food Insecurity: No Food Insecurity  . Worried About Charity fundraiser in the Last Year: Never true  . Ran Out of Food in the Last Year: Never true  Transportation Needs: No Transportation Needs  . Lack of Transportation (Medical): No  . Lack of Transportation (Non-Medical): No  Physical Activity: Inactive  . Days of Exercise per Week: 0 days  . Minutes of Exercise per Session: 0 min  Stress: No Stress Concern Present  . Feeling of Stress : Not at all  Social Connections: Not on file  Intimate Partner Violence: Not At Risk  . Fear of Current or Ex-Partner: No  . Emotionally Abused: No  . Physically Abused: No  . Sexually Abused: No     PHYSICAL EXAM   There were no vitals filed for this visit. Not recorded  There is no height or weight on file to calculate BMI.  PHYSICAL EXAMNIATION:  Gen: NAD, conversant, well  nourised, well groomed       NEUROLOGICAL EXAM:  MENTAL STATUS: Speech/cognition: Awake, alert, oriented to history taking and care of conversation  CRANIAL NERVES: CN II: Visual fields are full to confrontation. Pupils are round equal and briskly reactive to light. CN III, IV, VI: extraocular movement are normal. No ptosis. CN V: Facial sensation is intact to light touch CN VII: Face is symmetric with normal eye closure  CN VIII: Hearing is normal to causal conversation. CN IX, X: Phonation is normal. CN XI:  decreased left shoulder shrugging  MOTOR:  Shoulder Abduction Shoulder External Rotation Elbow Flexion Elbow  Extension Pronation Supination Wrist Flexion Wrist Extension Grip  Finger abduction Finger flexion/Extension  R 5 5 5 5 5 5 5 5 5 5 5   L 5- 5- 5 5 5- 5- 4 5- 4 3 5   No significant bilateral lower extremity weakness  REFLEXES: ,brisker  reflex at the left upper extremity, 3 at left brachial radialis, biceps triceps in comparison to the right upper extremity 2, decreased bilateral patellar reflex,  SENSORY: No sensory loss detected  COORDINATION: There is no trunk or limb dysmetria noted.  GAIT/STANCE: She needs push-up to get up from seated position mild antalgic  Significant pain along left lower cervical upper thoracic paraspinal area  with deep palpitation DIAGNOSTIC DATA (LABS, IMAGING, TESTING) - I reviewed patient records, labs, notes, testing and imaging myself where available.   ASSESSMENT AND PLAN  Monica Wise is a 74 y.o. female   Left hand weakness  In left C8, T1 more than C7 C6 dermatome, with corresponding left lower cervical upper thoracic paraspinal muscle tenderness upon deep palpitation, hyperreflexia of left C5, C6, C7 deep tendon reflex,  CT of cervical spine showed evidence of multilevel spondylosis, severe foraminal stenosis at left C5, right C6-C7,  The atypical feature is lack of sensory complaints,  Differentiation diagnosis  including left lower cervical radiculopathy, avulsion of left cervical roots  I have suggested MRI of cervical spine, patient reported significant claustrophobia even with open MRI and Valium previously   I have also suggested a potential neurosurgical referral for evaluation, she wants to hold it  at this point, will call back to clinic if she desires further evaluation  Gabapentin 100 mg up to 3 times a day  Marcial Pacas, M.D. Ph.D.  [facility] [address] [place], [place] [address] Ph: [phone] Fax: [phone]  CC:  Owens Loffler, MD [place],  [place] [address]  Ria Bush, MD

## 2020-09-26 NOTE — Procedures (Signed)
Full Name: Monica Wise Gender: Female MRN #: [id number] Date of Birth: [birth year]    Visit Date: 09/25/2020 09:38 Age: 75 Years Examining Physician: Marcial Pacas, MD  Referring Physician: Marcial Pacas, MD History: 75 year old female complains of left hand paresthesia and weakness since her fall landed on the left side in January 2022.  Summary of the test: Nerve conduction study: Bilateral ulnar, left median sensory responses were normal.  Right median sensory response showed moderately prolonged peak latency with significantly decreased snap amplitude  Right ulnar motor responses showed no significant abnormality.  Right median to EDB showed anatomic variations, with enlarged CMAP amplitude at proximal stimulation site compared to distal stimulation site.  Left ulnar, median motor responses showed significantly decreased CMAP amplitude  Electromyography: Selective needle examinations were performed of bilateral upper extremity muscles, left cervical paraspinal muscles. There is evidence of active denervation, neuropathic changes involving left first dorsal interossei, abductor pollicis brevis, extensor digitorum communis, lesser degree of left brachioradialis, pronator teres, there was no significant abnormality at muscles above left elbow, there was no spontaneous activity at left cervical paraspinals,  There was no significant abnormality in selected needle examination of right upper extremity muscles   Conclusion: This is an abnormal study.  There is electrodiagnostic evidence of left T1, C8 more than C7 radiculopathy.  In addition, there is evidence of moderate right carpal tunnel syndromes.    ------------------------------- Marcial Pacas, M.D. PhD  [facility] [address] [place], [place] [address] Tel: [phone] Fax: [phone]  Verbal informed consent was obtained from the patient, patient was informed of potential risk of procedure,  including bruising, bleeding, hematoma formation, infection, muscle weakness, muscle pain, numbness, among others.        Monica Wise    Nerve / Sites Muscle Latency Ref. Amplitude Ref. Rel Amp Segments Distance Velocity Ref. Area    ms ms mV mV %  cm m/s m/s mVms  R Median - APB     Wrist APB 4.4 ?4.4 0.7 ?4.0 100 Wrist - APB 7   3.2     Upper arm APB 8.6  2.8  402 Upper arm - Wrist 21 50 ?49 10.4     Ulnar Wrist APB 2.5  4.5  161 Ulnar Wrist - APB    10.4     Ulnar Below Elbow APB 5.7  3.1  69.5 Ulnar Below Elbow - APB    7.8     Ulnar Above Elbow  7.3  3.0  95.8 Ulnar Above Elbow - Ulnar Below Elbow    7.2  L Median - APB     Wrist APB 4.3 ?4.4 0.1 ?4.0 100 Wrist - APB 7   0.3     Upper arm APB 7.8  0.2  236 Upper arm - Wrist 20 56 ?49 1.0     Ulnar Wrist APB 3.0  1.4  677 Ulnar Wrist - APB    3.8     Ulnar Below Elbow APB 6.7  1.4  105 Ulnar Below Elbow - APB    3.1     Ulnar Above Elbow  8.6  1.4  97.3 Ulnar Above Elbow - Ulnar Below Elbow    3.1  L Ulnar - ADM     Wrist ADM 3.7 ?3.3 0.8 ?6.0 100 Wrist - ADM 7   1.7     B.Elbow ADM 6.3  2.9  344 B.Elbow - Wrist 18 68 ?49 9.6     A.Elbow  ADM 8.2  3.1  109 A.Elbow - B.Elbow 10 55 ?49 10.5  R Ulnar - ADM     Wrist ADM 2.3 ?3.3 8.5 ?6.0 100 Wrist - ADM 7   21.7     B.Elbow ADM 5.3  6.3  73.9 B.Elbow - Wrist 18 60 ?49 18.0     A.Elbow ADM 7.1  7.1  112 A.Elbow - B.Elbow 10 55 ?49 19.3             SNC    Nerve / Sites Rec. Site Peak Lat Ref.  Amp Ref. Segments Distance    ms ms V V  cm  R Median - Orthodromic (Dig II, Mid palm)     Dig II Wrist 4.6 ?3.4 3 ?10 Dig II - Wrist 13  L Median - Orthodromic (Dig II, Mid palm)     Dig II Wrist 3.4 ?3.4 9 ?10 Dig II - Wrist 13  R Ulnar - Orthodromic, (Dig V, Mid palm)     Dig V Wrist 2.7 ?3.1 7 ?5 Dig V - Wrist 11  L Ulnar - Orthodromic, (Dig V, Mid palm)     Dig V Wrist 2.5 ?3.1 7 ?5 Dig V - Wrist 22             F  Wave    Nerve F Lat Ref.   ms ms  L Ulnar - ADM 31.9 ?32.0  R Ulnar -  ADM 27.3 ?32.0         EMG Summary Table    Spontaneous MUAP Recruitment  Muscle IA Fib PSW Fasc Other Amp Dur. Poly Pattern  L. First dorsal interosseous Increased 2+ 2+ None _______ Increased Increased 1+ Reduced  L. Abductor pollicis brevis Increased 2+ None None _______ Increased Increased 1+ Reduced  L. Abductor digiti minimi (manus) Increased 2+ None None _______ Increased Increased 1+ Reduced  L. Pronator teres Increased None None None _______ Normal Normal Normal Reduced  L. Brachioradialis Increased None None None _______ Normal Normal Normal Reduced  L. Extensor digitorum communis Increased None None None _______ Increased Increased 1+ Reduced  L. Biceps brachii Normal None None None _______ Normal Normal Normal Normal  L. Deltoid Normal None None None _______ Normal Normal Normal Normal  L. Triceps brachii Normal None None None _______ Normal Normal Normal Normal  L. Cervical paraspinals Normal None None None _______ Normal Normal Normal Normal  R. First dorsal interosseous Normal None None None _______ Normal Normal Normal Normal  R. Pronator teres Normal None None None _______ Normal Normal Normal Normal  R. Biceps brachii Normal None None None _______ Normal Normal Normal Normal  R. Deltoid Normal None None None _______ Normal Normal Normal Normal  R. Triceps brachii Normal None None None _______ Normal Normal Normal Normal  L. Flexor digitorum profundus (Ulnar) Increased 1+ None None _______ Increased Increased 1+ Reduced  R. Flexor carpi ulnaris Normal None None None _______ Normal Normal Normal Normal

## 2020-11-14 ENCOUNTER — Telehealth: Payer: Self-pay

## 2020-11-14 NOTE — Telephone Encounter (Signed)
Placed form in Dr. G's box.  

## 2020-11-14 NOTE — Telephone Encounter (Signed)
Pt dropped off handicap placard renewal application to be signed. She states that she would like to pick it up this afternoon. I let her know that she would have to get a call when it is finished. She would like a call on her cell [phone]. Placed in provider's folder up front.

## 2020-11-15 ENCOUNTER — Telehealth: Payer: Self-pay | Admitting: Family Medicine

## 2020-11-15 NOTE — Telephone Encounter (Signed)
Lvm asking pt to call back.  Need to notify pt her form is ready to pick up.  [Placed parking placard at front office- yellow folders.]

## 2020-11-15 NOTE — Telephone Encounter (Signed)
Patient call in notified her that [place] was ready for pick up

## 2020-11-15 NOTE — Telephone Encounter (Signed)
Filled and in lisa's box.  

## 2020-11-16 NOTE — Telephone Encounter (Signed)
Left message on vm per dpr notifying pt parking placard is ready to pick up.   [Placed parking placard at front office- yellow folders.]

## 2020-12-04 ENCOUNTER — Telehealth: Payer: Self-pay

## 2020-12-04 ENCOUNTER — Encounter: Payer: Self-pay | Admitting: Family Medicine

## 2020-12-04 ENCOUNTER — Telehealth (INDEPENDENT_AMBULATORY_CARE_PROVIDER_SITE_OTHER): Payer: Medicare Other | Admitting: Family Medicine

## 2020-12-04 ENCOUNTER — Telehealth: Payer: Self-pay | Admitting: *Deleted

## 2020-12-04 VITALS — BP 118/73 | HR 59 | Temp 97.5°F

## 2020-12-04 DIAGNOSIS — J432 Centrilobular emphysema: Secondary | ICD-10-CM | POA: Diagnosis not present

## 2020-12-04 DIAGNOSIS — Z03818 Encounter for observation for suspected exposure to other biological agents ruled out: Secondary | ICD-10-CM | POA: Diagnosis not present

## 2020-12-04 DIAGNOSIS — R059 Cough, unspecified: Secondary | ICD-10-CM

## 2020-12-04 DIAGNOSIS — Z20822 Contact with and (suspected) exposure to covid-19: Secondary | ICD-10-CM | POA: Diagnosis not present

## 2020-12-04 DIAGNOSIS — U071 COVID-19: Secondary | ICD-10-CM

## 2020-12-04 MED ORDER — MOLNUPIRAVIR EUA 200MG CAPSULE: 4.0000 | ORAL_CAPSULE | Freq: Two times a day (BID) | ORAL | 0 refills | Status: AC

## 2020-12-04 MED ORDER — ALBUTEROL SULFATE HFA 108 (90 BASE) MCG/ACT IN AERS: 2.0000 | INHALATION_SPRAY | Freq: Four times a day (QID) | RESPIRATORY_TRACT | 2 refills | Status: AC | PRN

## 2020-12-04 NOTE — Progress Notes (Signed)
     Monica Wise T. Monica Heikkila, MD Primary Care and Sports Medicine [facility] [place], [address] Phone: [phone]  FAX: [phone]  Monica Wise - 75 y.o. female  MRN [id number]  Date of Birth: 1945-09-06  Visit Date: 12/04/2020  PCP: Ria Bush, MD  Referred by: Ria Bush, MD  Virtual Visit via Telephone Note:  I connected with  Monica Wise on 12/04/2020  4:00 PM EDT by telephone and verified that I am speaking with the correct person using two identifiers.   Location patient: home phone or cell phone Location provider: work or home office Consent: Verbal consent directly obtained from Monica Wise and that there may be a patient responsible charge related to this service. Persons participating in the virtual visit: patient, provider  I discussed the limitations of evaluation and management by telemedicine and the availability of in person appointments.  The patient expressed understanding and agreed to proceed.     History of Present Illness:  She has Covid - woke up on Saturday.  She has not had a fever, but she feels not well.  She does have COPD.  She is taking Spiriva, but she did stop her Breo.  Coughing is bad, throat is sore.  Chest.  She is having a deep cough. No temp. Has been taking some ibuprofen, helped some with.   ? A little diarrhea this AM. Coffee?  No changes to taste or smell.  Start the [place] - once a day - has not been doing it.    Review of Systems: pertinent positives and pertinent negatives as per HPI No acute distress verbally   Observations/Objective/Exam:  An attempt was made to discern vital signs over the phone and per patient if applicable and possible.   Neurological:     Mental Status: pleasant and appropriate   Psychiatric:        Thought Content: Thought content normal.      Assessment and Plan:    ICD-10-CM   1. COVID-19  U07.1   2. Centrilobular emphysema  ([place])  J43.2    Total encounter time: 10 minutes. This includes total time spent on the day of encounter.  COVID-positive, start antivirals, refilled her albuterol.  She is going to restart her Breo.  Precautions given.  I discussed the assessment and treatment plan with the patient. The patient was provided an opportunity to ask questions and all were answered. The patient agreed with the plan and demonstrated an understanding of the instructions.   The patient was advised to call back or seek an in-person evaluation if the symptoms worsen or if the condition fails to improve as anticipated.  Follow-up: prn unless noted otherwise below No follow-ups on file.  Meds ordered this encounter  Medications  . albuterol (VENTOLIN HFA) 108 (90 Base) MCG/ACT inhaler    Sig: Inhale 2 puffs into the lungs every 6 (six) hours as needed for wheezing or shortness of breath.    Dispense:  8 g    Refill:  2  . molnupiravir EUA 200 mg CAPS    Sig: Take 4 capsules (800 mg total) by mouth 2 (two) times daily for 5 days.    Dispense:  40 capsule    Refill:  0   No orders of the defined types were placed in this encounter.   Signed,  Maud Deed. Adelaido Nicklaus, MD

## 2020-12-04 NOTE — Telephone Encounter (Signed)
See other phone note regarding covid.

## 2020-12-04 NOTE — Telephone Encounter (Addendum)
Patient called back stating that she went to [facility] and they did a rapid covid test and a PCR test. Patient stated that the rapid test came back positive. Patient stated that she started with symptoms Saturday. Patient stated that she felt like she was going to die Saturday..Patient complains of a terrible cough and headache.. Patient stated that she has not had a fever that she knows of but did wake up wet this morning. Patient has history of COPD but denies any more SOB than usual.  Patient stated that she is having more body aches than usual. Patient stated that she has been taking Ibuprofen as needed. Patient given ER precautions and verbalized understanding.  [facility]

## 2020-12-04 NOTE — Telephone Encounter (Signed)
Pt states that she had a covid exposure last week and became symptomatic on Saturday morning with a cough, fatigue and a fever. She had her 2nd covid booster on 11/24/20. She does not know how to do a covid home test, so doesn't feel comfortable doing one. Pt was provided with the information of the testing site, [facility], and states that she will go there to get tested, due to [place] not having any appts today. Pt will call us back with her results.

## 2020-12-04 NOTE — Telephone Encounter (Signed)
Would offer to test her here this afternoon if she hasn't already gone. I can do COVID call whenever we get test results. I have ordered COVID swab.

## 2020-12-04 NOTE — Telephone Encounter (Signed)
Spoke to patient by telephone and was advised that she can only do a phone visit. Patient stated that she does not know how to do a virtual visit. Patient scheduled for a phone visit today with Dr. Lorelei Pont at 4:30.

## 2020-12-04 NOTE — Addendum Note (Signed)
Addended by: Ria Bush on: 12/04/2020 02:12 PM   Modules accepted: Orders

## 2020-12-04 NOTE — Telephone Encounter (Signed)
See other phone note

## 2020-12-06 ENCOUNTER — Telehealth: Payer: Self-pay

## 2020-12-06 NOTE — Telephone Encounter (Signed)
Called patient to schedule annual lung cancer screening CT scan. She has COVID right now and would like a call back in a couple weeks.

## 2020-12-07 NOTE — Telephone Encounter (Signed)
Appreciate Dr Lorelei Pont seeing patient. Can we call for update on COVID symptoms?  Symptoms started 6/4

## 2020-12-07 NOTE — Telephone Encounter (Signed)
Spoke with pt for an update.  States she still has cough but rx is helping and runny nose is better.  However, still has no energy.

## 2021-01-23 ENCOUNTER — Other Ambulatory Visit: Payer: Self-pay | Admitting: Family Medicine

## 2021-02-15 DIAGNOSIS — M7742 Metatarsalgia, left foot: Secondary | ICD-10-CM | POA: Diagnosis not present

## 2021-02-15 DIAGNOSIS — I2510 Atherosclerotic heart disease of native coronary artery without angina pectoris: Secondary | ICD-10-CM | POA: Diagnosis not present

## 2021-02-15 DIAGNOSIS — J432 Centrilobular emphysema: Secondary | ICD-10-CM | POA: Diagnosis not present

## 2021-02-15 DIAGNOSIS — I10 Essential (primary) hypertension: Secondary | ICD-10-CM | POA: Diagnosis not present

## 2021-02-15 DIAGNOSIS — I358 Other nonrheumatic aortic valve disorders: Secondary | ICD-10-CM | POA: Diagnosis not present

## 2021-02-15 DIAGNOSIS — M7741 Metatarsalgia, right foot: Secondary | ICD-10-CM | POA: Diagnosis not present

## 2021-03-08 ENCOUNTER — Telehealth: Payer: Self-pay

## 2021-03-08 MED ORDER — FLUTICASONE FUROATE-VILANTEROL 200-25 MCG/INH IN AEPB
1.0000 | INHALATION_SPRAY | Freq: Every day | RESPIRATORY_TRACT | 2 refills | Status: DC
Start: 2021-03-08 — End: 2021-06-18

## 2021-03-08 MED ORDER — FLUTICASONE FUROATE-VILANTEROL 200-25 MCG/INH IN AEPB
1.0000 | INHALATION_SPRAY | Freq: Every day | RESPIRATORY_TRACT | 2 refills | Status: DC
Start: 2021-03-08 — End: 2021-03-08

## 2021-03-08 NOTE — Telephone Encounter (Signed)
Pt calling requesting refill for Breo inhaler.  Says the pharmacy told her they sent 2 requests.  I notified pt we haven't seen any refill requests.   E-scribed refill.

## 2021-03-15 DIAGNOSIS — I359 Nonrheumatic aortic valve disorder, unspecified: Secondary | ICD-10-CM | POA: Diagnosis not present

## 2021-04-15 IMAGING — CT CT CERVICAL SPINE W/O CM
3 series · 8 of 14 positions shown, 9 images · non-contrast
Comparison: None.

CLINICAL DATA: Neck pain radiating to the left shoulder with left
hand weakness

EXAM:
CT CERVICAL SPINE WITHOUT CONTRAST
TECHNIQUE: Multidetector CT imaging of the cervical spine was performed without
intravenous contrast. Multiplanar CT image reconstructions were also
generated.

[Series 2: cspine soft · axial · 0.38mm/px · z∈[-210,-110]mm · 3 of 100 slices shown]
[im 25/100  soft-tissue]
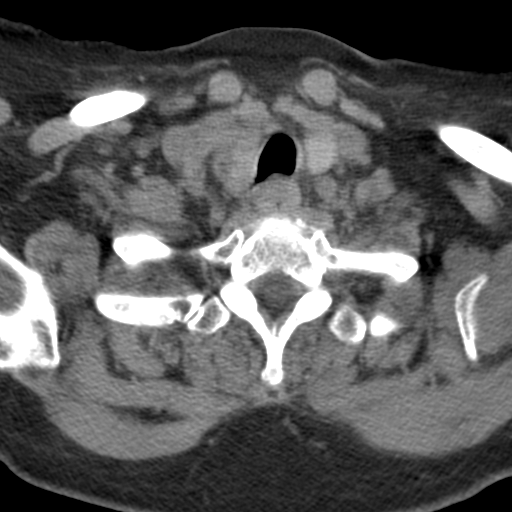
[im 50/100  soft-tissue]
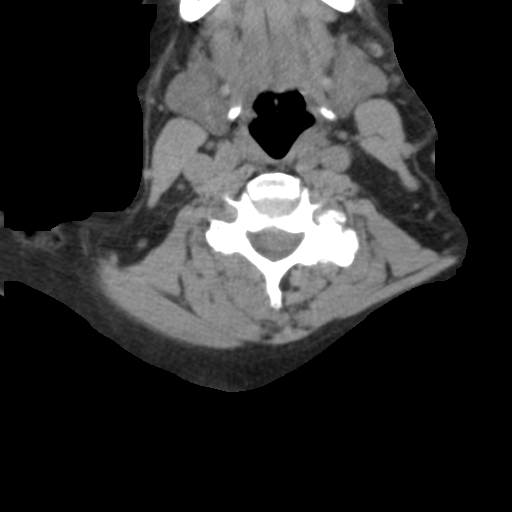
[im 75/100  soft-tissue]
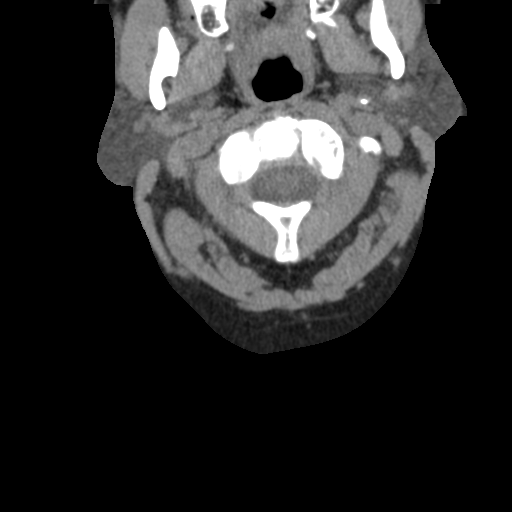

[Series 3: c spine bone · axial · 0.38mm/px · z∈[-190,-126]mm · 2 of 98 slices shown, 3 images]
[im 33/98  soft-tissue]
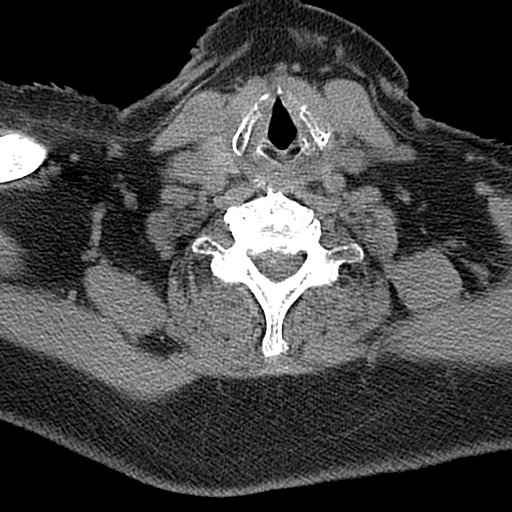
[im 33/98  bone]
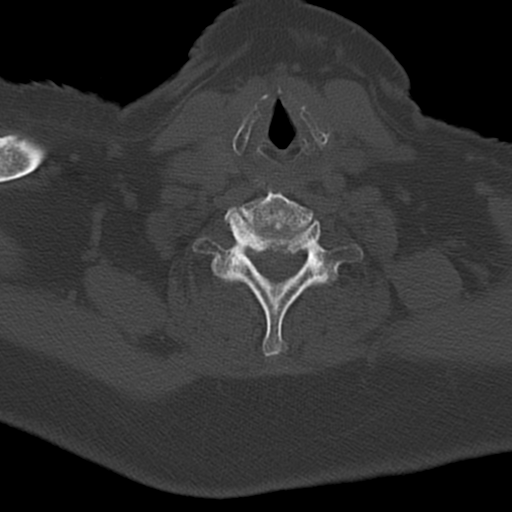
[im 65/98  bone]
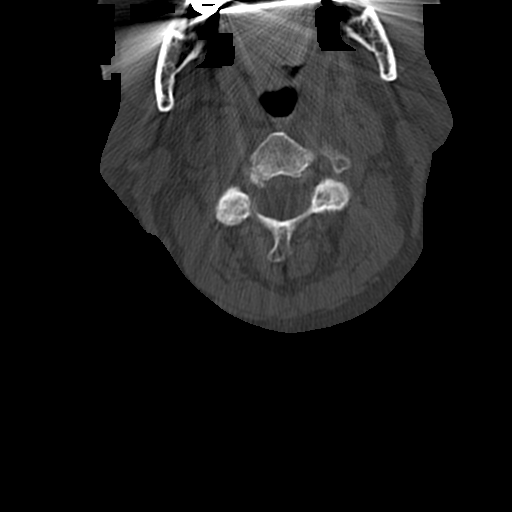

[Series 8: angled axial soft · axial · 0.29mm/px · z∈[-242,-145]mm · 3 of 113 slices shown]
[im 29/113  soft-tissue]
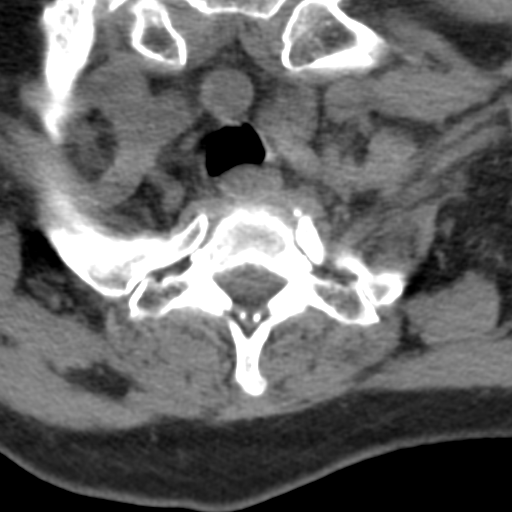
[im 57/113  soft-tissue]
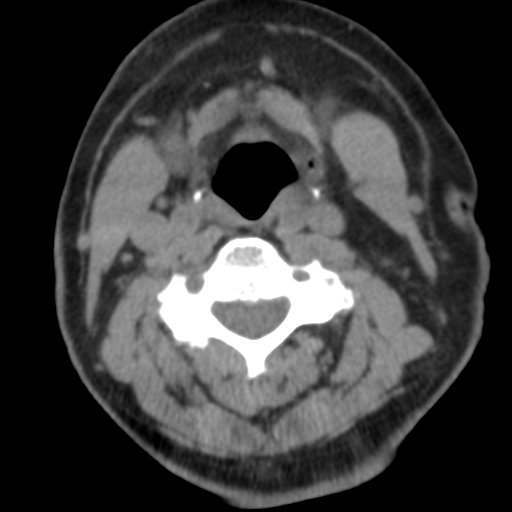
[im 85/113  soft-tissue]
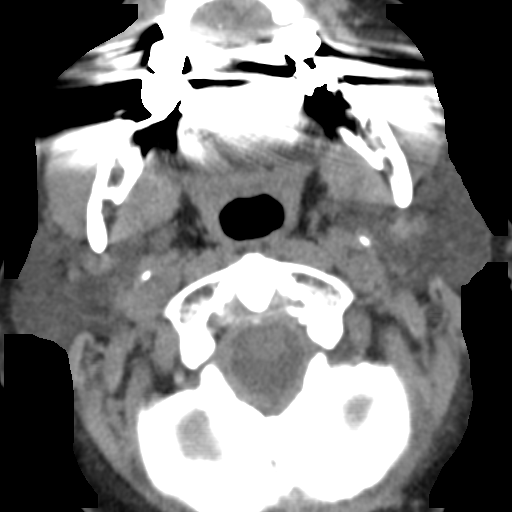

[8 of 14 positions shown; findings below may reference images not displayed]

FINDINGS: Alignment: Reversal of normal cervical lordosis may be positional or
due to muscle spasm.

Skull base and vertebrae: No acute fracture. No primary bone lesion
or focal pathologic process.

Soft tissues and spinal canal: No prevertebral fluid or swelling. No
visible canal hematoma.

Disc levels: Mild ligamentous hypertrophy at C1-2 without spinal
canal stenosis.

C2-3: Unremarkable.

C3-4: Small disc bulge with ligamentous calcification. Uncovertebral
hypertrophy. Moderate bilateral foraminal stenosis.

C4-5: Severe left facet hypertrophy. Severe left foraminal stenosis.

C5-6: Disc space narrowing with intermediate sized disc osteophyte
complex, predominantly right subarticular. Severe right foraminal
stenosis.

C6-7: Right-greater-than-left uncovertebral hypertrophy with severe
right and mild left foraminal stenosis.

Upper chest: Negative.

Other: None.
IMPRESSION: 1. No acute fracture or static subluxation of the cervical spine.
2. Severe foraminal stenosis at left C5, right C6 and right C7.
3. No spinal canal stenosis.
4. Reversal of normal cervical lordosis may be positional or due to
muscle spasm.

## 2021-05-09 ENCOUNTER — Telehealth: Payer: Self-pay | Admitting: Family Medicine

## 2021-05-10 NOTE — Telephone Encounter (Signed)
Lvm for pt to call office for cpe/awv/lab

## 2021-05-10 NOTE — Telephone Encounter (Signed)
E-scribed refill.  Plz schedule wellness, cpe and lab visits.  

## 2021-05-11 ENCOUNTER — Ambulatory Visit: Payer: Medicare Other

## 2021-05-11 NOTE — Telephone Encounter (Signed)
Noted  

## 2021-05-11 NOTE — Telephone Encounter (Signed)
Pt scheduled labs/awv/cpe in february

## 2021-06-13 DIAGNOSIS — Z1231 Encounter for screening mammogram for malignant neoplasm of breast: Secondary | ICD-10-CM | POA: Diagnosis not present

## 2021-06-13 LAB — HM MAMMOGRAPHY

## 2021-06-15 ENCOUNTER — Other Ambulatory Visit: Payer: Self-pay | Admitting: Family Medicine

## 2021-06-19 DIAGNOSIS — Z1239 Encounter for other screening for malignant neoplasm of breast: Secondary | ICD-10-CM | POA: Diagnosis not present

## 2021-07-04 ENCOUNTER — Ambulatory Visit: Payer: Medicare Other

## 2021-07-16 ENCOUNTER — Other Ambulatory Visit: Payer: Self-pay

## 2021-07-16 DIAGNOSIS — Z87891 Personal history of nicotine dependence: Secondary | ICD-10-CM

## 2021-07-23 ENCOUNTER — Ambulatory Visit
Admission: RE | Admit: 2021-07-23 | Discharge: 2021-07-23 | Disposition: A | Discharge status: Home / Self Care | Payer: Medicare Other | Source: Ambulatory Visit | Attending: Acute Care | Admitting: Acute Care

## 2021-07-23 ENCOUNTER — Other Ambulatory Visit: Payer: Self-pay

## 2021-07-23 DIAGNOSIS — Z87891 Personal history of nicotine dependence: Secondary | ICD-10-CM | POA: Insufficient documentation

## 2021-07-25 ENCOUNTER — Other Ambulatory Visit: Payer: Self-pay

## 2021-07-25 DIAGNOSIS — Z87891 Personal history of nicotine dependence: Secondary | ICD-10-CM

## 2021-07-28 ENCOUNTER — Encounter: Payer: Self-pay | Admitting: Family Medicine

## 2021-07-28 DIAGNOSIS — I700 Atherosclerosis of aorta: Secondary | ICD-10-CM | POA: Insufficient documentation

## 2021-08-06 NOTE — Progress Notes (Signed)
Subjective:   MARIJA Wise is a 76 y.o. female who presents for Medicare Annual (Subsequent) preventive examination.  I connected with Monica Wise today by telephone and verified that I am speaking with the correct person using two identifiers. Location patient: home Location provider: work Persons participating in the virtual visit: patient, Marine scientist.    I discussed the limitations, risks, security and privacy concerns of performing an evaluation and management service by telephone and the availability of in person appointments. I also discussed with the patient that there may be a patient responsible charge related to this service. The patient expressed understanding and verbally consented to this telephonic visit.    Interactive audio and video telecommunications were attempted between this provider and patient, however failed, due to patient having technical difficulties OR patient did not have access to video capability.  We continued and completed visit with audio only.  Some vital signs may be absent or patient reported.   Time Spent with patient on telephone encounter: 30 minutes  Review of Systems     Cardiac Risk Factors include: advanced age (>79men, >63 women);hypertension;dyslipidemia     Objective:    Today's Vitals   08/07/21 1157  Weight: 215 lb (97.5 kg)  Height: 5\' 3"  (1.6 m)   Body mass index is 38.09 kg/m.  Advanced Directives 08/07/2021 04/26/2020 03/21/2020 03/18/2019 10/09/2017 10/03/2016  Does Patient Have a Medical Advance Directive? Yes Yes No No Yes Yes  Type of Advance Directive Healthcare Power of [place]  Does patient want to make changes to medical advance directive? Yes (MAU/Ambulatory/Procedural Areas - Information given) - - - - -  Copy of Healthcare Power of Attorney in Chart? - No - copy requested - - No - copy requested No - copy requested  Would patient like  information on creating a medical advance directive? - - No - Patient declined No - Patient declined - -    Current Medications (verified) Outpatient Encounter Medications as of 08/07/2021  Medication Sig   albuterol (VENTOLIN HFA) 108 (90 Base) MCG/ACT inhaler Inhale 2 puffs into the lungs every 6 (six) hours as needed for wheezing or shortness of breath.   Apple Cid Vn-Grn Tea-Bit Or-Cr (APPLE CIDER VINEGAR PLUS) TABS Take 2 tablets by mouth 3 (three) times daily before meals.   aspirin-acetaminophen-caffeine (EXCEDRIN MIGRAINE) 250-250-65 MG tablet Take 1 tablet by mouth every 6 (six) hours as needed for headache.   b complex vitamins tablet Take 1 tablet by mouth as needed.   Cholecalciferol (VITAMIN D) 2000 units CAPS Take 1 capsule by mouth daily.   ezetimibe (ZETIA) 10 MG tablet TAKE 1 TABLET BY MOUTH ONCE DAILY   famotidine (PEPCID) 20 MG tablet Take 20 mg by mouth as needed.    Fexofenadine HCl (ALLEGRA PO) Take by mouth daily.   furosemide (LASIX) 20 MG tablet TAKE 1 TABLET (20 MG TOTAL) BY MOUTH DAILY AS NEEDED FOR FLUID.   gabapentin (NEURONTIN) 100 MG capsule Take 1 capsule (100 mg total) by mouth 3 (three) times daily.   HYDROcodone-acetaminophen (NORCO/VICODIN) 5-325 MG tablet Take 1 tablet by mouth every 6 (six) hours as needed for moderate pain.   Ibuprofen-diphenhydrAMINE HCl (ADVIL PM) 200-25 MG CAPS Take 1 tablet by mouth at bedtime as needed.   Loperamide HCl (ANTI-DIARRHEAL PO) Take by mouth.   MELATONIN PO Take 5-10 mg by mouth at bedtime.   montelukast (SINGULAIR) 10 MG tablet  TAKE 1 TABLET BY MOUTH EVERY DAY IN THE EVENING   Multiple Vitamin (MULTIVITAMIN) tablet Take 1 tablet by mouth daily.   potassium chloride (K-DUR) 10 MEQ tablet Take 1 tablet (10 mEq total) by mouth daily as needed. With lasix   SPIRIVA HANDIHALER 18 MCG inhalation capsule INHALE 1 CAPSULE VIA HANDIHALER ONCE DAILY AT THE SAME TIME EVERY DAY   telmisartan (MICARDIS) 20 MG tablet Take 20 mg by  mouth daily.   azelastine (ASTELIN) 0.1 % nasal spray Place 1 spray into both nostrils 2 (two) times daily. Use in each nostril as directed (Patient not taking: Reported on 08/07/2021)   benzonatate (TESSALON) 100 MG capsule Take 1 capsule (100 mg total) by mouth 3 (three) times daily as needed for cough.   BREO ELLIPTA 200-25 MCG/ACT AEPB TAKE 1 PUFF BY MOUTH EVERY DAY   CETIRIZINE HCL PO Take 1 tablet by mouth as needed. (Patient not taking: Reported on 08/07/2021)   fluticasone (FLONASE) 50 MCG/ACT nasal spray Place 1 spray into both nostrils 2 (two) times daily. (Patient not taking: Reported on 08/07/2021)   Ibuprofen-diphenhydrAMINE Cit (ADVIL PM PO) Take 1 tablet by mouth as needed (sleep). She does not use in combination with melatonin.   metaxalone (SKELAXIN) 800 MG tablet Take by mouth.   mometasone (ELOCON) 0.1 % cream Apply 1 application topically daily. (Patient taking differently: Apply 1 application topically as needed.)   predniSONE (DELTASONE) 20 MG tablet 2 tabs po daily for 5 days, then 1 tab po daily for 5 days (Patient not taking: Reported on 08/07/2021)   TURMERIC PO Take 1,000 mg by mouth daily. (Patient not taking: Reported on 08/07/2021)   No facility-administered encounter medications on file as of 08/07/2021.    Allergies (verified) Altace [ramipril], Benicar [olmesartan], Codeine, Demerol [meperidine], Statins, and Sulfa antibiotics   History: Past Medical History:  Diagnosis Date   Adrenal adenoma, right 10/18/2016   Incidental by CT 09/2016   Aortic valve sclerosis 2014   withoout stenosis, mild aortic insufficiency - Dr Ubaldo Glassing   Asthma    CAD (coronary artery disease) 10/18/2016   By CT 09/2016   Centrilobular emphysema ([place]) 10/18/2016   By CT 09/2016   Chronic diarrhea 2011   worse in am, ?IBS   COPD (chronic obstructive pulmonary disease) ([place]) 2005   chronic bronchitis - spirometry FVC 70%, FEV1 72%, abnormal loop volume (unknown date)   Essential hypertension     Ex-smoker    GERD (gastroesophageal reflux disease)    Heart murmur    History of chicken pox    History of measles    Hyperlipidemia    IBS (irritable bowel syndrome)    Lumbago    Lump or mass in breast 2012   right breast US guided finesse 1 o'clock   Obesity    Osteoarthritis    knees s/p surgeries   Osteopenia 09/2014   mild by DEXA (-1.2 femur neck)   Perennial allergic rhinitis    Weakness of left upper extremity    Past Surgical History:  Procedure Laterality Date   BREAST BIOPSY Right 2012   BREAST LUMPECTOMY Left 1995   benign mass   CARDIOVASCULAR STRESS TEST  12/2004   nl myoview, preserved LV fxn with EF 71% without reversible ischemia ([place])   COLONOSCOPY  2010   hyperplastic polyps identified. rpt 10 yrs ([facility])   COLONOSCOPY  03/2020   rectal petechiae ([place])   COLONOSCOPY WITH PROPOFOL N/A 04/26/2020   Procedure: COLONOSCOPY WITH PROPOFOL;  Surgeon: Robert Bellow, MD;  Location: [facility] ENDOSCOPY;  Service: Endoscopy;  Laterality: N/A;   DEXA  09/2014   T -1.2 femur neck   ESOPHAGOGASTRODUODENOSCOPY  2010   [place]   KNEE SURGERY Right 2010   arthroscopic   KNEE SURGERY Left 2014   Family History  Problem Relation Age of Onset   Diverticulitis Mother    Heart failure Mother    Cancer Maternal Aunt        bone, and unsure   CAD Father 33       MI x3   Sudden death Brother 63   Social History   Socioeconomic History   Marital status: Divorced    Spouse name: Not on file   Number of children: 0   Years of education: some college   Highest education level: Not on file  Occupational History   Occupation: Retired  Tobacco Use   Smoking status: Former    Packs/day: 1.00    Years: 30.00    Pack years: 30.00    Types: Cigarettes    Quit date: 05/31/2008    Years since quitting: 13.1   Smokeless tobacco: Never  Vaping Use   Vaping Use: Never used  Substance and Sexual Activity   Alcohol use: Yes    Comment: occasionally wine   Drug  use: No   Sexual activity: Not Currently  Other Topics Concern   Not on file  Social History Narrative   Lives alone, 2 feral cats   Occupation: retired, worked for [employer]   Edu: TEFL teacher   Activity: works in yard, unable to walk 2/2 knee pain   Diet: good water, fruits/vegetables daily   Right-handed.   No daily use of caffeine. Occasional tea.    Social Determinants of Health   Financial Resource Strain: Low Risk    Difficulty of Paying Living Expenses: Not hard at all  Food Insecurity: No Food Insecurity   Worried About Charity fundraiser in the Last Year: Never true   [place] in the Last Year: Never true  Transportation Needs: No Transportation Needs   Lack of Transportation (Medical): No   Lack of Transportation (Non-Medical): No  Physical Activity: Insufficiently Active   Days of Exercise per Week: 7 days   Minutes of Exercise per Session: 10 min  Stress: No Stress Concern Present   Feeling of Stress : Not at all  Social Connections: Moderately Isolated   Frequency of Communication with Friends and Family: More than three times a week   Frequency of Social Gatherings with Friends and Family: More than three times a week   Attends Religious Services: 1 to 4 times per year   Active Member of [employer] or Organizations: No   Attends Archivist Meetings: Never   Marital Status: Widowed    Tobacco Counseling Counseling given: Not Answered   Clinical Intake:  Pre-visit preparation completed: Yes  Pain : No/denies pain     BMI - recorded: 38.09 Nutritional Status: BMI > 30  Obese Nutritional Risks: None Diabetes: No  How often do you need to have someone help you when you read instructions, pamphlets, or other written materials from your doctor or pharmacy?: 1 - Never  Diabetic? No  Interpreter Needed?: No  Information entered by :: Orrin Brigham LPN   Activities of Daily Living In your present state of health, do you have any  difficulty performing the following activities: 08/07/2021  Hearing? N  Vision? N  Difficulty concentrating or making decisions? N  Walking or climbing stairs? Y  Dressing or bathing? N  Doing errands, shopping? N  Preparing Food and eating ? N  Using the Toilet? N  In the past six months, have you accidently leaked urine? N  Do you have problems with loss of bowel control? N  Managing your Medications? N  Managing your Finances? N  Housekeeping or managing your Housekeeping? N  Some recent data might be hidden    Patient Care Team: Ria Bush, MD as PCP - General (Family Medicine) Bary Castilla, Forest Gleason, MD (General Surgery) Lorelee Market, MD (Family Medicine) Owens Loffler, MD as Consulting Physician (Orthopedic Surgery)  Indicate any recent Medical Services you may have received from other than [facility] providers in the past year (date may be approximate).     Assessment:   This is a routine wellness examination for Grae.  Hearing/Vision screen Hearing Screening - Comments:: No issues  Vision Screening - Comments:: Last exam over a year ago  Dietary issues and exercise activities discussed: Current Exercise Habits: Home exercise routine, Type of exercise: walking, Time (Minutes): 10, Frequency (Times/Week): 7, Weekly Exercise (Minutes/Week): 70, Intensity: Mild   Goals Addressed             This Visit's Progress    Patient Stated       Would like to maintain current routine        Depression Screen PHQ 2/9 Scores 08/07/2021 03/21/2020 03/18/2019 10/09/2017 10/03/2016 09/26/2015 09/19/2014  PHQ - 2 Score 0 0 0 2 1 0 0  PHQ- 9 Score - 0 0 7 - - -    Fall Risk Fall Risk  08/07/2021 03/21/2020 03/18/2019 06/16/2018 10/09/2017  Falls in the past year? 0 0 0 0 Yes  Comment - - - - fell off porch into flower bed; bruising only with no medical treatment  Number falls in past yr: 0 0 0 - 1  Injury with Fall? 0 0 - - Yes  Risk for fall due to : - Medication side effect  Medication side effect;Impaired balance/gait - -  Follow up Falls prevention discussed Falls evaluation completed;Falls prevention discussed Falls evaluation completed;Falls prevention discussed - -    FALL RISK PREVENTION PERTAINING TO THE HOME:  Any stairs in or around the home? Yes  If so, are there any without handrails? No  Home free of loose throw rugs in walkways, pet beds, electrical cords, etc? Yes  Adequate lighting in your home to reduce risk of falls? Yes   ASSISTIVE DEVICES UTILIZED TO PREVENT FALLS:  Life alert? No  Use of a cane, walker or w/c? Yes , cane  Grab bars in the bathroom? No  Shower chair or bench in shower? Yes  Elevated toilet seat or a handicapped toilet? No   TIMED UP AND GO:  Was the test performed? No .    Cognitive Function: Normal cognitive status assessed by this Nurse Health Advisor. No abnormalities found.   MMSE - Mini Mental State Exam 03/21/2020 03/18/2019 10/09/2017 10/03/2016  Orientation to time 5 5 5 5   Orientation to Place 5 5 5 5   Registration 3 3 3 3   Attention/ Calculation 5 5 0 0  Recall 3 3 3 3   Language- name 2 objects - - 0 0  Language- repeat 1 1 1 1   Language- follow 3 step command - - 3 3  Language- read & follow direction - - 0 0  Write a sentence - - 0  0  Copy design - - 0 0  Total score - - 20 20        Immunizations Immunization History  Administered Date(s) Administered   Fluad Quad(high Dose 65+) 04/06/2019, 03/22/2020   Influenza, High Dose Seasonal PF 06/12/2017, 04/18/2018   Influenza-Unspecified 03/31/2014, 03/02/2015, 05/22/2016, 05/08/2021   PFIZER(Purple Top)SARS-COV-2 Vaccination 07/28/2019, 08/18/2019, 04/20/2020   Pneumococcal Conjugate-13 10/03/2015   Pneumococcal Polysaccharide-23 10/03/2016   Pneumococcal-Unspecified 07/02/1999   Zoster, Live 07/04/2014    TDAP status: Due, Education has been provided regarding the importance of this vaccine. Advised may receive this vaccine at local pharmacy  or Health Dept. Aware to provide a copy of the vaccination record if obtained from local pharmacy or Health Dept. Verbalized acceptance and understanding.  Flu Vaccine status: Up to date  Pneumococcal vaccine status: Up to date  Covid-19 vaccine status: Information provided on how to obtain vaccines.   Qualifies for Shingles Vaccine? Yes   Zostavax completed Yes   Shingrix Completed?: No.    Education has been provided regarding the importance of this vaccine. Patient has been advised to call insurance company to determine out of pocket expense if they have not yet received this vaccine. Advised may also receive vaccine at local pharmacy or Health Dept. Verbalized acceptance and understanding.  Screening Tests Health Maintenance  Topic Date Due   Zoster Vaccines- Shingrix (1 of 2) Never done   MAMMOGRAM  06/09/2019   COVID-19 Vaccine (4 - Booster for Pfizer series) 06/15/2020   TETANUS/TDAP  03/21/2024 (Originally 03/10/1965)   Pneumonia Vaccine 28+ Years old  Completed   INFLUENZA VACCINE  Completed   DEXA SCAN  Completed   Hepatitis C Screening  Completed   HPV VACCINES  Aged Out   COLONOSCOPY (Pts 45-3yrs Insurance coverage will need to be confirmed)  [place] Maintenance Due  Topic Date Due   Zoster Vaccines- Shingrix (1 of 2) Never done   MAMMOGRAM  06/09/2019   COVID-19 Vaccine (4 - Booster for [place] series) 06/15/2020    Colorectal cancer screening: No longer required.   Mammogram status: Completed 06/13/21. Repeat every year  One Density status: Patient plans to discuss with PCP  Lung Cancer Screening: (Low Dose CT Chest recommended if Age 60-80 years, 30 pack-year currently smoking OR have quit w/in 15years.) does qualify. Lung CT scan completed 07/23/21    Additional Screening:  Hepatitis C Screening: does qualify; Completed 09/18/15  Vision Screening: Recommended annual ophthalmology exams for early detection of glaucoma and  other disorders of the eye. Is the patient up to date with their annual eye exam?  No  Who is the provider or what is the name of the office in which the patient attends annual eye exams? Provider information unavailable    Dental Screening: Recommended annual dental exams for proper oral hygiene  Community Resource Referral / Chronic Care Management: CRR required this visit?  No   CCM required this visit?  No      Plan:     I have personally reviewed and noted the following in the patients chart:   Medical and social history Use of alcohol, tobacco or illicit drugs  Current medications and supplements including opioid prescriptions.  Functional ability and status Nutritional status Physical activity Advanced directives List of other physicians Hospitalizations, surgeries, and ER visits in previous 12 months Vitals Screenings to include cognitive, depression, and falls Referrals and appointments  In addition, I have reviewed and discussed with patient  certain preventive protocols, quality metrics, and best practice recommendations. A written personalized care plan for preventive services as well as general preventive health recommendations were provided to patient.   Due to this being a telephonic visit, the after visit summary with patients personalized plan was offered to patient via mail or my-chart. Patient preferred to pick up at office at next visit.   Loma Messing, LPN   02/01/8349   Nurse Health Advisor  Nurse Notes: none

## 2021-08-07 ENCOUNTER — Ambulatory Visit (INDEPENDENT_AMBULATORY_CARE_PROVIDER_SITE_OTHER): Payer: Medicare Other

## 2021-08-07 VITALS — Ht 63.0 in | Wt 215.0 lb

## 2021-08-07 DIAGNOSIS — D2372 Other benign neoplasm of skin of left lower limb, including hip: Secondary | ICD-10-CM | POA: Diagnosis not present

## 2021-08-07 DIAGNOSIS — M79672 Pain in left foot: Secondary | ICD-10-CM | POA: Diagnosis not present

## 2021-08-07 DIAGNOSIS — Z0000 Encounter for general adult medical examination without abnormal findings: Secondary | ICD-10-CM | POA: Diagnosis not present

## 2021-08-07 DIAGNOSIS — M7672 Peroneal tendinitis, left leg: Secondary | ICD-10-CM | POA: Diagnosis not present

## 2021-08-07 NOTE — Patient Instructions (Signed)
[name] , Thank you for taking time to complete your Medicare Wellness Visit. I appreciate your ongoing commitment to your health goals. Please review the following plan we discussed and let me know if I can assist you in the future.   Screening recommendations/referrals: Colonoscopy: no longer required  Mammogram: up to date, completed 06/13/21, please have update report sent for your chart  Bone Density: due, last completed 10/17/24 Recommended yearly ophthalmology/optometry visit for glaucoma screening and checkup Recommended yearly dental visit for hygiene and checkup  Vaccinations: Influenza vaccine: up to date   Pneumococcal vaccine: up to date  Tdap vaccine: Due-May obtain vaccine at your local pharmacy. Shingles vaccine: Due-May obtain vaccine at your local pharmacy.   Covid-19: newest booster available at your local pharmacy   Advanced directives: Please bring a copy of Living Will and/or [place] for your chart.   Conditions/risks identified: see problem list   Next appointment: Follow up in one year for your annual wellness visit 08/08/22 @ 2:45pm, this will be a telephone visit    Preventive Care 65 Years and Older, Female Preventive care refers to lifestyle choices and visits with your health care provider that can promote health and wellness. What does preventive care include? A yearly physical exam. This is also called an annual well check. Dental exams once or twice a year. Routine eye exams. Ask your health care provider how often you should have your eyes checked. Personal lifestyle choices, including: Daily care of your teeth and gums. Regular physical activity. Eating a healthy diet. Avoiding tobacco and drug use. Limiting alcohol use. Practicing safe sex. Taking low-dose aspirin every day. Taking vitamin and mineral supplements as recommended by your health care provider. What happens during an annual well check? The services and  screenings done by your health care provider during your annual well check will depend on your age, overall health, lifestyle risk factors, and family history of disease. Counseling  Your health care provider may ask you questions about your: Alcohol use. Tobacco use. Drug use. Emotional well-being. Home and relationship well-being. Sexual activity. Eating habits. History of falls. Memory and ability to understand (cognition). Work and work Statistician. Reproductive health. Screening  You may have the following tests or measurements: Height, weight, and BMI. Blood pressure. Lipid and cholesterol levels. These may be checked every 5 years, or more frequently if you are over 79 years old. Skin check. Lung cancer screening. You may have this screening every year starting at age 13 if you have a 30-pack-year history of smoking and currently smoke or have quit within the past 15 years. Fecal occult blood test (FOBT) of the stool. You may have this test every year starting at age 10. Flexible sigmoidoscopy or colonoscopy. You may have a sigmoidoscopy every 5 years or a colonoscopy every 10 years starting at age 81. Hepatitis C blood test. Hepatitis B blood test. Sexually transmitted disease (STD) testing. Diabetes screening. This is done by checking your blood sugar (glucose) after you have not eaten for a while (fasting). You may have this done every 1-3 years. Bone density scan. This is done to screen for osteoporosis. You may have this done starting at age 14. Mammogram. This may be done every 1-2 years. Talk to your health care provider about how often you should have regular mammograms. Talk with your health care provider about your test results, treatment options, and if necessary, the need for more tests. Vaccines  Your health care provider may recommend  certain vaccines, such as: Influenza vaccine. This is recommended every year. Tetanus, diphtheria, and acellular pertussis (Tdap,  Td) vaccine. You may need a Td booster every 10 years. Zoster vaccine. You may need this after age 56. Pneumococcal 13-valent conjugate (PCV13) vaccine. One dose is recommended after age 53. Pneumococcal polysaccharide (PPSV23) vaccine. One dose is recommended after age 63. Talk to your health care provider about which screenings and vaccines you need and how often you need them. This information is not intended to replace advice given to you by your health care provider. Make sure you discuss any questions you have with your health care provider. Document Released: 07/14/2015 Document Revised: 03/06/2016 Document Reviewed: 04/18/2015 Elsevier Interactive Patient Education  2017 [place] Prevention in the Home Falls can cause injuries. They can happen to people of all ages. There are many things you can do to make your home safe and to help prevent falls. What can I do on the outside of my home? Regularly fix the edges of walkways and driveways and fix any cracks. Remove anything that might make you trip as you walk through a door, such as a raised step or threshold. Trim any bushes or trees on the path to your home. Use bright outdoor lighting. Clear any walking paths of anything that might make someone trip, such as rocks or tools. Regularly check to see if handrails are loose or broken. Make sure that both sides of any steps have handrails. Any raised decks and porches should have guardrails on the edges. Have any leaves, snow, or ice cleared regularly. Use sand or salt on walking paths during winter. Clean up any spills in your garage right away. This includes oil or grease spills. What can I do in the bathroom? Use night lights. Install grab bars by the toilet and in the tub and shower. Do not use towel bars as grab bars. Use non-skid mats or decals in the tub or shower. If you need to sit down in the shower, use a plastic, non-slip stool. Keep the floor dry. Clean up any  water that spills on the floor as soon as it happens. Remove soap buildup in the tub or shower regularly. Attach bath mats securely with double-sided non-slip rug tape. Do not have throw rugs and other things on the floor that can make you trip. What can I do in the bedroom? Use night lights. Make sure that you have a light by your bed that is easy to reach. Do not use any sheets or blankets that are too big for your bed. They should not hang down onto the floor. Have a firm chair that has side arms. You can use this for support while you get dressed. Do not have throw rugs and other things on the floor that can make you trip. What can I do in the kitchen? Clean up any spills right away. Avoid walking on wet floors. Keep items that you use a lot in easy-to-reach places. If you need to reach something above you, use a strong step stool that has a grab bar. Keep electrical cords out of the way. Do not use floor polish or wax that makes floors slippery. If you must use wax, use non-skid floor wax. Do not have throw rugs and other things on the floor that can make you trip. What can I do with my stairs? Do not leave any items on the stairs. Make sure that there are handrails on both sides of the  stairs and use them. Fix handrails that are broken or loose. Make sure that handrails are as long as the stairways. Check any carpeting to make sure that it is firmly attached to the stairs. Fix any carpet that is loose or worn. Avoid having throw rugs at the top or bottom of the stairs. If you do have throw rugs, attach them to the floor with carpet tape. Make sure that you have a light switch at the top of the stairs and the bottom of the stairs. If you do not have them, ask someone to add them for you. What else can I do to help prevent falls? Wear shoes that: Do not have high heels. Have rubber bottoms. Are comfortable and fit you well. Are closed at the toe. Do not wear sandals. If you use a  stepladder: Make sure that it is fully opened. Do not climb a closed stepladder. Make sure that both sides of the stepladder are locked into place. Ask someone to hold it for you, if possible. Clearly mark and make sure that you can see: Any grab bars or handrails. First and last steps. Where the edge of each step is. Use tools that help you move around (mobility aids) if they are needed. These include: Canes. Walkers. Scooters. Crutches. Turn on the lights when you go into a dark area. Replace any light bulbs as soon as they burn out. Set up your furniture so you have a clear path. Avoid moving your furniture around. If any of your floors are uneven, fix them. If there are any pets around you, be aware of where they are. Review your medicines with your doctor. Some medicines can make you feel dizzy. This can increase your chance of falling. Ask your doctor what other things that you can do to help prevent falls. This information is not intended to replace advice given to you by your health care provider. Make sure you discuss any questions you have with your health care provider. Document Released: 04/13/2009 Document Revised: 11/23/2015 Document Reviewed: 07/22/2014 Elsevier Interactive Patient Education  2017 [employer].

## 2021-08-14 ENCOUNTER — Other Ambulatory Visit: Payer: Medicare Other

## 2021-08-21 ENCOUNTER — Encounter: Payer: Medicare Other | Admitting: Family Medicine

## 2021-08-23 ENCOUNTER — Other Ambulatory Visit: Payer: Self-pay | Admitting: Family Medicine

## 2021-08-30 ENCOUNTER — Other Ambulatory Visit: Payer: Self-pay | Admitting: Family Medicine

## 2021-08-30 DIAGNOSIS — E785 Hyperlipidemia, unspecified: Secondary | ICD-10-CM

## 2021-08-30 DIAGNOSIS — M85859 Other specified disorders of bone density and structure, unspecified thigh: Secondary | ICD-10-CM

## 2021-08-30 DIAGNOSIS — G720 Drug-induced myopathy: Secondary | ICD-10-CM

## 2021-08-31 ENCOUNTER — Other Ambulatory Visit (INDEPENDENT_AMBULATORY_CARE_PROVIDER_SITE_OTHER): Payer: Medicare Other

## 2021-08-31 ENCOUNTER — Other Ambulatory Visit: Payer: Self-pay

## 2021-08-31 DIAGNOSIS — E785 Hyperlipidemia, unspecified: Secondary | ICD-10-CM

## 2021-08-31 DIAGNOSIS — G720 Drug-induced myopathy: Secondary | ICD-10-CM | POA: Diagnosis not present

## 2021-08-31 DIAGNOSIS — M85859 Other specified disorders of bone density and structure, unspecified thigh: Secondary | ICD-10-CM

## 2021-08-31 DIAGNOSIS — T466X5A Adverse effect of antihyperlipidemic and antiarteriosclerotic drugs, initial encounter: Secondary | ICD-10-CM | POA: Diagnosis not present

## 2021-08-31 LAB — COMPREHENSIVE METABOLIC PANEL
ALT: 15 U/L (ref 0–35)
AST: 17 U/L (ref 0–37)
Albumin: 4.2 g/dL (ref 3.5–5.2)
Alkaline Phosphatase: 60 U/L (ref 39–117)
BUN: 18 mg/dL (ref 6–23)
CO2: 31 mEq/L (ref 19–32)
Calcium: 9.3 mg/dL (ref 8.4–10.5)
Chloride: 106 mEq/L (ref 96–112)
Creatinine, Ser: 0.94 mg/dL (ref 0.40–1.20)
GFR: 58.54 mL/min — ABNORMAL LOW (ref >60.00)
Glucose, Bld: 97 mg/dL (ref 70–99)
Potassium: 4.2 mEq/L (ref 3.5–5.1)
Sodium: 143 mEq/L (ref 135–145)
Total Bilirubin: 0.6 mg/dL (ref 0.2–1.2)
Total Protein: 7.1 g/dL (ref 6.0–8.3)

## 2021-08-31 LAB — LIPID PANEL
Cholesterol: 208 mg/dL — ABNORMAL HIGH (ref 0–200)
HDL: 75.6 mg/dL (ref >39.00)
LDL Cholesterol: 117 mg/dL — ABNORMAL HIGH (ref 0–99)
NonHDL: 132.54
Total CHOL/HDL Ratio: 3
Triglycerides: 79 mg/dL (ref 0.0–149.0)
VLDL: 15.8 mg/dL (ref 0.0–40.0)

## 2021-08-31 LAB — CBC WITH DIFFERENTIAL/PLATELET
Basophils Absolute: 0.1 10*3/uL (ref 0.0–0.1)
Basophils Relative: 0.8 % (ref 0.0–3.0)
Eosinophils Absolute: 0.2 10*3/uL (ref 0.0–0.7)
Eosinophils Relative: 3.5 % (ref 0.0–5.0)
HCT: 37.8 % (ref 36.0–46.0)
Hemoglobin: 12.7 g/dL (ref 12.0–15.0)
Lymphocytes Relative: 33.3 % (ref 12.0–46.0)
Lymphs Abs: 2.1 10*3/uL (ref 0.7–4.0)
MCHC: 33.6 g/dL (ref 30.0–36.0)
MCV: 90.5 fl (ref 78.0–100.0)
Monocytes Absolute: 0.4 10*3/uL (ref 0.1–1.0)
Monocytes Relative: 7 % (ref 3.0–12.0)
Neutro Abs: 3.5 10*3/uL (ref 1.4–7.7)
Neutrophils Relative %: 55.4 % (ref 43.0–77.0)
Platelets: 243 10*3/uL (ref 150.0–400.0)
RBC: 4.18 Mil/uL (ref 3.87–5.11)
RDW: 14.3 % (ref 11.5–15.5)
WBC: 6.4 10*3/uL (ref 4.0–10.5)

## 2021-08-31 LAB — TSH: TSH: 2.88 u[IU]/mL (ref 0.35–5.50)

## 2021-08-31 LAB — VITAMIN D 25 HYDROXY (VIT D DEFICIENCY, FRACTURES): VITD: 28.39 ng/mL — ABNORMAL LOW (ref 30.00–100.00)

## 2021-09-07 ENCOUNTER — Encounter: Payer: Self-pay | Admitting: Family Medicine

## 2021-09-07 ENCOUNTER — Other Ambulatory Visit: Payer: Self-pay

## 2021-09-07 ENCOUNTER — Ambulatory Visit (INDEPENDENT_AMBULATORY_CARE_PROVIDER_SITE_OTHER): Payer: Medicare Other | Admitting: Family Medicine

## 2021-09-07 VITALS — BP 154/84 | HR 70 | Temp 97.6°F | Ht 63.5 in | Wt 218.5 lb

## 2021-09-07 DIAGNOSIS — I6523 Occlusion and stenosis of bilateral carotid arteries: Secondary | ICD-10-CM

## 2021-09-07 DIAGNOSIS — Z7189 Other specified counseling: Secondary | ICD-10-CM | POA: Diagnosis not present

## 2021-09-07 DIAGNOSIS — Z87891 Personal history of nicotine dependence: Secondary | ICD-10-CM

## 2021-09-07 DIAGNOSIS — I10 Essential (primary) hypertension: Secondary | ICD-10-CM

## 2021-09-07 DIAGNOSIS — E785 Hyperlipidemia, unspecified: Secondary | ICD-10-CM | POA: Diagnosis not present

## 2021-09-07 DIAGNOSIS — M25549 Pain in joints of unspecified hand: Secondary | ICD-10-CM

## 2021-09-07 DIAGNOSIS — I2510 Atherosclerotic heart disease of native coronary artery without angina pectoris: Secondary | ICD-10-CM | POA: Diagnosis not present

## 2021-09-07 DIAGNOSIS — M85859 Other specified disorders of bone density and structure, unspecified thigh: Secondary | ICD-10-CM

## 2021-09-07 DIAGNOSIS — L409 Psoriasis, unspecified: Secondary | ICD-10-CM

## 2021-09-07 DIAGNOSIS — J432 Centrilobular emphysema: Secondary | ICD-10-CM

## 2021-09-07 DIAGNOSIS — Z0000 Encounter for general adult medical examination without abnormal findings: Secondary | ICD-10-CM

## 2021-09-07 DIAGNOSIS — G720 Drug-induced myopathy: Secondary | ICD-10-CM

## 2021-09-07 DIAGNOSIS — I358 Other nonrheumatic aortic valve disorders: Secondary | ICD-10-CM | POA: Diagnosis not present

## 2021-09-07 DIAGNOSIS — T466X5A Adverse effect of antihyperlipidemic and antiarteriosclerotic drugs, initial encounter: Secondary | ICD-10-CM

## 2021-09-07 DIAGNOSIS — I700 Atherosclerosis of aorta: Secondary | ICD-10-CM

## 2021-09-07 MED ORDER — FUROSEMIDE 20 MG PO TABS: 20.0000 mg | ORAL_TABLET | Freq: Every day | ORAL | 0 refills | Status: DC | PRN

## 2021-09-07 MED ORDER — FLUTICASONE FUROATE-VILANTEROL 200-25 MCG/ACT IN AEPB: INHALATION_SPRAY | RESPIRATORY_TRACT | 11 refills | Status: DC

## 2021-09-07 MED ORDER — IBUPROFEN 200 MG PO TABS: 600.0000 mg | ORAL_TABLET | Freq: Every day | ORAL | Status: AC | PRN

## 2021-09-07 MED ORDER — SPIRIVA HANDIHALER 18 MCG IN CAPS: ORAL_CAPSULE | RESPIRATORY_TRACT | 11 refills | Status: DC

## 2021-09-07 MED ORDER — MONTELUKAST SODIUM 10 MG PO TABS: ORAL_TABLET | ORAL | 3 refills | Status: DC

## 2021-09-07 NOTE — Progress Notes (Signed)
Patient ID: Monica Wise, female    DOB: 1946-06-21, 76 y.o.   MRN: [id number]  This visit was conducted in person.  BP (!) 154/84 (BP Location: Right Arm, Cuff Size: Large)    Pulse 70    Temp 97.6 F (36.4 C) (Temporal)    Ht 5' 3.5" (1.613 m)    Wt 218 lb 8 oz (99.1 kg)    SpO2 94%    BMI 38.10 kg/m   Markedly elevated on repeat testing   CC: CPE Subjective:   HPI: Monica Wise is a 76 y.o. female presenting on 09/07/2021 for Annual Exam (MCR prt 2. )   Saw health advisor last month for medicare wellness visit. Note reviewed.    No results found.  Flowsheet Row Clinical Support from 08/07/2021 in [facility]  PHQ-2 Total Score 0       Fall Risk  08/07/2021 03/21/2020 03/18/2019 06/16/2018 10/09/2017  Falls in the past year? 0 0 0 0 Yes  Comment - - - - fell off porch into flower bed; bruising only with no medical treatment  Number falls in past yr: 0 0 0 - 1  Injury with Fall? 0 0 - - Yes  Risk for fall due to : - Medication side effect Medication side effect;Impaired balance/gait - -  Follow up Falls prevention discussed Falls evaluation completed;Falls prevention discussed Falls evaluation completed;Falls prevention discussed - -   BP elevated today - she is on telmisartan '20mg'$  through cardiology Dr Monica Wise. Last seen 03/2021.   COVID infection 11/2020 - fully resolved.  She takes tylenol arthritis during day and tylenol PM at night.  Asks about Instaflex advance collagen   H/o psoriasis - remotely saw [facility] dermatology. No recent outbreaks since she started using body butter creams. Notes worsening finger joint pain and swelling over the past 6 months, mainly L ring finger PIP. No redness or warmth.   Preventative: COLONOSCOPY Date: 2010 hyperplastic polyps, rpt 10 yrs ([place]) COLONOSCOPY 03/2020 - rectal petechiae ([place])  Mammogram 05/2018 birads 2 @ [facility] - gets yearly through gen surgery  Well woman exam - remotely. Discussed, declines  further eval. G1P1. No vaginal bleeding  Lung cancer screening yearly, latest 07/2021 - centrilobular and paraseptal emphysema, aortic ATH, calcified CA  DEXA Date: 09/2014 T -1.2 femur neck  Flu shot - yearly  COVID vaccine - [place] 07/2019, 08/2019, booster 03/2020, 10/2020 Pneumovax 2001, prevnar 09/2015, pneumovax 09/2016  Zostavax 07/2014  Shingrix - discussed, declines for now Advanced directive: reviewed with patient - son is HCPOA. Has also discussed with local cousin. Does not want to complete advanced directive but wants to be full code, no prolonged life support if terminal condition. Encouraged she talk about wishes with son.  Seat belt use discussed  Sunscreen use discussed. No changing moles on skin  Ex smoker - quit 2009. 30 PY hx.  Alcohol - few glasses of wine per week  Bowel - no constipation  Bladder - no incontinence   Lives alone, son nearby Occupation: retired, worked for [employer] Edu: [employer] Activity: works in yard, unable to walk 2/2 knee pain  Diet: good water, fruits/vegetables daily      Relevant past medical, surgical, family and social history reviewed and updated as indicated. Interim medical history since our last visit reviewed. Allergies and medications reviewed and updated. Outpatient Medications Prior to Visit  Medication Sig Dispense Refill   acetaminophen (TYLENOL 8 HOUR ARTHRITIS PAIN) 650 MG  CR tablet Take 650 mg by mouth every 8 (eight) hours as needed for pain.     albuterol (VENTOLIN HFA) 108 (90 Base) MCG/ACT inhaler Inhale 2 puffs into the lungs every 6 (six) hours as needed for wheezing or shortness of breath. 8 g 2   Apple Cid Vn-Grn Tea-Bit Or-Cr (APPLE CIDER VINEGAR PLUS) TABS Take 2 tablets by mouth 3 (three) times daily before meals.     aspirin-acetaminophen-caffeine (EXCEDRIN MIGRAINE) 250-250-65 MG tablet Take 1 tablet by mouth every 6 (six) hours as needed for headache.     b complex vitamins tablet Take 1 tablet by mouth as needed.      benzonatate (TESSALON) 100 MG capsule Take 1 capsule (100 mg total) by mouth 3 (three) times daily as needed for cough. 30 capsule 0   Cholecalciferol (VITAMIN D) 2000 units CAPS Take 1 capsule by mouth daily.     diphenhydramine-acetaminophen (TYLENOL PM) 25-500 MG TABS tablet Take 1 tablet by mouth at bedtime as needed.     ezetimibe (ZETIA) 10 MG tablet TAKE 1 TABLET BY MOUTH ONCE DAILY 90 tablet 0   famotidine (PEPCID) 20 MG tablet Take 20 mg by mouth as needed.      Fexofenadine HCl (ALLEGRA PO) Take by mouth daily.     gabapentin (NEURONTIN) 100 MG capsule Take 1 capsule (100 mg total) by mouth 3 (three) times daily. 90 capsule 6   HYDROcodone-acetaminophen (NORCO/VICODIN) 5-325 MG tablet Take 1 tablet by mouth every 6 (six) hours as needed for moderate pain. 20 tablet 0   Loperamide HCl (ANTI-DIARRHEAL PO) Take by mouth.     Magnesium 250 MG TABS Take 1 tablet by mouth daily.     MELATONIN PO Take 5-10 mg by mouth at bedtime.     MISC NATURAL PRODUCTS PO Take by mouth as needed. Bladder Control     mometasone (ELOCON) 0.1 % cream Apply 1 application topically daily. (Patient taking differently: Apply 1 application. topically as needed.) 45 g 0   Multiple Vitamin (MULTIVITAMIN) tablet Take 1 tablet by mouth daily.     potassium chloride (K-DUR) 10 MEQ tablet Take 1 tablet (10 mEq total) by mouth daily as needed. With lasix 30 tablet 2   predniSONE (DELTASONE) 20 MG tablet 2 tabs po daily for 5 days, then 1 tab po daily for 5 days 15 tablet 0   telmisartan (MICARDIS) 20 MG tablet Take 20 mg by mouth daily.     TURMERIC PO Take 1,000 mg by mouth daily.     BREO ELLIPTA 200-25 MCG/ACT AEPB INHALE 1 PUFF BY MOUTH EVERY DAY 60 each 0   furosemide (LASIX) 20 MG tablet TAKE 1 TABLET (20 MG TOTAL) BY MOUTH DAILY AS NEEDED FOR FLUID. 90 tablet 0   Ibuprofen-diphenhydrAMINE HCl (ADVIL PM) 200-25 MG CAPS Take 1 tablet by mouth at bedtime as needed. 28 capsule    metaxalone (SKELAXIN) 800 MG tablet  Take by mouth.     montelukast (SINGULAIR) 10 MG tablet TAKE 1 TABLET BY MOUTH EVERY DAY IN THE EVENING 90 tablet 0   SPIRIVA HANDIHALER 18 MCG inhalation capsule INHALE 1 CAPSULE VIA HANDIHALER ONCE DAILY AT THE SAME TIME EVERY DAY 30 capsule 11   azelastine (ASTELIN) 0.1 % nasal spray Place 1 spray into both nostrils 2 (two) times daily. Use in each nostril as directed (Patient not taking: Reported on 08/07/2021)     CETIRIZINE HCL PO Take 1 tablet by mouth as needed. (Patient not taking: Reported on  08/07/2021)     fluticasone (FLONASE) 50 MCG/ACT nasal spray Place 1 spray into both nostrils 2 (two) times daily. (Patient not taking: Reported on 08/07/2021) 16 g 0   Ibuprofen-diphenhydrAMINE Cit (ADVIL PM PO) Take 1 tablet by mouth as needed (sleep). She does not use in combination with melatonin.     No facility-administered medications prior to visit.     Per HPI unless specifically indicated in ROS section below Review of Systems  Constitutional:  Negative for activity change, appetite change, chills, fatigue, fever and unexpected weight change.  HENT:  Negative for hearing loss.   Eyes:  Negative for visual disturbance.  Respiratory:  Positive for shortness of breath (COPD related). Negative for cough, chest tightness and wheezing.   Cardiovascular:  Negative for chest pain, palpitations and leg swelling.  Gastrointestinal:  Positive for diarrhea and nausea (occ). Negative for abdominal distention, abdominal pain, blood in stool, constipation and vomiting.  Genitourinary:  Negative for difficulty urinating and hematuria.  Musculoskeletal:  Negative for arthralgias, myalgias and neck pain.  Skin:  Negative for rash.  Neurological:  Negative for dizziness, seizures, syncope and headaches.  Hematological:  Negative for adenopathy. Does not bruise/bleed easily.  Psychiatric/Behavioral:  Negative for dysphoric mood. The patient is not nervous/anxious.    Objective:  BP (!) 154/84 (BP Location:  Right Arm, Cuff Size: Large)    Pulse 70    Temp 97.6 F (36.4 C) (Temporal)    Ht 5' 3.5" (1.613 m)    Wt 218 lb 8 oz (99.1 kg)    SpO2 94%    BMI 38.10 kg/m   Wt Readings from Last 3 Encounters:  09/07/21 218 lb 8 oz (99.1 kg)  08/07/21 215 lb (97.5 kg)  08/24/20 225 lb (102.1 kg)      Physical Exam Vitals and nursing note reviewed.  Constitutional:      Appearance: Normal appearance. She is not ill-appearing.  HENT:     Head: Normocephalic and atraumatic.     Right Ear: Tympanic membrane, ear canal and external ear normal. There is no impacted cerumen.     Left Ear: Tympanic membrane, ear canal and external ear normal. There is no impacted cerumen.  Eyes:     General:        Right eye: No discharge.        Left eye: No discharge.     Extraocular Movements: Extraocular movements intact.     Conjunctiva/sclera: Conjunctivae normal.     Pupils: Pupils are equal, round, and reactive to light.  Neck:     Thyroid: No thyroid mass or thyromegaly.     Vascular: No carotid bruit.  Cardiovascular:     Rate and Rhythm: Normal rate and regular rhythm.     Pulses: Normal pulses.     Heart sounds: Murmur (3/6 systolic USB) heard.  Pulmonary:     Effort: Pulmonary effort is normal. No respiratory distress.     Breath sounds: Normal breath sounds. No wheezing, rhonchi or rales.  Abdominal:     General: Bowel sounds are normal. There is no distension.     Palpations: Abdomen is soft. There is no mass.     Tenderness: There is no abdominal tenderness. There is no guarding or rebound.     Hernia: No hernia is present.  Musculoskeletal:        General: Swelling present.     Cervical back: Normal range of motion and neck supple. No rigidity.     Right  lower leg: No edema.     Left lower leg: No edema.     Comments: Swelling to L 4th finger PIP  Lymphadenopathy:     Cervical: No cervical adenopathy.  Skin:    General: Skin is warm and dry.     Findings: No rash.  Neurological:      General: No focal deficit present.     Mental Status: She is alert. Mental status is at baseline.  Psychiatric:        Mood and Affect: Mood normal.        Behavior: Behavior normal.      Results for orders placed or performed in visit on 08/31/21  CBC with Differential/Platelet  Result Value Ref Range   WBC 6.4 4.0 - 10.5 K/uL   RBC 4.18 3.87 - 5.11 Mil/uL   Hemoglobin 12.7 12.0 - 15.0 g/dL   HCT 37.8 36.0 - 46.0 %   MCV 90.5 78.0 - 100.0 fl   MCHC 33.6 30.0 - 36.0 g/dL   RDW 14.3 11.5 - 15.5 %   Platelets 243.0 150.0 - 400.0 K/uL   Neutrophils Relative % 55.4 43.0 - 77.0 %   Lymphocytes Relative 33.3 12.0 - 46.0 %   Monocytes Relative 7.0 3.0 - 12.0 %   Eosinophils Relative 3.5 0.0 - 5.0 %   Basophils Relative 0.8 0.0 - 3.0 %   Neutro Abs 3.5 1.4 - 7.7 K/uL   Lymphs Abs 2.1 0.7 - 4.0 K/uL   Monocytes Absolute 0.4 0.1 - 1.0 K/uL   Eosinophils Absolute 0.2 0.0 - 0.7 K/uL   Basophils Absolute 0.1 0.0 - 0.1 K/uL  TSH  Result Value Ref Range   TSH 2.88 0.35 - 5.50 uIU/mL  Comprehensive metabolic panel  Result Value Ref Range   Sodium 143 135 - 145 mEq/L   Potassium 4.2 3.5 - 5.1 mEq/L   Chloride 106 96 - 112 mEq/L   CO2 31 19 - 32 mEq/L   Glucose, Bld 97 70 - 99 mg/dL   BUN 18 6 - 23 mg/dL   Creatinine, Ser 0.94 0.40 - 1.20 mg/dL   Total Bilirubin 0.6 0.2 - 1.2 mg/dL   Alkaline Phosphatase 60 39 - 117 U/L   AST 17 0 - 37 U/L   ALT 15 0 - 35 U/L   Total Protein 7.1 6.0 - 8.3 g/dL   Albumin 4.2 3.5 - 5.2 g/dL   GFR 58.54 (L) >60.00 mL/min   Calcium 9.3 8.4 - 10.5 mg/dL  Lipid panel  Result Value Ref Range   Cholesterol 208 (H) 0 - 200 mg/dL   Triglycerides 79.0 0.0 - 149.0 mg/dL   HDL 75.60 >39.00 mg/dL   VLDL 15.8 0.0 - 40.0 mg/dL   LDL Cholesterol 117 (H) 0 - 99 mg/dL   Total CHOL/HDL Ratio 3    NonHDL 132.54   VITAMIN D 25 Hydroxy (Vit-D Deficiency, Fractures)  Result Value Ref Range   VITD 28.39 (L) 30.00 - 100.00 ng/mL    Assessment & Plan:  This visit  occurred during the SARS-CoV-2 public health emergency.  Safety protocols were in place, including screening questions prior to the visit, additional usage of staff PPE, and extensive cleaning of exam room while observing appropriate contact time as indicated for disinfecting solutions.   Problem List Items Addressed This Visit     Advanced care planning/counseling discussion (Chronic)    Advanced directive: reviewed with patient - son is HCPOA. Has also discussed with local cousin. Does not  want to complete advanced directive but wants to be full code, but no prolonged life support if terminal condition. Encouraged she talk about wishes with son.       Health maintenance examination - Primary (Chronic)    Preventative protocols reviewed and updated unless pt declined. Discussed healthy diet and lifestyle.       Ex-smoker    Quit smoking 2009.  Continues yearly lung cancer screening CT.       Centrilobular emphysema (HCC)    Continue spiriva and breo ellipta.      Relevant Medications   montelukast (SINGULAIR) 10 MG tablet   fluticasone furoate-vilanterol (BREO ELLIPTA) 200-25 MCG/ACT AEPB   tiotropium (SPIRIVA HANDIHALER) 18 MCG inhalation capsule   Severe obesity (BMI 35.0-39.9) with comorbidity (HCC)   Hyperlipidemia    Chronic, stable on ezetimibe. Statin intolerance (myalgias to crestor). The 10-year ASCVD risk score (Arnett DK, et al., 2019) is: 35.9%   Values used to calculate the score:     Age: 62 years     Sex: Female     Is Non-Hispanic African American: No     Diabetic: No     Tobacco smoker: No     Systolic Blood Pressure: 258 mmHg     Is BP treated: Yes     HDL Cholesterol: 75.6 mg/dL     Total Cholesterol: 208 mg/dL       Relevant Medications   furosemide (LASIX) 20 MG tablet   Essential hypertension    Chronic, uncontrolled. She endorses h/o white coat hypertension. I asked her take her lasix when she gets home, continue monitoring blood pressures at home,  and let us know if consistently elevated to titrate antihypertensives.       Relevant Medications   furosemide (LASIX) 20 MG tablet   Coronary artery calcification seen on CAT scan    Sees cardiology.       Relevant Medications   furosemide (LASIX) 20 MG tablet   Carotid stenosis, asymptomatic, bilateral   Relevant Medications   furosemide (LASIX) 20 MG tablet   Aortic valve sclerosis   Relevant Medications   furosemide (LASIX) 20 MG tablet   Statin myopathy   Osteopenia    Vit D borderline low. Need to ensure good calcium in diet.       Aortic atherosclerosis (HCC)    Continue ezetimibe.       Relevant Medications   furosemide (LASIX) 20 MG tablet   Psoriasis    Endorses h/o this. No recent flare.       Arthralgia of hand    Pain to bilateral hands, with evident swelling at L 4th PIP joint. No nail changes. No recent psoriasis flare. Offered rheum eval - she will consider this.         Meds ordered this encounter  Medications   furosemide (LASIX) 20 MG tablet    Sig: Take 1 tablet (20 mg total) by mouth daily as needed for fluid.    Dispense:  30 tablet    Refill:  0   montelukast (SINGULAIR) 10 MG tablet    Sig: TAKE 1 TABLET BY MOUTH EVERY DAY IN THE EVENING    Dispense:  90 tablet    Refill:  3   fluticasone furoate-vilanterol (BREO ELLIPTA) 200-25 MCG/ACT AEPB    Sig: INHALE 1 PUFF BY MOUTH EVERY DAY    Dispense:  60 each    Refill:  11    Hold until next refill due   tiotropium (SPIRIVA  HANDIHALER) 18 MCG inhalation capsule    Sig: INHALE 1 CAPSULE VIA HANDIHALER ONCE DAILY AT THE SAME TIME EVERY DAY    Dispense:  30 capsule    Refill:  11   ibuprofen (ADVIL) 200 MG tablet    Sig: Take 3-4 tablets (600-800 mg total) by mouth daily as needed.   No orders of the defined types were placed in this encounter.   Patient instructions: We will request records of latest mammogram from [facility] imaging.  Take lasix today when you get home. Start monitoring   blood pressure regularly at home, let me know readings in 1-2 weeks. if consistently elevated we will titrate blood pressure medicines. Let us know if you'd like to see the rheumatologist to evaluate for psoriatic arthritis.  Return as needed or in 1 year for next wellness visit/physical  Follow up plan: Return in about 1 year (around 09/08/2022) for annual exam, prior fasting for blood work, medicare wellness visit.  Ria Bush, MD

## 2021-09-07 NOTE — Assessment & Plan Note (Signed)
Preventative protocols reviewed and updated unless pt declined. Discussed healthy diet and lifestyle.  

## 2021-09-07 NOTE — Patient Instructions (Addendum)
We will request records of latest mammogram from [facility] imaging.  ?Take lasix today when you get home. Start monitoring  blood pressure regularly at home, let me know readings in 1-2 weeks. if consistently elevated we will titrate blood pressure medicines. ?Let us know if you'd like to see the rheumatologist to evaluate for psoriatic arthritis.  ?Return as needed or in 1 year for next wellness visit/physical ? ?Health Maintenance After Age 25 ?After age 45, you are at a higher risk for certain long-term diseases and infections as well as injuries from falls. Falls are a major cause of broken bones and head injuries in people who are older than age 27. Getting regular preventive care can help to keep you healthy and well. Preventive care includes getting regular testing and making lifestyle changes as recommended by your health care provider. Talk with your health care provider about: ?Which screenings and tests you should have. A screening is a test that checks for a disease when you have no symptoms. ?A diet and exercise plan that is right for you. ?What should I know about screenings and tests to prevent falls? ?Screening and testing are the best ways to find a health problem early. Early diagnosis and treatment give you the best chance of managing medical conditions that are common after age 62. Certain conditions and lifestyle choices may make you more likely to have a fall. Your health care provider may recommend: ?Regular vision checks. Poor vision and conditions such as cataracts can make you more likely to have a fall. If you wear glasses, make sure to get your prescription updated if your vision changes. ?Medicine review. Work with your health care provider to regularly review all of the medicines you are taking, including over-the-counter medicines. Ask your health care provider about any side effects that may make you more likely to have a fall. Tell your health care provider if any medicines that you take  make you feel dizzy or sleepy. ?Strength and balance checks. Your health care provider may recommend certain tests to check your strength and balance while standing, walking, or changing positions. ?Foot health exam. Foot pain and numbness, as well as not wearing proper footwear, can make you more likely to have a fall. ?Screenings, including: ?Osteoporosis screening. Osteoporosis is a condition that causes the bones to get weaker and break more easily. ?Blood pressure screening. Blood pressure changes and medicines to control blood pressure can make you feel dizzy. ?Depression screening. You may be more likely to have a fall if you have a fear of falling, feel depressed, or feel unable to do activities that you used to do. ?Alcohol use screening. Using too much alcohol can affect your balance and may make you more likely to have a fall. ?Follow these instructions at home: ?Lifestyle ?Do not drink alcohol if: ?Your health care provider tells you not to drink. ?If you drink alcohol: ?Limit how much you have to: ?0-1 drink a day for women. ?0-2 drinks a day for men. ?Know how much alcohol is in your drink. In the [place] one drink equals one 12 oz bottle of beer (355 mL), one 5 oz glass of wine (148 mL), or one 1? oz glass of hard liquor (44 mL). ?Do not use any products that contain nicotine or tobacco. These products include cigarettes, chewing tobacco, and vaping devices, such as e-cigarettes. If you need help quitting, ask your health care provider. ?Activity ? ?Follow a regular exercise program to stay fit. This will help  you maintain your balance. Ask your health care provider what types of exercise are appropriate for you. ?If you need a cane or walker, use it as recommended by your health care provider. ?Wear supportive shoes that have nonskid soles. ?Safety ? ?Remove any tripping hazards, such as rugs, cords, and clutter. ?Install safety equipment such as grab bars in bathrooms and safety rails on stairs. ?Keep  rooms and walkways well-lit. ?General instructions ?Talk with your health care provider about your risks for falling. Tell your health care provider if: ?You fall. Be sure to tell your health care provider about all falls, even ones that seem minor. ?You feel dizzy, tiredness (fatigue), or off-balance. ?Take over-the-counter and prescription medicines only as told by your health care provider. These include supplements. ?Eat a healthy diet and maintain a healthy weight. A healthy diet includes low-fat dairy products, low-fat (lean) meats, and fiber from whole grains, beans, and lots of fruits and vegetables. ?Stay current with your vaccines. ?Schedule regular health, dental, and eye exams. ?Summary ?Having a healthy lifestyle and getting preventive care can help to protect your health and wellness after age 68. ?Screening and testing are the best way to find a health problem early and help you avoid having a fall. Early diagnosis and treatment give you the best chance for managing medical conditions that are more common for people who are older than age 39. ?Falls are a major cause of broken bones and head injuries in people who are older than age 39. Take precautions to prevent a fall at home. ?Work with your health care provider to learn what changes you can make to improve your health and wellness and to prevent falls. ?This information is not intended to replace advice given to you by your health care provider. Make sure you discuss any questions you have with your health care provider. ?Document Revised: 11/06/2020 Document Reviewed: 11/06/2020 ?Elsevier Patient Education ? [place]. ? ?

## 2021-09-07 NOTE — Assessment & Plan Note (Signed)
Advanced directive: reviewed with patient - son is HCPOA. Has also discussed with local cousin. Does not want to complete advanced directive but wants to be full code, but no prolonged life support if terminal condition. Encouraged she talk about wishes with son.  ?

## 2021-09-08 ENCOUNTER — Encounter: Payer: Self-pay | Admitting: Family Medicine

## 2021-09-08 DIAGNOSIS — M25549 Pain in joints of unspecified hand: Secondary | ICD-10-CM | POA: Insufficient documentation

## 2021-09-08 DIAGNOSIS — L409 Psoriasis, unspecified: Secondary | ICD-10-CM | POA: Insufficient documentation

## 2021-09-08 NOTE — Assessment & Plan Note (Signed)
Quit smoking 2009.  ?Continues yearly lung cancer screening CT.  ?

## 2021-09-08 NOTE — Assessment & Plan Note (Signed)
Chronic, stable on ezetimibe. Statin intolerance (myalgias to crestor). ?The 10-year ASCVD risk score (Arnett DK, et al., 2019) is: 35.9% ?  Values used to calculate the score: ?    Age: 76 years ?    Sex: Female ?    Is Non-Hispanic African American: No ?    Diabetic: No ?    Tobacco smoker: No ?    Systolic Blood Pressure: 527 mmHg ?    Is BP treated: Yes ?    HDL Cholesterol: 75.6 mg/dL ?    Total Cholesterol: 208 mg/dL  ?

## 2021-09-08 NOTE — Assessment & Plan Note (Signed)
Continue ezetimibe.  ?

## 2021-09-08 NOTE — Assessment & Plan Note (Signed)
Sees cardiology 

## 2021-09-08 NOTE — Assessment & Plan Note (Signed)
Vit D borderline low. Need to ensure good calcium in diet.  ?

## 2021-09-08 NOTE — Assessment & Plan Note (Signed)
Endorses h/o this. No recent flare.  ?

## 2021-09-08 NOTE — Assessment & Plan Note (Signed)
Chronic, uncontrolled. She endorses h/o white coat hypertension. I asked her take her lasix when she gets home, continue monitoring blood pressures at home, and let us know if consistently elevated to titrate antihypertensives.  ?

## 2021-09-08 NOTE — Assessment & Plan Note (Addendum)
Continue spiriva and breo ellipta. ?

## 2021-09-08 NOTE — Assessment & Plan Note (Signed)
Pain to bilateral hands, with evident swelling at L 4th PIP joint. No nail changes. No recent psoriasis flare. Offered rheum eval - she will consider this.  ?

## 2021-09-18 ENCOUNTER — Encounter: Payer: Self-pay | Admitting: Family Medicine

## 2021-10-02 ENCOUNTER — Other Ambulatory Visit: Payer: Self-pay | Admitting: Family Medicine

## 2021-10-06 ENCOUNTER — Other Ambulatory Visit: Payer: Self-pay | Admitting: Family Medicine

## 2021-11-09 ENCOUNTER — Encounter: Payer: Self-pay | Admitting: Family Medicine

## 2021-11-09 ENCOUNTER — Ambulatory Visit (INDEPENDENT_AMBULATORY_CARE_PROVIDER_SITE_OTHER): Payer: Medicare Other | Admitting: Family Medicine

## 2021-11-09 VITALS — BP 138/70 | HR 73 | Temp 97.6°F | Ht 63.5 in | Wt 218.1 lb

## 2021-11-09 DIAGNOSIS — J0100 Acute maxillary sinusitis, unspecified: Secondary | ICD-10-CM | POA: Diagnosis not present

## 2021-11-09 DIAGNOSIS — R29898 Other symptoms and signs involving the musculoskeletal system: Secondary | ICD-10-CM | POA: Diagnosis not present

## 2021-11-09 DIAGNOSIS — R200 Anesthesia of skin: Secondary | ICD-10-CM

## 2021-11-09 DIAGNOSIS — M5412 Radiculopathy, cervical region: Secondary | ICD-10-CM | POA: Diagnosis not present

## 2021-11-09 MED ORDER — FLUCONAZOLE 150 MG PO TABS: 150.0000 mg | ORAL_TABLET | Freq: Once | ORAL | 0 refills | Status: AC

## 2021-11-09 MED ORDER — AMOXICILLIN-POT CLAVULANATE 875-125 MG PO TABS: 1.0000 | ORAL_TABLET | Freq: Two times a day (BID) | ORAL | 0 refills | Status: AC

## 2021-11-09 NOTE — Progress Notes (Signed)
? ? Patient ID: Monica Wise, female    DOB: [birth year], 76 y.o.   MRN: [id number] ? ?This visit was conducted in person. ? ?BP 138/70   Pulse 73   Temp 97.6 ?F (36.4 ?C) (Temporal)   Ht 5' 3.5" (1.613 m)   Wt 218 lb 2 oz (98.9 kg)   SpO2 95%   BMI 38.03 kg/m?   ? ?CC: sinus congestion ?Subjective:  ? ?HPI: ?Monica Wise is a 76 y.o. female presenting on 11/09/2021 for Sinus Problem (C/o sneezing, ST, sinus congestion and facial pain.  Sxs started about 1 wk ago.  Tried allergy pill, helpful with drainage. ), Numbness (C/o worsening numbness in L toes and ball of foot. Requests neuro referral in [place]. ), and Fatigue (C/o feeling tired all the time and wanting to sleep more. ) ? ? ?R handed  ?Highly claustrophobic - this makes advanced imaging difficult.  ? ?1+ wk h/o sneezing, ST, congestion, swollen glands, facial pain, headache.  ?No fevers/chills, abd pain, nausea, diarrhea, body aches, wheezing, ear or tooth pain.  ?No sick contacts at home.  ?Treating with ibuprofen and allergy pill and guaifenesin, continues singulair.  ?She continues breo ellipta, has not needed albuterol inh.  ? ?States zpacks don't work.  ? ?Toes and sole of left foot remain numb for the past several months - this may be progressing. Prolonged standing/walking aggravates this. H/o 2 lower lumbar herniated discs - no change in pain recently.  ?No new paresthesias to hands, no leg or lower back pain. No burning pain.  ?Lab Results  ?Component Value Date  ? [id number] 505 11/09/2021  ?  ?She also notes worsening fatigue, daytime sleepiness.  ?Poor sleep at night time.  ?She would desire to establish with [facility] due to prpoximity if needed.  ? ?She has regularly been taking B6 and B12 vitamins.  ? ?H/o fall 07/2020 with residual L hand weakness. At that time saw [facility] then Dr Lorelei Pont sports medicine, referred to [facility] neuro. 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 She declined MRI imaging and  declined neurosurgical consultation at that time, L hand weakness did improve over time.  ? ?08/2020 - CT cervical spine without contrast IMPRESSION: ?1. No acute fracture or static subluxation of the cervical spine. ?2. 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 ?3. No spinal canal stenosis. ?4. Reversal of normal cervical lordosis may be positional or due to muscle spasm.  ? ?08/2020 NCS/EMG Conclusion: ?This is an abnormal study. There is electrodiagnostic evidence of left T1, C8 more than C7 radiculopathy. In addition, there is evidence of moderate right carpal tunnel syndromes ?   ? ?Relevant past medical, surgical, family and social history reviewed and updated as indicated. Interim medical history since our last visit reviewed. ?Allergies and medications reviewed and updated. ?Outpatient Medications Prior to Visit  ?Medication Sig Dispense Refill  ? acetaminophen (TYLENOL) 650 MG CR tablet Take 650 mg by mouth every 8 (eight) hours as needed for pain.    ? albuterol (VENTOLIN HFA) 108 (90 Base) MCG/ACT inhaler Inhale 2 puffs into the lungs every 6 (six) hours as needed for wheezing or shortness of breath. 8 g 2  ? Apple Cid Vn-Grn Tea-Bit Or-Cr (APPLE CIDER VINEGAR PLUS) TABS Take 2 tablets by mouth 3 (three) times daily before meals.    ? aspirin-acetaminophen-caffeine (EXCEDRIN MIGRAINE) 250-250-65 MG tablet Take 1 tablet by mouth every 6 (six) hours as needed  for headache.    ? Cholecalciferol (VITAMIN D) 2000 units CAPS Take 1 capsule by mouth daily.    ? diphenhydramine-acetaminophen (TYLENOL PM) 25-500 MG TABS tablet Take 1 tablet by mouth at bedtime as needed.    ? ezetimibe (ZETIA) 10 MG tablet TAKE 1 TABLET BY MOUTH ONCE DAILY 90 tablet 0  ? famotidine (PEPCID) 20 MG tablet Take 20 mg by mouth as needed.     ? Fexofenadine HCl (ALLEGRA PO) Take by mouth daily.    ? fluticasone furoate-vilanterol (BREO ELLIPTA) 200-25 MCG/ACT AEPB INHALE 1 PUFF BY MOUTH EVERY DAY 60 each 11  ?  furosemide (LASIX) 20 MG tablet TAKE 1 TABLET (20 MG TOTAL) BY MOUTH DAILY AS NEEDED FOR FLUID. 30 tablet 0  ? HYDROcodone-acetaminophen (NORCO/VICODIN) 5-325 MG tablet Take 1 tablet by mouth every 6 (six) hours as needed for moderate pain. 20 tablet 0  ? ibuprofen (ADVIL) 200 MG tablet Take 3-4 tablets (600-800 mg total) by mouth daily as needed.    ? Loperamide HCl (ANTI-DIARRHEAL PO) Take by mouth.    ? Magnesium 250 MG TABS Take 1 tablet by mouth daily.    ? MELATONIN PO Take 5-10 mg by mouth at bedtime.    ? MISC NATURAL PRODUCTS PO Take by mouth as needed. Bladder Control    ? mometasone (ELOCON) 0.1 % cream Apply 1 application topically daily. (Patient taking differently: Apply 1 application. topically as needed.) 45 g 0  ? montelukast (SINGULAIR) 10 MG tablet TAKE 1 TABLET BY MOUTH EVERY DAY IN THE EVENING 90 tablet 3  ? Multiple Vitamin (MULTIVITAMIN) tablet Take 1 tablet by mouth daily.    ? potassium chloride (K-DUR) 10 MEQ tablet Take 1 tablet (10 mEq total) by mouth daily as needed. With lasix 30 tablet 2  ? telmisartan (MICARDIS) 20 MG tablet Take 20 mg by mouth daily.    ? tiotropium (SPIRIVA HANDIHALER) 18 MCG inhalation capsule INHALE 1 CAPSULE VIA HANDIHALER ONCE DAILY AT THE SAME TIME EVERY DAY 30 capsule 11  ? TURMERIC PO Take 1,000 mg by mouth daily.    ? b complex vitamins tablet Take 1 tablet by mouth as needed.    ? benzonatate (TESSALON) 100 MG capsule Take 1 capsule (100 mg total) by mouth 3 (three) times daily as needed for cough. 30 capsule 0  ? gabapentin (NEURONTIN) 100 MG capsule Take 1 capsule (100 mg total) by mouth 3 (three) times daily. 90 capsule 6  ? predniSONE (DELTASONE) 20 MG tablet 2 tabs po daily for 5 days, then 1 tab po daily for 5 days 15 tablet 0  ? ?No facility-administered medications prior to visit.  ?  ? ?Per HPI unless specifically indicated in ROS section below ?Review of Systems ? ?Objective:  ?BP 138/70   Pulse 73   Temp 97.6 ?F (36.4 ?C) (Temporal)   Ht 5'  3.5" (1.613 m)   Wt 218 lb 2 oz (98.9 kg)   SpO2 95%   BMI 38.03 kg/m?   ?Wt Readings from Last 3 Encounters:  ?11/09/21 218 lb 2 oz (98.9 kg)  ?09/07/21 218 lb 8 oz (99.1 kg)  ?08/07/21 215 lb (97.5 kg)  ?  ?  ?Physical Exam ?Vitals and nursing note reviewed.  ?Constitutional:   ?   Appearance: Normal appearance. She is not ill-appearing.  ?HENT:  ?   Head: Normocephalic and atraumatic.  ?   Right Ear: Tympanic membrane, ear canal and external ear normal. There is no impacted cerumen.  ?  Left Ear: Tympanic membrane, ear canal and external ear normal. There is no impacted cerumen.  ?   Nose: Congestion and rhinorrhea present.  ?   Right Turbinates: Swollen. Not enlarged.  ?   Left Turbinates: Swollen. Not enlarged.  ?   Right Sinus: Maxillary sinus tenderness present. No frontal sinus tenderness.  ?   Left Sinus: Maxillary sinus tenderness present. No frontal sinus tenderness.  ?   Mouth/Throat:  ?   Mouth: Mucous membranes are moist.  ?   Pharynx: Oropharynx is clear. No oropharyngeal exudate or posterior oropharyngeal erythema.  ?Eyes:  ?   Extraocular Movements: Extraocular movements intact.  ?   Conjunctiva/sclera: Conjunctivae normal.  ?   Pupils: Pupils are equal, round, and reactive to light.  ?Cardiovascular:  ?   Rate and Rhythm: Normal rate and regular rhythm.  ?   Pulses: Normal pulses.  ?   Heart sounds: Murmur (3/6 systolic USB) heard.  ?Pulmonary:  ?   Effort: Pulmonary effort is normal. No respiratory distress.  ?   Breath sounds: Normal breath sounds. No wheezing, rhonchi or rales.  ?Musculoskeletal:     ?   General: Normal range of motion.  ?   Cervical back: Normal range of motion and neck supple.  ?   Right lower leg: No edema.  ?   Left lower leg: No edema.  ?   Comments: No midline lumbar spine pain  ?Lymphadenopathy:  ?   Head:  ?   Right side of head: No submental, submandibular, tonsillar, preauricular or posterior auricular adenopathy.  ?   Left side of head: No submental,  submandibular, tonsillar, preauricular or posterior auricular adenopathy.  ?   Cervical: No cervical adenopathy.  ?   Right cervical: No superficial cervical adenopathy. ?   Left cervical: No superficial cervical adenopathy.  ?   Upper

## 2021-11-09 NOTE — Patient Instructions (Addendum)
Labs today. If normal, we will refer you to neurologist for evaluation of left foot numbness.  ?You have a sinus infection. ?Take medicine as prescribed: augmentin 10 day course ?Push fluids and plenty of rest. ?Nasal saline irrigation or neti pot to help drain sinuses. ?May use plain guaifenesin (mucinex) with plenty of fluid to help mobilize mucous.  ?Please let us know if fever >101.5, trouble opening/closing mouth, difficulty swallowing, or worsening instead of improving as expected.  ?

## 2021-11-10 DIAGNOSIS — J019 Acute sinusitis, unspecified: Secondary | ICD-10-CM | POA: Insufficient documentation

## 2021-11-10 DIAGNOSIS — R200 Anesthesia of skin: Secondary | ICD-10-CM | POA: Insufficient documentation

## 2021-11-10 MED ORDER — VITAMIN B-12 1000 MCG PO TABS: 1000.0000 ug | ORAL_TABLET | Freq: Every day | ORAL | Status: DC

## 2021-11-10 MED ORDER — VITAMIN B-6 50 MG PO TABS: 50.0000 mg | ORAL_TABLET | Freq: Every day | ORAL | Status: DC

## 2021-11-10 NOTE — Assessment & Plan Note (Addendum)
NCS and CT cervical spine concerning for cervical cause of L hand pain/weakness - this has improved at this time but she notes residual L hand weakness.  ?

## 2021-11-10 NOTE — Assessment & Plan Note (Signed)
Predominantly L foot numbness without significant neuropathic pain, present over the past several months. Will start with B12, B6 check and if normal, discussed possible referral to [facility] neurology for further evaluation.  ?

## 2021-11-10 NOTE — Assessment & Plan Note (Signed)
Treat for bacterial sinusitis given duration/progression of symptoms.  ?Rx augmentin. She states Zpacks previously have not been helpful.  ?Further supportive measures reviewed. She prefers not to use INS.  ?

## 2021-11-12 ENCOUNTER — Telehealth: Payer: Self-pay

## 2021-11-12 NOTE — Telephone Encounter (Signed)
Pt wants to know what sleep aid she can take with the amoxicillin.  Gives permission to lvm with Dr. Synthia Innocent response.  ?

## 2021-11-12 NOTE — Telephone Encounter (Addendum)
She has 2 OTC meds on her med list for sleep - melatonin and tylenol PM.  ?Would be ok to take either with amoxicillin, would start with her melatonin 5-'10mg'$  nightly as needed for sleep.  ?

## 2021-11-12 NOTE — Telephone Encounter (Signed)
Spoke with pt relaying Dr. G's message.  Pt verbalizes understanding and expresses her thanks.  

## 2021-11-16 LAB — VITAMIN B6: Vitamin B6: 84.5 ng/mL — ABNORMAL HIGH (ref 2.1–21.7)

## 2021-11-16 LAB — VITAMIN B12: Vitamin B-12: 505 pg/mL (ref 200–1100)

## 2021-11-17 ENCOUNTER — Other Ambulatory Visit: Payer: Self-pay | Admitting: Family Medicine

## 2021-11-17 DIAGNOSIS — R200 Anesthesia of skin: Secondary | ICD-10-CM

## 2021-11-17 DIAGNOSIS — R29898 Other symptoms and signs involving the musculoskeletal system: Secondary | ICD-10-CM

## 2021-11-17 DIAGNOSIS — M5412 Radiculopathy, cervical region: Secondary | ICD-10-CM

## 2021-12-03 ENCOUNTER — Other Ambulatory Visit: Payer: Self-pay | Admitting: Family Medicine

## 2021-12-11 DIAGNOSIS — J302 Other seasonal allergic rhinitis: Secondary | ICD-10-CM | POA: Diagnosis not present

## 2021-12-11 DIAGNOSIS — H6122 Impacted cerumen, left ear: Secondary | ICD-10-CM | POA: Diagnosis not present

## 2021-12-11 DIAGNOSIS — H6983 Other specified disorders of Eustachian tube, bilateral: Secondary | ICD-10-CM | POA: Diagnosis not present

## 2022-01-10 DIAGNOSIS — G629 Polyneuropathy, unspecified: Secondary | ICD-10-CM | POA: Diagnosis not present

## 2022-01-10 DIAGNOSIS — R531 Weakness: Secondary | ICD-10-CM | POA: Diagnosis not present

## 2022-01-10 DIAGNOSIS — R200 Anesthesia of skin: Secondary | ICD-10-CM | POA: Diagnosis not present

## 2022-01-21 DIAGNOSIS — S0501XA Injury of conjunctiva and corneal abrasion without foreign body, right eye, initial encounter: Secondary | ICD-10-CM | POA: Diagnosis not present

## 2022-01-22 DIAGNOSIS — J302 Other seasonal allergic rhinitis: Secondary | ICD-10-CM | POA: Diagnosis not present

## 2022-01-22 DIAGNOSIS — H6983 Other specified disorders of Eustachian tube, bilateral: Secondary | ICD-10-CM | POA: Diagnosis not present

## 2022-01-23 DIAGNOSIS — S0502XD Injury of conjunctiva and corneal abrasion without foreign body, left eye, subsequent encounter: Secondary | ICD-10-CM | POA: Diagnosis not present

## 2022-03-05 ENCOUNTER — Other Ambulatory Visit: Payer: Self-pay | Admitting: Family Medicine

## 2022-03-05 ENCOUNTER — Telehealth: Payer: Self-pay | Admitting: Family Medicine

## 2022-03-05 NOTE — Telephone Encounter (Signed)
  Encourage patient to contact the pharmacy for refills or they can request refills through [facility]  Did the patient contact the pharmacy: y    LAST APPOINTMENT DATE: 11/09/21  NEXT APPOINTMENT DATE:  MEDICATION: telmisartan (MICARDIS) 20 MG tablet  Is the patient out of medication? y  If not, how much is left?  Is this a 90 day supply:   [id number] [place]  [phone] Fax:  [phone]     Let patient know to contact pharmacy at the end of the day to make sure medication is ready.  Please notify patient to allow 48-72 hours to process

## 2022-03-06 NOTE — Telephone Encounter (Signed)
E-scribed refill 

## 2022-04-01 ENCOUNTER — Ambulatory Visit (INDEPENDENT_AMBULATORY_CARE_PROVIDER_SITE_OTHER): Payer: Medicare Other | Admitting: Family Medicine

## 2022-04-01 ENCOUNTER — Encounter: Payer: Self-pay | Admitting: Family Medicine

## 2022-04-01 VITALS — BP 140/70 | HR 76 | Temp 98.1°F | Ht 62.5 in | Wt 224.4 lb

## 2022-04-01 DIAGNOSIS — R059 Cough, unspecified: Secondary | ICD-10-CM

## 2022-04-01 DIAGNOSIS — U071 COVID-19: Secondary | ICD-10-CM

## 2022-04-01 LAB — POC COVID19 BINAXNOW: SARS Coronavirus 2 Ag: POSITIVE — AB

## 2022-04-01 MED ORDER — MOLNUPIRAVIR EUA 200MG CAPSULE: 4.0000 | ORAL_CAPSULE | Freq: Two times a day (BID) | ORAL | 0 refills | Status: AC

## 2022-04-01 MED ORDER — BENZONATATE 200 MG PO CAPS: 200.0000 mg | ORAL_CAPSULE | Freq: Three times a day (TID) | ORAL | 1 refills | Status: DC | PRN

## 2022-04-01 NOTE — Progress Notes (Signed)
Monica Mcmeen T. Wing Gfeller, MD, [place] at [facility] [place], [address]  Phone: [phone]  FAX: [phone]  Monica Wise - 76 y.o. female  MRN [id number]  Date of Birth: [birth year]  Date: 04/01/2022  PCP: Ria Bush, MD  Referral: Ria Bush, MD  Chief Complaint  Patient presents with   Cough    Down in chest-Dry-Symptoms started last Wednesday   Subjective:   Monica Wise is a 76 y.o. very pleasant female patient with Body mass index is 40.38 kg/m. who presents with the following:  Patient presents with predominant cough that is been quite productive.  She also has some nasal congestion and runny nose, and she also has had a sore throat.  She denies fever, and she has not been particularly aching all over.  She denies GI complaints including no nausea, vomiting, diarrhea.  She does not know of a confirmed COVID contact, but she has been around multiple family members.  Right now feels hot, coughing and down in her chest.  Congested and runny nose.  Not aching at all, but will hurt in her chest.   No fever.  Coughing and has a sore throat.   Review of Systems is noted in the HPI, as appropriate  Objective:   BP (!) 140/70   Pulse 76   Temp 98.1 F (36.7 C) (Oral)   Ht 5' 2.5" (1.588 m)   Wt 224 lb 6 oz (101.8 kg)   SpO2 92%   BMI 40.38 kg/m    Gen: WDWN, cooperative. Globally Non-toxic HEENT: Normocephalic and atraumatic. Throat clear, w/o exudate, R TM clear, L TM - good landmarks, No fluid present. rhinnorhea. No frontal or maxillary sinus T. MMM NECK: Anterior cervical  LAD is present CV: RRR, No M/G/R, cap refill <2 sec PULM: Breathing comfortably in no respiratory distress. no wheezing, crackles, rhonchi ABD: S,NT,ND,+BS. No HSM. No rebound. MSK: Nml gait   Laboratory and Imaging Data: Results for orders placed or performed in visit on 04/01/22  POC COVID-19  Result Value Ref  Range   SARS Coronavirus 2 Ag Positive (A) Negative     Assessment and Plan:     ICD-10-CM   1. COVID-19  U07.1     2. Cough, unspecified type  R05.9 POC COVID-19     Confirmed COVID-19, test today.  I reviewed COVID-19 in general with the patient, and we will continue expectant management.  Given her risk profile and age, we are going to start her on some antivirals.  Tessalon as needed for cough.  We reviewed contact precautions.  Medication Management during today's office visit: Meds ordered this encounter  Medications   molnupiravir EUA (LAGEVRIO) 200 mg CAPS capsule    Sig: Take 4 capsules (800 mg total) by mouth 2 (two) times daily for 5 days.    Dispense:  40 capsule    Refill:  0   benzonatate (TESSALON) 200 MG capsule    Sig: Take 1 capsule (200 mg total) by mouth 3 (three) times daily as needed for cough.    Dispense:  50 capsule    Refill:  1   There are no discontinued medications.  Orders placed today for conditions managed today: Orders Placed This Encounter  Procedures   POC COVID-19    Disposition: No follow-ups on file.  Dragon [employer] One speech-to-text software was used for transcription in this dictation.  Possible transcriptional errors can occur using Dragon  software.   Signed,  Maud Deed. Berdie Malter, MD   Outpatient Encounter Medications as of 04/01/2022  Medication Sig   acetaminophen (TYLENOL) 650 MG CR tablet Take 650 mg by mouth every 8 (eight) hours as needed for pain.   albuterol (VENTOLIN HFA) 108 (90 Base) MCG/ACT inhaler Inhale 2 puffs into the lungs every 6 (six) hours as needed for wheezing or shortness of breath.   Apple Cid Vn-Grn Tea-Bit Or-Cr (APPLE CIDER VINEGAR PLUS) TABS Take 2 tablets by mouth 3 (three) times daily before meals.   aspirin-acetaminophen-caffeine (EXCEDRIN MIGRAINE) 250-250-65 MG tablet Take 1 tablet by mouth every 6 (six) hours as needed for headache.   benzonatate (TESSALON) 200 MG capsule Take 1 capsule (200  mg total) by mouth 3 (three) times daily as needed for cough.   Cholecalciferol (VITAMIN D) 2000 units CAPS Take 1 capsule by mouth daily.   diphenhydramine-acetaminophen (TYLENOL PM) 25-500 MG TABS tablet Take 1 tablet by mouth at bedtime as needed.   ezetimibe (ZETIA) 10 MG tablet TAKE 1 TABLET BY MOUTH EVERY DAY   famotidine (PEPCID) 20 MG tablet Take 20 mg by mouth as needed.    Fexofenadine HCl (ALLEGRA PO) Take by mouth daily.   fluticasone furoate-vilanterol (BREO ELLIPTA) 200-25 MCG/ACT AEPB INHALE 1 PUFF BY MOUTH EVERY DAY   furosemide (LASIX) 20 MG tablet TAKE 1 TABLET (20 MG TOTAL) BY MOUTH DAILY AS NEEDED FOR FLUID.   HYDROcodone-acetaminophen (NORCO/VICODIN) 5-325 MG tablet Take 1 tablet by mouth every 6 (six) hours as needed for moderate pain.   ibuprofen (ADVIL) 200 MG tablet Take 3-4 tablets (600-800 mg total) by mouth daily as needed.   Loperamide HCl (ANTI-DIARRHEAL PO) Take by mouth.   Magnesium 250 MG TABS Take 1 tablet by mouth daily.   MELATONIN PO Take 5-10 mg by mouth at bedtime.   MISC NATURAL PRODUCTS PO Take by mouth as needed. Bladder Control   molnupiravir EUA (LAGEVRIO) 200 mg CAPS capsule Take 4 capsules (800 mg total) by mouth 2 (two) times daily for 5 days.   mometasone (ELOCON) 0.1 % cream Apply 1 application topically daily. (Patient taking differently: Apply 1 application  topically as needed.)   montelukast (SINGULAIR) 10 MG tablet TAKE 1 TABLET BY MOUTH EVERY DAY IN THE EVENING   Multiple Vitamin (MULTIVITAMIN) tablet Take 1 tablet by mouth daily.   potassium chloride (K-DUR) 10 MEQ tablet Take 1 tablet (10 mEq total) by mouth daily as needed. With lasix   pyridOXINE (VITAMIN B-6) 50 MG tablet Take 1 tablet (50 mg total) by mouth daily.   telmisartan (MICARDIS) 20 MG tablet TAKE 1 TABLET BY MOUTH EVERY DAY   tiotropium (SPIRIVA HANDIHALER) 18 MCG inhalation capsule INHALE 1 CAPSULE VIA HANDIHALER ONCE DAILY AT THE SAME TIME EVERY DAY   TURMERIC PO Take  1,000 mg by mouth daily.   vitamin B-12 (CYANOCOBALAMIN) 1000 MCG tablet Take 1 tablet (1,000 mcg total) by mouth daily.   No facility-administered encounter medications on file as of 04/01/2022.

## 2022-04-02 ENCOUNTER — Encounter: Payer: Self-pay | Admitting: Family Medicine

## 2022-04-04 ENCOUNTER — Telehealth: Payer: Self-pay | Admitting: Family Medicine

## 2022-04-04 NOTE — Telephone Encounter (Signed)
Pt called asking to speak to Butch Penny. Pt stated Dr. Lorelei Pont could pt could go out with a mask starting 04/02/22. Pt is asking when will she be able to go back around people without a mask? Pt stated she wants to be around her family without having to wear a mask. Pt stated she is almost out of prescribed meds,molnupiravir EUA (LAGEVRIO) 200 mg CAPS capsule. Call back # [phone]

## 2022-04-04 NOTE — Telephone Encounter (Addendum)
Left message for Monica Wise that she needs to quarantine for 5 days (from onset on symptoms), then she can go out but will need to mask for an additional 5 days per Dr. Lorelei Pont.

## 2022-04-04 NOTE — Telephone Encounter (Signed)
She should mask additionally for 5 days.  5 days of quarantine, 5 days masked off of quarantine.

## 2022-04-09 ENCOUNTER — Telehealth: Payer: Self-pay | Admitting: Family Medicine

## 2022-04-09 NOTE — Telephone Encounter (Signed)
Would ideally wait 2 wks after ful recovery from [place] before getting flu shot.  If she's feeling well, likely ok to go ahead and receive flu shot.

## 2022-04-09 NOTE — Telephone Encounter (Signed)
Spoke to patient by telephone and was advised that she is having a little sinus/facial pain but was out in the wind a lot yesterday. Patient stated that she is having some sinus drainage. Patient denies a fever, cough or any other covid symptoms at this time. Patient wants to know how long she should wait after covid to get the flu shot.

## 2022-04-09 NOTE — Telephone Encounter (Signed)
Pt called asking since she recovered from covid & feeling better, it is ok for her to receive the flu shot? Call back # [phone]

## 2022-04-10 DIAGNOSIS — G629 Polyneuropathy, unspecified: Secondary | ICD-10-CM | POA: Diagnosis not present

## 2022-04-10 DIAGNOSIS — R531 Weakness: Secondary | ICD-10-CM | POA: Diagnosis not present

## 2022-04-10 DIAGNOSIS — R200 Anesthesia of skin: Secondary | ICD-10-CM | POA: Diagnosis not present

## 2022-04-10 NOTE — Telephone Encounter (Signed)
Left a message on voicemail to call the office back. ?

## 2022-04-10 NOTE — Telephone Encounter (Signed)
Patient notified as instructed by telephone and verbalized understanding. 

## 2022-06-05 DIAGNOSIS — R200 Anesthesia of skin: Secondary | ICD-10-CM | POA: Diagnosis not present

## 2022-06-05 DIAGNOSIS — G629 Polyneuropathy, unspecified: Secondary | ICD-10-CM | POA: Diagnosis not present

## 2022-06-05 DIAGNOSIS — R209 Unspecified disturbances of skin sensation: Secondary | ICD-10-CM | POA: Diagnosis not present

## 2022-06-14 DIAGNOSIS — Z1231 Encounter for screening mammogram for malignant neoplasm of breast: Secondary | ICD-10-CM | POA: Diagnosis not present

## 2022-06-14 LAB — HM MAMMOGRAPHY

## 2022-06-18 DIAGNOSIS — Z1239 Encounter for other screening for malignant neoplasm of breast: Secondary | ICD-10-CM | POA: Diagnosis not present

## 2022-07-03 ENCOUNTER — Other Ambulatory Visit: Payer: Self-pay | Admitting: Family Medicine

## 2022-07-03 DIAGNOSIS — I10 Essential (primary) hypertension: Secondary | ICD-10-CM

## 2022-07-05 NOTE — Telephone Encounter (Signed)
Patient called in to follow up on this refill request.  

## 2022-07-23 ENCOUNTER — Ambulatory Visit
Admission: RE | Admit: 2022-07-23 | Discharge: 2022-07-23 | Disposition: A | Discharge status: Home / Self Care | Payer: Medicare Other | Source: Ambulatory Visit | Attending: Acute Care | Admitting: Acute Care

## 2022-07-23 DIAGNOSIS — R918 Other nonspecific abnormal finding of lung field: Secondary | ICD-10-CM | POA: Insufficient documentation

## 2022-07-23 DIAGNOSIS — Z122 Encounter for screening for malignant neoplasm of respiratory organs: Secondary | ICD-10-CM | POA: Diagnosis not present

## 2022-07-23 DIAGNOSIS — Z87891 Personal history of nicotine dependence: Secondary | ICD-10-CM | POA: Diagnosis not present

## 2022-07-23 DIAGNOSIS — J432 Centrilobular emphysema: Secondary | ICD-10-CM | POA: Insufficient documentation

## 2022-07-23 DIAGNOSIS — R911 Solitary pulmonary nodule: Secondary | ICD-10-CM | POA: Diagnosis not present

## 2022-07-23 DIAGNOSIS — I700 Atherosclerosis of aorta: Secondary | ICD-10-CM | POA: Insufficient documentation

## 2022-07-23 DIAGNOSIS — I2510 Atherosclerotic heart disease of native coronary artery without angina pectoris: Secondary | ICD-10-CM | POA: Insufficient documentation

## 2022-07-23 DIAGNOSIS — J439 Emphysema, unspecified: Secondary | ICD-10-CM | POA: Insufficient documentation

## 2022-07-24 DIAGNOSIS — H6983 Other specified disorders of Eustachian tube, bilateral: Secondary | ICD-10-CM | POA: Diagnosis not present

## 2022-07-24 DIAGNOSIS — H6122 Impacted cerumen, left ear: Secondary | ICD-10-CM | POA: Diagnosis not present

## 2022-08-08 ENCOUNTER — Ambulatory Visit (INDEPENDENT_AMBULATORY_CARE_PROVIDER_SITE_OTHER): Payer: Medicare Other

## 2022-08-08 VITALS — Wt 224.0 lb

## 2022-08-08 DIAGNOSIS — Z0000 Encounter for general adult medical examination without abnormal findings: Secondary | ICD-10-CM | POA: Diagnosis not present

## 2022-08-08 NOTE — Patient Instructions (Signed)
[name] , Thank you for taking time to come for your Medicare Wellness Visit. I appreciate your ongoing commitment to your health goals. Please review the following plan we discussed and let me know if I can assist you in the future.   These are the goals we discussed:  Goals      DIET - INCREASE WATER INTAKE     Starting 10/09/2017, I will continue to drink 5-6 glasses of water daily.      Patient Stated     03/18/19, Patient states that she would like to be able to walk better.      Patient Stated     03/21/2020, I will maintain and continue medications as prescribed.      Patient Stated     Would like to maintain current routine      Patient Stated     Be as healthy as I can      safety     Starting 10/03/16, I will continue to use cane as needed to reduce risk of falls.         This is a list of the screening recommended for you and due dates:  Health Maintenance  Topic Date Due   Zoster (Shingles) Vaccine (1 of 2) Never done   COVID-19 Vaccine (5 - 2023-24 season) 03/01/2022   Mammogram  06/13/2022   Flu Shot  09/29/2022*   DTaP/Tdap/Td vaccine (1 - Tdap) 03/17/2029*   Screening for Lung Cancer  07/24/2023   Medicare Annual Wellness Visit  08/09/2023   Pneumonia Vaccine  Completed   DEXA scan (bone density measurement)  Completed   Hepatitis C Screening: USPSTF Recommendation to screen - Ages 43-79 yo.  Completed   HPV Vaccine  Aged Out   Colon Cancer Screening  Discontinued  *Topic was postponed. The date shown is not the original due date.    Advanced directives: Please bring a copy of your health care power of attorney and living will to the office at your convenience.  Conditions/risks identified: be as healthy as I can   Next appointment: Follow up in one year for your annual wellness visit    Preventive Care 65 Years and Older, Female Preventive care refers to lifestyle choices and visits with your health care provider that can promote health and  wellness. What does preventive care include? A yearly physical exam. This is also called an annual well check. Dental exams once or twice a year. Routine eye exams. Ask your health care provider how often you should have your eyes checked. Personal lifestyle choices, including: Daily care of your teeth and gums. Regular physical activity. Eating a healthy diet. Avoiding tobacco and drug use. Limiting alcohol use. Practicing safe sex. Taking low-dose aspirin every day. Taking vitamin and mineral supplements as recommended by your health care provider. What happens during an annual well check? The services and screenings done by your health care provider during your annual well check will depend on your age, overall health, lifestyle risk factors, and family history of disease. Counseling  Your health care provider may ask you questions about your: Alcohol use. Tobacco use. Drug use. Emotional well-being. Home and relationship well-being. Sexual activity. Eating habits. History of falls. Memory and ability to understand (cognition). Work and work Statistician. Reproductive health. Screening  You may have the following tests or measurements: Height, weight, and BMI. Blood pressure. Lipid and cholesterol levels. These may be checked every 5 years, or more frequently if you are over 50  years old. Skin check. Lung cancer screening. You may have this screening every year starting at age 67 if you have a 30-pack-year history of smoking and currently smoke or have quit within the past 15 years. Fecal occult blood test (FOBT) of the stool. You may have this test every year starting at age 43. Flexible sigmoidoscopy or colonoscopy. You may have a sigmoidoscopy every 5 years or a colonoscopy every 10 years starting at age 71. Hepatitis C blood test. Hepatitis B blood test. Sexually transmitted disease (STD) testing. Diabetes screening. This is done by checking your blood sugar (glucose)  after you have not eaten for a while (fasting). You may have this done every 1-3 years. Bone density scan. This is done to screen for osteoporosis. You may have this done starting at age 48. Mammogram. This may be done every 1-2 years. Talk to your health care provider about how often you should have regular mammograms. Talk with your health care provider about your test results, treatment options, and if necessary, the need for more tests. Vaccines  Your health care provider may recommend certain vaccines, such as: Influenza vaccine. This is recommended every year. Tetanus, diphtheria, and acellular pertussis (Tdap, Td) vaccine. You may need a Td booster every 10 years. Zoster vaccine. You may need this after age 67. Pneumococcal 13-valent conjugate (PCV13) vaccine. One dose is recommended after age 19. Pneumococcal polysaccharide (PPSV23) vaccine. One dose is recommended after age 28. Talk to your health care provider about which screenings and vaccines you need and how often you need them. This information is not intended to replace advice given to you by your health care provider. Make sure you discuss any questions you have with your health care provider. Document Released: 07/14/2015 Document Revised: 03/06/2016 Document Reviewed: 04/18/2015 Elsevier Interactive Patient Education  2017 [place] Prevention in the Home Falls can cause injuries. They can happen to people of all ages. There are many things you can do to make your home safe and to help prevent falls. What can I do on the outside of my home? Regularly fix the edges of walkways and driveways and fix any cracks. Remove anything that might make you trip as you walk through a door, such as a raised step or threshold. Trim any bushes or trees on the path to your home. Use bright outdoor lighting. Clear any walking paths of anything that might make someone trip, such as rocks or tools. Regularly check to see if  handrails are loose or broken. Make sure that both sides of any steps have handrails. Any raised decks and porches should have guardrails on the edges. Have any leaves, snow, or ice cleared regularly. Use sand or salt on walking paths during winter. Clean up any spills in your garage right away. This includes oil or grease spills. What can I do in the bathroom? Use night lights. Install grab bars by the toilet and in the tub and shower. Do not use towel bars as grab bars. Use non-skid mats or decals in the tub or shower. If you need to sit down in the shower, use a plastic, non-slip stool. Keep the floor dry. Clean up any water that spills on the floor as soon as it happens. Remove soap buildup in the tub or shower regularly. Attach bath mats securely with double-sided non-slip rug tape. Do not have throw rugs and other things on the floor that can make you trip. What can I do in the bedroom? Use  night lights. Make sure that you have a light by your bed that is easy to reach. Do not use any sheets or blankets that are too big for your bed. They should not hang down onto the floor. Have a firm chair that has side arms. You can use this for support while you get dressed. Do not have throw rugs and other things on the floor that can make you trip. What can I do in the kitchen? Clean up any spills right away. Avoid walking on wet floors. Keep items that you use a lot in easy-to-reach places. If you need to reach something above you, use a strong step stool that has a grab bar. Keep electrical cords out of the way. Do not use floor polish or wax that makes floors slippery. If you must use wax, use non-skid floor wax. Do not have throw rugs and other things on the floor that can make you trip. What can I do with my stairs? Do not leave any items on the stairs. Make sure that there are handrails on both sides of the stairs and use them. Fix handrails that are broken or loose. Make sure that  handrails are as long as the stairways. Check any carpeting to make sure that it is firmly attached to the stairs. Fix any carpet that is loose or worn. Avoid having throw rugs at the top or bottom of the stairs. If you do have throw rugs, attach them to the floor with carpet tape. Make sure that you have a light switch at the top of the stairs and the bottom of the stairs. If you do not have them, ask someone to add them for you. What else can I do to help prevent falls? Wear shoes that: Do not have high heels. Have rubber bottoms. Are comfortable and fit you well. Are closed at the toe. Do not wear sandals. If you use a stepladder: Make sure that it is fully opened. Do not climb a closed stepladder. Make sure that both sides of the stepladder are locked into place. Ask someone to hold it for you, if possible. Clearly mark and make sure that you can see: Any grab bars or handrails. First and last steps. Where the edge of each step is. Use tools that help you move around (mobility aids) if they are needed. These include: Canes. Walkers. Scooters. Crutches. Turn on the lights when you go into a dark area. Replace any light bulbs as soon as they burn out. Set up your furniture so you have a clear path. Avoid moving your furniture around. If any of your floors are uneven, fix them. If there are any pets around you, be aware of where they are. Review your medicines with your doctor. Some medicines can make you feel dizzy. This can increase your chance of falling. Ask your doctor what other things that you can do to help prevent falls. This information is not intended to replace advice given to you by your health care provider. Make sure you discuss any questions you have with your health care provider. Document Released: 04/13/2009 Document Revised: 11/23/2015 Document Reviewed: 07/22/2014 Elsevier Interactive Patient Education  2017 [employer].

## 2022-08-08 NOTE — Progress Notes (Addendum)
I connected with  Monica Wise on 08/08/22 by a audio enabled telemedicine application and verified that I am speaking with the correct person using two identifiers.  Patient Location: Home  Provider Location: Office/Clinic  I discussed the limitations of evaluation and management by telemedicine. The patient expressed understanding and agreed to proceed.   Subjective:   Monica Wise is a 77 y.o. female who presents for Medicare Annual (Subsequent) preventive examination.  Review of Systems     Cardiac Risk Factors include: advanced age (>36mn, >>58women);hypertension;obesity (BMI >30kg/m2)     Objective:    Today's Vitals   08/08/22 1358  Weight: 224 lb (101.6 kg)   Body mass index is 40.32 kg/m.     08/08/2022    2:07 PM 08/07/2021   12:06 PM 04/26/2020    8:01 AM 03/21/2020    3:33 PM 03/18/2019    1:09 PM 10/09/2017   11:28 AM 10/03/2016    2:23 PM  Advanced Directives  Does Patient Have a Medical Advance Directive? Yes Yes Yes No No Yes Yes  Type of [place] will Healthcare Power of [place] Does patient want to make changes to medical advance directive?  Yes (MAU/Ambulatory/Procedural Areas - Information given)       Copy of [place]? No - copy requested  No - copy requested   No - copy requested No - copy requested  Would patient like information on creating a medical advance directive?    No - Patient declined No - Patient declined      Current Medications (verified) Outpatient Encounter Medications as of 08/08/2022  Medication Sig   acetaminophen (TYLENOL) 650 MG CR tablet Take 650 mg by mouth every 8 (eight) hours as needed for pain.   albuterol (VENTOLIN HFA) 108 (90 Base) MCG/ACT inhaler Inhale 2 puffs into the lungs every 6 (six) hours as needed for wheezing or shortness of breath.   Apple Cid Vn-Grn  Tea-Bit Or-Cr (APPLE CIDER VINEGAR PLUS) TABS Take 2 tablets by mouth 3 (three) times daily before meals.   aspirin-acetaminophen-caffeine (EXCEDRIN MIGRAINE) 250-250-65 MG tablet Take 1 tablet by mouth every 6 (six) hours as needed for headache.   Cholecalciferol (VITAMIN D) 2000 units CAPS Take 1 capsule by mouth daily.   diphenhydramine-acetaminophen (TYLENOL PM) 25-500 MG TABS tablet Take 1 tablet by mouth at bedtime as needed.   ezetimibe (ZETIA) 10 MG tablet TAKE 1 TABLET BY MOUTH EVERY DAY   famotidine (PEPCID) 20 MG tablet Take 20 mg by mouth as needed.    Fexofenadine HCl (ALLEGRA PO) Take by mouth daily.   fluticasone furoate-vilanterol (BREO ELLIPTA) 200-25 MCG/ACT AEPB INHALE 1 PUFF BY MOUTH EVERY DAY   furosemide (LASIX) 20 MG tablet TAKE 1 TABLET (20 MG TOTAL) BY MOUTH DAILY AS NEEDED FOR FLUID.   gabapentin (NEURONTIN) 400 MG capsule Take 400 mg by mouth 2 (two) times daily.   HYDROcodone-acetaminophen (NORCO/VICODIN) 5-325 MG tablet Take 1 tablet by mouth every 6 (six) hours as needed for moderate pain.   ibuprofen (ADVIL) 200 MG tablet Take 3-4 tablets (600-800 mg total) by mouth daily as needed.   Loperamide HCl (ANTI-DIARRHEAL PO) Take by mouth.   Magnesium 250 MG TABS Take 1 tablet by mouth daily.   MELATONIN PO Take 5-10 mg by mouth at bedtime.   montelukast (SINGULAIR) 10 MG tablet TAKE 1  TABLET BY MOUTH EVERY DAY IN THE EVENING   Multiple Vitamin (MULTIVITAMIN) tablet Take 1 tablet by mouth daily.   potassium chloride (K-DUR) 10 MEQ tablet Take 1 tablet (10 mEq total) by mouth daily as needed. With lasix   telmisartan (MICARDIS) 20 MG tablet TAKE 1 TABLET BY MOUTH EVERY DAY   tiotropium (SPIRIVA HANDIHALER) 18 MCG inhalation capsule INHALE 1 CAPSULE VIA HANDIHALER ONCE DAILY AT THE SAME TIME EVERY DAY   TURMERIC PO Take 1,000 mg by mouth daily.   vitamin B-12 (CYANOCOBALAMIN) 1000 MCG tablet Take 1 tablet (1,000 mcg total) by mouth daily.   benzonatate (TESSALON) 200 MG  capsule Take 1 capsule (200 mg total) by mouth 3 (three) times daily as needed for cough. (Patient not taking: Reported on 08/08/2022)   MISC NATURAL PRODUCTS PO Take by mouth as needed. Bladder Control (Patient not taking: Reported on 08/08/2022)   [DISCONTINUED] mometasone (ELOCON) 0.1 % cream Apply 1 application topically daily.   [DISCONTINUED] pyridOXINE (VITAMIN B-6) 50 MG tablet Take 1 tablet (50 mg total) by mouth daily.   No facility-administered encounter medications on file as of 08/08/2022.    Allergies (verified) Altace [ramipril], Benicar [olmesartan], Codeine, Demerol [meperidine], Statins, and Sulfa antibiotics   History: Past Medical History:  Diagnosis Date   Adrenal adenoma, right 10/18/2016   Incidental by CT 09/2016   Aortic valve sclerosis 2014   withoout stenosis, mild aortic insufficiency - Dr Ubaldo Glassing   Asthma    CAD (coronary artery disease) 10/18/2016   By CT 09/2016   Centrilobular emphysema ([place]) 10/18/2016   By CT 09/2016   Chronic diarrhea 2011   worse in am, ?IBS   COPD (chronic obstructive pulmonary disease) ([place]) 2005   chronic bronchitis - spirometry FVC 70%, FEV1 72%, abnormal loop volume (unknown date)   Essential hypertension    Ex-smoker    GERD (gastroesophageal reflux disease)    Heart murmur    History of chicken pox    History of measles    Hyperlipidemia    IBS (irritable bowel syndrome)    Lumbago    Lump or mass in breast 2012   right breast US guided finesse 1 o'clock   Obesity    Osteoarthritis    knees s/p surgeries   Osteopenia 09/2014   mild by DEXA (-1.2 femur neck)   Perennial allergic rhinitis    Weakness of left upper extremity    Past Surgical History:  Procedure Laterality Date   BREAST BIOPSY Right 2012   BREAST LUMPECTOMY Left 1995   benign mass   CARDIOVASCULAR STRESS TEST  12/2004   nl myoview, preserved LV fxn with EF 71% without reversible ischemia ([place])   COLONOSCOPY  2010   hyperplastic polyps identified. rpt 10 yrs  ([place])   COLONOSCOPY  03/2020   rectal petechiae ([place])   COLONOSCOPY WITH PROPOFOL N/A 04/26/2020   Procedure: COLONOSCOPY WITH PROPOFOL;  Surgeon: Robert Bellow, MD;  Location: [facility] ENDOSCOPY;  Service: Endoscopy;  Laterality: N/A;   DEXA  09/2014   T -1.2 femur neck   ESOPHAGOGASTRODUODENOSCOPY  2010   [place]   KNEE SURGERY Right 2010   arthroscopic   KNEE SURGERY Left 2014   Family History  Problem Relation Age of Onset   Diverticulitis Mother    Heart failure Mother    Cancer Maternal Aunt        bone, and unsure   CAD Father 46       MI x3  Sudden death Brother 33   Social History   Socioeconomic History   Marital status: Divorced    Spouse name: Not on file   Number of children: 0   Years of education: some college   Highest education level: Not on file  Occupational History   Occupation: Retired  Tobacco Use   Smoking status: Former    Packs/day: 1.00    Years: 30.00    Total pack years: 30.00    Types: Cigarettes    Quit date: 05/31/2008    Years since quitting: 14.1   Smokeless tobacco: Never  Vaping Use   Vaping Use: Never used  Substance and Sexual Activity   Alcohol use: Yes    Comment: occasionally wine   Drug use: No   Sexual activity: Not Currently  Other Topics Concern   Not on file  Social History Narrative   Lives alone, 2 feral cats   Occupation: retired, worked for [employer]   Edu: TEFL teacher   Activity: works in yard, unable to walk 2/2 knee pain   Diet: good water, fruits/vegetables daily   Right-handed.   No daily use of caffeine. Occasional tea.    Social Determinants of Health   Financial Resource Strain: Low Risk  (08/08/2022)   Overall Financial Resource Strain (CARDIA)    Difficulty of Paying Living Expenses: Not hard at all  Food Insecurity: No Food Insecurity (08/08/2022)   Hunger Vital Sign    Worried About Running Out of Food in the Last Year: Never true    Ran Out of Food in the Last Year: Never true   Transportation Needs: No Transportation Needs (08/08/2022)   PRAPARE - Hydrologist (Medical): No    Lack of Transportation (Non-Medical): No  Physical Activity: Insufficiently Active (08/08/2022)   Exercise Vital Sign    Days of Exercise per Week: 7 days    Minutes of Exercise per Session: 10 min  Stress: No Stress Concern Present (08/08/2022)   [place]    Feeling of Stress : Not at all  Social Connections: Socially Isolated (08/08/2022)   Social Connection and Isolation Panel [NHANES]    Frequency of Communication with Friends and Family: More than three times a week    Frequency of Social Gatherings with Friends and Family: More than three times a week    Attends Religious Services: Never    Marine scientist or Organizations: No    Attends Archivist Meetings: Never    Marital Status: Widowed    Tobacco Counseling Counseling given: Not Answered   Clinical Intake:  Pre-visit preparation completed: Yes  Pain : No/denies pain     BMI - recorded: 40.32 Nutritional Status: BMI > 30  Obese Nutritional Risks: None Diabetes: No  How often do you need to have someone help you when you read instructions, pamphlets, or other written materials from your doctor or pharmacy?: 1 - Never  Diabetic?no  Interpreter Needed?: No  Information entered by :: Charlott Rakes, LPN   Activities of Daily Living    08/08/2022    2:09 PM  In your present state of health, do you have any difficulty performing the following activities:  Hearing? 0  Vision? 0  Difficulty concentrating or making decisions? 0  Walking or climbing stairs? 0  Dressing or bathing? 0  Doing errands, shopping? 0  Preparing Food and eating ? N  Using the Toilet? N  In the past six months, have you accidently leaked urine? N  Do you have problems with loss of bowel control? N  Managing your Medications? N   Managing your Finances? N  Housekeeping or managing your Housekeeping? N    Patient Care Team: Ria Bush, MD as PCP - General (Family Medicine) Bary Castilla, Forest Gleason, MD (General Surgery) Lorelee Market, MD (Family Medicine) Owens Loffler, MD as Consulting Physician (Orthopedic Surgery)  Indicate any recent Medical Services you may have received from other than [facility] providers in the past year (date may be approximate).     Assessment:   This is a routine wellness examination for Monica Wise.  Hearing/Vision screen Hearing Screening - Comments:: Pt denies any hearing issues  Vision Screening - Comments:: Encouraged to follow up with provider   Dietary issues and exercise activities discussed: Current Exercise Habits: Home exercise routine, Type of exercise: walking, Time (Minutes): 10, Frequency (Times/Week): 7, Weekly Exercise (Minutes/Week): 70   Goals Addressed             This Visit's Progress    Patient Stated       Be as healthy as I can        Depression Screen    08/08/2022    2:05 PM 08/07/2021   12:09 PM 03/21/2020    3:35 PM 03/18/2019    1:11 PM 10/09/2017   11:24 AM 10/03/2016    2:19 PM 09/26/2015    3:13 PM  PHQ 2/9 Scores  PHQ - 2 Score 0 0 0 0 2 1 0  PHQ- 9 Score   0 0 7      Fall Risk    08/08/2022    2:09 PM 08/07/2021   12:08 PM 03/21/2020    3:35 PM 03/18/2019    1:11 PM 06/16/2018    3:01 PM  Fall Risk   Falls in the past year? 0 0 0 0 0  Number falls in past yr: 0 0 0 0   Injury with Fall? 0 0 0    Risk for fall due to : Impaired vision  Medication side effect Medication side effect;Impaired balance/gait   Follow up Falls prevention discussed Falls prevention discussed Falls evaluation completed;Falls prevention discussed Falls evaluation completed;Falls prevention discussed     FALL RISK PREVENTION PERTAINING TO THE HOME:  Any stairs in or around the home? Yes  If so, are there any without handrails? No  Home free of loose throw rugs  in walkways, pet beds, electrical cords, etc? Yes  Adequate lighting in your home to reduce risk of falls? Yes   ASSISTIVE DEVICES UTILIZED TO PREVENT FALLS:  Life alert? No  Use of a cane, walker or w/c? Yes  Grab bars in the bathroom? No  Shower chair or bench in shower? No  Elevated toilet seat or a handicapped toilet? No   TIMED UP AND GO:  Was the test performed? No .   Cognitive Function:    03/21/2020    3:37 PM 03/18/2019    1:17 PM 10/09/2017   11:26 AM 10/03/2016    2:21 PM  MMSE - Mini Mental State Exam  Orientation to time '5 5 5 5  '$ Orientation to Place '5 5 5 5  '$ Registration '3 3 3 3  '$ Attention/ Calculation 5 5 0 0  Recall '3 3 3 3  '$ Language- name 2 objects   0 0  Language- repeat '1 1 1 1  '$ Language- follow 3 step command   3 3  Language- read & follow direction   0 0  Write a sentence   0 0  Copy design   0 0  Total score   20 20        08/08/2022    2:11 PM  6CIT Screen  What Year? 0 points  What month? 0 points  What time? 0 points  Count back from 20 0 points  Months in reverse 0 points  Repeat phrase 0 points  Total Score 0 points    Immunizations Immunization History  Administered Date(s) Administered   Fluad Quad(high Dose 65+) 04/06/2019, 03/22/2020   Influenza, High Dose Seasonal PF 06/12/2017, 04/18/2018, 05/08/2021   Influenza-Unspecified 03/31/2014, 03/02/2015, 05/22/2016   PFIZER(Purple Top)SARS-COV-2 Vaccination 07/28/2019, 08/18/2019, 11/25/2019, 04/20/2020   Pneumococcal Conjugate-13 10/03/2015   Pneumococcal Polysaccharide-23 10/03/2016   Pneumococcal-Unspecified 07/02/1999   Zoster, Live 07/04/2014    TDAP status: Due, Education has been provided regarding the importance of this vaccine. Advised may receive this vaccine at local pharmacy or Health Dept. Aware to provide a copy of the vaccination record if obtained from local pharmacy or Health Dept. Verbalized acceptance and understanding.  Flu Vaccine status: Due, Education has been  provided regarding the importance of this vaccine. Advised may receive this vaccine at local pharmacy or Health Dept. Aware to provide a copy of the vaccination record if obtained from local pharmacy or Health Dept. Verbalized acceptance and understanding.  Pneumococcal vaccine status: Up to date  Covid-19 vaccine status: Completed vaccines  Qualifies for Shingles Vaccine? Yes   Zostavax completed No   Shingrix Completed?: No.    Education has been provided regarding the importance of this vaccine. Patient has been advised to call insurance company to determine out of pocket expense if they have not yet received this vaccine. Advised may also receive vaccine at local pharmacy or Health Dept. Verbalized acceptance and understanding.  Screening Tests Health Maintenance  Topic Date Due   Zoster Vaccines- Shingrix (1 of 2) Never done   COVID-19 Vaccine (5 - 2023-24 season) 03/01/2022   MAMMOGRAM  06/13/2022   INFLUENZA VACCINE  09/29/2022 (Originally 01/29/2022)   DTaP/Tdap/Td (1 - Tdap) 03/17/2029 (Originally 03/10/1965)   Lung Cancer Screening  07/24/2023   Medicare Annual Wellness (AWV)  08/09/2023   Pneumonia Vaccine 62+ Years old  Completed   DEXA SCAN  Completed   Hepatitis C Screening  Completed   HPV VACCINES  Aged Out   COLONOSCOPY (Pts 45-57yr Insurance coverage will need to be confirmed)  Discontinued    Health Maintenance  Health Maintenance Due  Topic Date Due   Zoster Vaccines- Shingrix (1 of 2) Never done   COVID-19 Vaccine (5 - 2023-24 season) 03/01/2022   MAMMOGRAM  06/13/2022    Colorectal cancer screening: No longer required.   Mammogram status: Completed 06/13/21. Repeat every year  Bone Density status: Completed 10/18/14. Results reflect: Bone density results: OSTEOPENIA. Repeat every 2 years.   Additional Screening:  Hepatitis C Screening:  Completed 09/18/15  Vision Screening: Recommended annual ophthalmology exams for early detection of glaucoma and other  disorders of the eye. Is the patient up to date with their annual eye exam?  No  Who is the provider or what is the name of the office in which the patient attends annual eye exams? Will follow up with provider  If pt is not established with a provider, would they like to be referred to a provider to establish care? no.   Dental Screening: Recommended annual dental exams  for proper oral hygiene  Community Resource Referral / Chronic Care Management: CRR required this visit?  No   CCM required this visit?  No      Plan:     I have personally reviewed and noted the following in the patient's chart:   Medical and social history Use of alcohol, tobacco or illicit drugs  Current medications and supplements including opioid prescriptions. Patient is currently taking opioid prescriptions. Information provided to patient regarding non-opioid alternatives. Patient advised to discuss non-opioid treatment plan with their provider. Functional ability and status Nutritional status Physical activity Advanced directives List of other physicians Hospitalizations, surgeries, and ER visits in previous 12 months Vitals Screenings to include cognitive, depression, and falls Referrals and appointments  In addition, I have reviewed and discussed with patient certain preventive protocols, quality metrics, and best practice recommendations. A written personalized care plan for preventive services as well as general preventive health recommendations were provided to patient.     Willette Brace, LPN   0/08/5007   Nurse Notes: none

## 2022-08-23 ENCOUNTER — Ambulatory Visit (INDEPENDENT_AMBULATORY_CARE_PROVIDER_SITE_OTHER): Payer: Medicare Other | Admitting: Family Medicine

## 2022-08-23 ENCOUNTER — Encounter: Payer: Self-pay | Admitting: Family Medicine

## 2022-08-23 VITALS — BP 146/80 | HR 70 | Temp 97.2°F | Ht 62.5 in | Wt 229.1 lb

## 2022-08-23 DIAGNOSIS — J020 Streptococcal pharyngitis: Secondary | ICD-10-CM

## 2022-08-23 DIAGNOSIS — I358 Other nonrheumatic aortic valve disorders: Secondary | ICD-10-CM

## 2022-08-23 DIAGNOSIS — J029 Acute pharyngitis, unspecified: Secondary | ICD-10-CM

## 2022-08-23 LAB — POCT RAPID STREP A (OFFICE): Rapid Strep A Screen: POSITIVE — AB

## 2022-08-23 MED ORDER — AMOXICILLIN 875 MG PO TABS: 875.0000 mg | ORAL_TABLET | Freq: Two times a day (BID) | ORAL | 0 refills | Status: AC

## 2022-08-23 MED ORDER — FLUCONAZOLE 150 MG PO TABS: 150.0000 mg | ORAL_TABLET | Freq: Once | ORAL | 0 refills | Status: AC

## 2022-08-23 NOTE — Assessment & Plan Note (Signed)
Louder murmur heard today in known aortic sclerosis - will continue to monitor this.

## 2022-08-23 NOTE — Progress Notes (Signed)
Patient ID: JANYIA CARNEVALE, female    DOB: 02/17/1946, 77 y.o.   MRN: [id number]  This visit was conducted in person.  BP (!) 146/80   Pulse 70   Temp (!) 97.2 F (36.2 C) (Temporal)   Ht 5' 2.5" (1.588 m)   Wt 229 lb 2 oz (103.9 kg)   SpO2 95%   BMI 41.24 kg/m   BP Readings from Last 3 Encounters:  08/23/22 (!) 146/80  04/01/22 (!) 140/70  11/09/21 138/70   CC: sinus congestion  Subjective:   HPI: SETAREH LOIACONO is a 77 y.o. female presenting on 08/23/2022 for Sinus Problem (C/o ST, B ear pain, hoarseness and nasal congestion. Sxs started 08/14/22. Denies fever, body aches or HA. Tried OTC decongestant, barely helpful. )   10d h/o ST, ear pain, hoarseness, nasal congestion and swollen glands. Some dyspnea. Increased fatigue.   No fevers/chills, body aches, HA, tooth pain or chest pain. Very little cough   Treating with OTC decongestant as well as tylenol PM with temporary relief  + sick contacts at home - COVID.  No strep at home.      Relevant past medical, surgical, family and social history reviewed and updated as indicated. Interim medical history since our last visit reviewed. Allergies and medications reviewed and updated. Outpatient Medications Prior to Visit  Medication Sig Dispense Refill   acetaminophen (TYLENOL) 650 MG CR tablet Take 650 mg by mouth every 8 (eight) hours as needed for pain.     albuterol (VENTOLIN HFA) 108 (90 Base) MCG/ACT inhaler Inhale 2 puffs into the lungs every 6 (six) hours as needed for wheezing or shortness of breath. 8 g 2   Apple Cid Vn-Grn Tea-Bit Or-Cr (APPLE CIDER VINEGAR PLUS) TABS Take 2 tablets by mouth 3 (three) times daily before meals.     aspirin-acetaminophen-caffeine (EXCEDRIN MIGRAINE) 250-250-65 MG tablet Take 1 tablet by mouth every 6 (six) hours as needed for headache.     Cholecalciferol (VITAMIN D) 2000 units CAPS Take 1 capsule by mouth daily.     diphenhydramine-acetaminophen (TYLENOL PM) 25-500 MG TABS tablet Take 1  tablet by mouth at bedtime as needed.     ezetimibe (ZETIA) 10 MG tablet TAKE 1 TABLET BY MOUTH EVERY DAY 90 tablet 3   famotidine (PEPCID) 20 MG tablet Take 20 mg by mouth as needed.      Fexofenadine HCl (ALLEGRA PO) Take by mouth daily.     fluticasone furoate-vilanterol (BREO ELLIPTA) 200-25 MCG/ACT AEPB INHALE 1 PUFF BY MOUTH EVERY DAY 60 each 11   furosemide (LASIX) 20 MG tablet TAKE 1 TABLET (20 MG TOTAL) BY MOUTH DAILY AS NEEDED FOR FLUID. 30 tablet 0   gabapentin (NEURONTIN) 400 MG capsule Take 400 mg by mouth 2 (two) times daily.     HYDROcodone-acetaminophen (NORCO/VICODIN) 5-325 MG tablet Take 1 tablet by mouth every 6 (six) hours as needed for moderate pain. 20 tablet 0   ibuprofen (ADVIL) 200 MG tablet Take 3-4 tablets (600-800 mg total) by mouth daily as needed.     Loperamide HCl (ANTI-DIARRHEAL PO) Take by mouth.     Magnesium 250 MG TABS Take 1 tablet by mouth daily.     MELATONIN PO Take 5-10 mg by mouth at bedtime.     MISC NATURAL PRODUCTS PO Take by mouth as needed. Bladder Control     MISC NATURAL PRODUCTS PO Take by mouth. Mighty Reds gummies     montelukast (SINGULAIR) 10 MG tablet TAKE  1 TABLET BY MOUTH EVERY DAY IN THE EVENING 90 tablet 3   Multiple Vitamin (MULTIVITAMIN) tablet Take 1 tablet by mouth daily.     potassium chloride (K-DUR) 10 MEQ tablet Take 1 tablet (10 mEq total) by mouth daily as needed. With lasix 30 tablet 2   telmisartan (MICARDIS) 20 MG tablet TAKE 1 TABLET BY MOUTH EVERY DAY 90 tablet 0   tiotropium (SPIRIVA HANDIHALER) 18 MCG inhalation capsule INHALE 1 CAPSULE VIA HANDIHALER ONCE DAILY AT THE SAME TIME EVERY DAY 30 capsule 11   TURMERIC PO Take 1,000 mg by mouth daily.     vitamin B-12 (CYANOCOBALAMIN) 1000 MCG tablet Take 1 tablet (1,000 mcg total) by mouth daily.     benzonatate (TESSALON) 200 MG capsule Take 1 capsule (200 mg total) by mouth 3 (three) times daily as needed for cough. 50 capsule 1   No facility-administered medications  prior to visit.     Per HPI unless specifically indicated in ROS section below Review of Systems  Objective:  BP (!) 146/80   Pulse 70   Temp (!) 97.2 F (36.2 C) (Temporal)   Ht 5' 2.5" (1.588 m)   Wt 229 lb 2 oz (103.9 kg)   SpO2 95%   BMI 41.24 kg/m   Wt Readings from Last 3 Encounters:  08/23/22 229 lb 2 oz (103.9 kg)  08/08/22 224 lb (101.6 kg)  04/01/22 224 lb 6 oz (101.8 kg)      Physical Exam Vitals and nursing note reviewed.  Constitutional:      Appearance: Normal appearance. She is not ill-appearing.  HENT:     Head: Normocephalic and atraumatic.     Right Ear: Tympanic membrane, ear canal and external ear normal. There is no impacted cerumen.     Left Ear: Tympanic membrane, ear canal and external ear normal. There is no impacted cerumen.     Nose: Congestion present. No rhinorrhea.     Right Sinus: Maxillary sinus tenderness and frontal sinus tenderness present.     Left Sinus: Maxillary sinus tenderness and frontal sinus tenderness present.     Mouth/Throat:     Mouth: Mucous membranes are moist.     Pharynx: Oropharynx is clear. Posterior oropharyngeal erythema present. No oropharyngeal exudate.  Eyes:     Extraocular Movements: Extraocular movements intact.     Conjunctiva/sclera: Conjunctivae normal.     Pupils: Pupils are equal, round, and reactive to light.  Cardiovascular:     Rate and Rhythm: Normal rate and regular rhythm.     Pulses: Normal pulses.     Heart sounds: Murmur (4/6 systolic USB) heard.  Pulmonary:     Effort: Pulmonary effort is normal. No respiratory distress.     Breath sounds: Normal breath sounds. No wheezing, rhonchi or rales.  Lymphadenopathy:     Head:     Right side of head: No submental, submandibular, tonsillar, preauricular or posterior auricular adenopathy.     Left side of head: No submental, submandibular, tonsillar, preauricular or posterior auricular adenopathy.     Cervical: No cervical adenopathy.     Right  cervical: No superficial cervical adenopathy.    Left cervical: No superficial cervical adenopathy.     Upper Body:     Right upper body: No supraclavicular adenopathy.     Left upper body: No supraclavicular adenopathy.  Skin:    Findings: No rash.  Neurological:     Mental Status: She is alert.  Psychiatric:  Mood and Affect: Mood normal.        Behavior: Behavior normal.       Results for orders placed or performed in visit on 08/23/22  POCT rapid strep A  Result Value Ref Range   Rapid Strep A Screen Positive (A) Negative    Assessment & Plan:   Problem List Items Addressed This Visit     Aortic valve sclerosis    Louder murmur heard today in known aortic sclerosis - will continue to monitor this.       Strep pharyngitis - Primary    RST positive. Ongoing ST. Rx amoxicillin '875mg'$  BID x10 days. This should help cover possible sinusitis.  May use tylenol/ibuprofen PRN Supportive measures as per instructions. Requests diflucan Rx after amoxicillin course. Did not check COVID as symptoms ongoing for 9-10 days now, wouldn't change management.       Relevant Medications   fluconazole (DIFLUCAN) 150 MG tablet   Other Visit Diagnoses     Sore throat       Relevant Orders   POCT rapid strep A (Completed)        Meds ordered this encounter  Medications   amoxicillin (AMOXIL) 875 MG tablet    Sig: Take 1 tablet (875 mg total) by mouth 2 (two) times daily for 10 days.    Dispense:  20 tablet    Refill:  0   fluconazole (DIFLUCAN) 150 MG tablet    Sig: Take 1 tablet (150 mg total) by mouth once for 1 dose.    Dispense:  1 tablet    Refill:  0    Orders Placed This Encounter  Procedures   POCT rapid strep A    Patient Instructions  Use Coricidin brand over the counter cold remedies - ok for blood pressure.  Strep test returned positive.  Take amoxicillin '875mg'$  twice daily for 10 days.  Push fluids and rest.  Continue tylenol or ibuprofen as needed  for discomfort. Salt water gargles. Suck on cold things like popsicles or warm things like herbal teas (whichever soothes the throat better). Return if fever >101.5, worsening pain, or trouble opening/closing mouth, or hoarse voice.  Follow up plan: Return if symptoms worsen or fail to improve, for follow up visit.  Ria Bush, MD

## 2022-08-23 NOTE — Patient Instructions (Addendum)
Use Coricidin brand over the counter cold remedies - ok for blood pressure.  Strep test returned positive.  Take amoxicillin '875mg'$  twice daily for 10 days.  Push fluids and rest.  Continue tylenol or ibuprofen as needed for discomfort. Salt water gargles. Suck on cold things like popsicles or warm things like herbal teas (whichever soothes the throat better). Return if fever >101.5, worsening pain, or trouble opening/closing mouth, or hoarse voice.

## 2022-08-23 NOTE — Assessment & Plan Note (Addendum)
RST positive. Ongoing ST. Rx amoxicillin '875mg'$  BID x10 days. This should help cover possible sinusitis.  May use tylenol/ibuprofen PRN Supportive measures as per instructions. Requests diflucan Rx after amoxicillin course. Did not check COVID as symptoms ongoing for 9-10 days now, wouldn't change management.

## 2022-09-11 ENCOUNTER — Other Ambulatory Visit (INDEPENDENT_AMBULATORY_CARE_PROVIDER_SITE_OTHER): Payer: Medicare Other

## 2022-09-11 ENCOUNTER — Other Ambulatory Visit: Payer: Self-pay | Admitting: Family Medicine

## 2022-09-11 DIAGNOSIS — M85859 Other specified disorders of bone density and structure, unspecified thigh: Secondary | ICD-10-CM

## 2022-09-11 DIAGNOSIS — E785 Hyperlipidemia, unspecified: Secondary | ICD-10-CM | POA: Diagnosis not present

## 2022-09-12 LAB — COMPREHENSIVE METABOLIC PANEL
ALT: 15 U/L (ref 0–35)
AST: 17 U/L (ref 0–37)
Albumin: 3.9 g/dL (ref 3.5–5.2)
Alkaline Phosphatase: 70 U/L (ref 39–117)
BUN: 16 mg/dL (ref 6–23)
CO2: 28 mEq/L (ref 19–32)
Calcium: 9.8 mg/dL (ref 8.4–10.5)
Chloride: 103 mEq/L (ref 96–112)
Creatinine, Ser: 0.99 mg/dL (ref 0.40–1.20)
GFR: 54.61 mL/min — ABNORMAL LOW (ref >60.00)
Glucose, Bld: 110 mg/dL — ABNORMAL HIGH (ref 70–99)
Potassium: 4.6 mEq/L (ref 3.5–5.1)
Sodium: 140 mEq/L (ref 135–145)
Total Bilirubin: 0.5 mg/dL (ref 0.2–1.2)
Total Protein: 7.3 g/dL (ref 6.0–8.3)

## 2022-09-12 LAB — LIPID PANEL
Cholesterol: 220 mg/dL — ABNORMAL HIGH (ref 0–200)
HDL: 67.8 mg/dL (ref >39.00)
LDL Cholesterol: 131 mg/dL — ABNORMAL HIGH (ref 0–99)
NonHDL: 152.44
Total CHOL/HDL Ratio: 3
Triglycerides: 107 mg/dL (ref 0.0–149.0)
VLDL: 21.4 mg/dL (ref 0.0–40.0)

## 2022-09-12 LAB — VITAMIN D 25 HYDROXY (VIT D DEFICIENCY, FRACTURES): VITD: 21.36 ng/mL — ABNORMAL LOW (ref 30.00–100.00)

## 2022-09-13 ENCOUNTER — Ambulatory Visit (INDEPENDENT_AMBULATORY_CARE_PROVIDER_SITE_OTHER): Payer: Medicare Other | Admitting: Family Medicine

## 2022-09-13 ENCOUNTER — Encounter: Payer: Self-pay | Admitting: Family Medicine

## 2022-09-13 VITALS — BP 138/68 | HR 76 | Temp 97.5°F | Ht 63.25 in | Wt 228.2 lb

## 2022-09-13 DIAGNOSIS — Z87891 Personal history of nicotine dependence: Secondary | ICD-10-CM

## 2022-09-13 DIAGNOSIS — Z0000 Encounter for general adult medical examination without abnormal findings: Secondary | ICD-10-CM

## 2022-09-13 DIAGNOSIS — J3089 Other allergic rhinitis: Secondary | ICD-10-CM

## 2022-09-13 DIAGNOSIS — J432 Centrilobular emphysema: Secondary | ICD-10-CM

## 2022-09-13 DIAGNOSIS — D3501 Benign neoplasm of right adrenal gland: Secondary | ICD-10-CM

## 2022-09-13 DIAGNOSIS — I700 Atherosclerosis of aorta: Secondary | ICD-10-CM

## 2022-09-13 DIAGNOSIS — T466X5A Adverse effect of antihyperlipidemic and antiarteriosclerotic drugs, initial encounter: Secondary | ICD-10-CM

## 2022-09-13 DIAGNOSIS — M85859 Other specified disorders of bone density and structure, unspecified thigh: Secondary | ICD-10-CM

## 2022-09-13 DIAGNOSIS — G720 Drug-induced myopathy: Secondary | ICD-10-CM | POA: Diagnosis not present

## 2022-09-13 DIAGNOSIS — Z7189 Other specified counseling: Secondary | ICD-10-CM

## 2022-09-13 DIAGNOSIS — I358 Other nonrheumatic aortic valve disorders: Secondary | ICD-10-CM | POA: Diagnosis not present

## 2022-09-13 DIAGNOSIS — I10 Essential (primary) hypertension: Secondary | ICD-10-CM | POA: Diagnosis not present

## 2022-09-13 DIAGNOSIS — E7800 Pure hypercholesterolemia, unspecified: Secondary | ICD-10-CM

## 2022-09-13 DIAGNOSIS — M170 Bilateral primary osteoarthritis of knee: Secondary | ICD-10-CM | POA: Diagnosis not present

## 2022-09-13 MED ORDER — TIOTROPIUM BROMIDE MONOHYDRATE 18 MCG IN CAPS: ORAL_CAPSULE | RESPIRATORY_TRACT | 11 refills | Status: DC

## 2022-09-13 NOTE — Assessment & Plan Note (Signed)
Preventative protocols reviewed and updated unless pt declined. Discussed healthy diet and lifestyle.  

## 2022-09-13 NOTE — Assessment & Plan Note (Signed)
Advanced directive: reviewed with patient - son is HCPOA. Has discussed her wishes with her son and a local cousin. Does not want to complete advanced directive but wants to be full code, no prolonged life support if terminal condition.

## 2022-09-13 NOTE — Patient Instructions (Addendum)
We will request latest mammogram from [facility] in [place].  You may be able to get next mammogram at [facility] at [facility] [phone].  If interested, check with pharmacy about new 2 shot shingles series (shingrix).  Touch base with new heart doctor at next appt summer about your heart murmur. Let us or them know sooner if you develop new chest pain, dizziness, palpitations or shortness of breath.  Good to see you today, return as needed or in 1 year for next physical

## 2022-09-13 NOTE — Progress Notes (Signed)
Patient ID: Monica Wise, female    DOB: [birth year], 77 y.o.   MRN: [id number]  This visit was conducted in person.  BP 138/68   Pulse 76   Temp (!) 97.5 F (36.4 C) (Temporal)   Ht 5' 3.25" (1.607 m)   Wt 228 lb 4 oz (103.5 kg)   SpO2 93%   BMI 40.11 kg/m    CC: CPE Subjective:   HPI: Monica Wise is a 77 y.o. female presenting on 09/13/2022 for Annual Exam (MCR prt 2 [AWV- 08/08/22].  Wants to discuss Spiriva. Prefers printed rx. )   Saw health advisor last month for medicare wellness visit. Note reviewed.   No results found.  Flowsheet Row Office Visit from 08/23/2022 in [facility] [place]  PHQ-2 Total Score 3          08/23/2022   12:43 PM 08/08/2022    2:09 PM 08/07/2021   12:08 PM 03/21/2020    3:35 PM 03/18/2019    1:11 PM  Fall Risk   Falls in the past year? 0 0 0 0 0  Number falls in past yr:  0 0 0 0  Injury with Fall?  0 0 0   Risk for fall due to :  Impaired vision  Medication side effect Medication side effect;Impaired balance/gait  Follow up  Falls prevention discussed Falls prevention discussed Falls evaluation completed;Falls prevention discussed Falls evaluation completed;Falls prevention discussed  Had a fall last night - lost balance at home, didn't hit head but landed on hands and knees, sore all over today.   She takes tylenol arthritis during day and tylenol PM at night.   H/o psoriasis - remotely saw [facility] dermatology. No recent outbreaks.  Known aortic valve sclerosis. Latest echo at [facility] clinic 03/2021 reviewed from [facility] - normal LV sys fx with mild LVH, normal RV sys fx, mild AS.   COPD - continues spiriva along with breo ellipta   Preventative: COLONOSCOPY Date: 2010 hyperplastic polyps, rpt 10 yrs ([place]) COLONOSCOPY 03/2020 - rectal petechiae ([place])  Mammogram 05/2022 @ [facility] imaging - this was followed by Dr Lemar Livings until he retired last year. We will request records.  Well woman exam - remotely.  Declines further eval. G1P1.  DEXA Date: 09/2014 T -1.2 femur neck  Lung cancer screening yearly, latest 07/2022 - centrilobular and paraseptal emphysema, aortic ATH, calcified CA  Flu shot - yearly  COVID vaccine - Pfizer 07/2019, 08/2019, booster 03/2020, 10/2020 Pneumovax 2001, prevnar 09/2015, pneumovax 09/2016  Zostavax 07/2014  Shingrix - discussed, declines  Advanced directive: reviewed with patient - son is HCPOA. Has discussed her wishes with her son and a local cousin. Does not want to complete advanced directive but wants to be full code, no prolonged life support if terminal condition.  Seat belt use discussed  Sunscreen use discussed. No changing moles on skin  Ex smoker - quit 2009. 30 PY hx.  Alcohol - few glasses of wine per week  Bowel - no constipation  Bladder - no incontinence    Lives alone, son nearby Occupation: retired, worked for [employer]: [employer] Activity: works in yard, some walking limited by knee pain Diet: good water, fruits/vegetables daily      Relevant past medical, surgical, family and social history reviewed and updated as indicated. Interim medical history since our last visit reviewed. Allergies and medications reviewed and updated. Outpatient Medications Prior to Visit  Medication Sig Dispense Refill  acetaminophen (TYLENOL) 650 MG CR tablet Take 650 mg by mouth every 8 (eight) hours as needed for pain.     albuterol (VENTOLIN HFA) 108 (90 Base) MCG/ACT inhaler Inhale 2 puffs into the lungs every 6 (six) hours as needed for wheezing or shortness of breath. 8 g 2   Apple Cid Vn-Grn Tea-Bit Or-Cr (APPLE CIDER VINEGAR PLUS) TABS Take 2 tablets by mouth 3 (three) times daily before meals.     aspirin-acetaminophen-caffeine (EXCEDRIN MIGRAINE) 250-250-65 MG tablet Take 1 tablet by mouth every 6 (six) hours as needed for headache.     Cholecalciferol (VITAMIN D) 2000 units CAPS Take 1 capsule by mouth daily.     diphenhydramine-acetaminophen (TYLENOL  PM) 25-500 MG TABS tablet Take 1 tablet by mouth at bedtime as needed.     ezetimibe (ZETIA) 10 MG tablet TAKE 1 TABLET BY MOUTH EVERY DAY 90 tablet 3   famotidine (PEPCID) 20 MG tablet Take 20 mg by mouth as needed.      Fexofenadine HCl (ALLEGRA PO) Take by mouth daily.     fluticasone furoate-vilanterol (BREO ELLIPTA) 200-25 MCG/ACT AEPB INHALE 1 PUFF BY MOUTH EVERY DAY 60 each 11   furosemide (LASIX) 20 MG tablet TAKE 1 TABLET (20 MG TOTAL) BY MOUTH DAILY AS NEEDED FOR FLUID. 30 tablet 0   gabapentin (NEURONTIN) 400 MG capsule Take 400 mg by mouth 2 (two) times daily.     HYDROcodone-acetaminophen (NORCO/VICODIN) 5-325 MG tablet Take 1 tablet by mouth every 6 (six) hours as needed for moderate pain. 20 tablet 0   ibuprofen (ADVIL) 200 MG tablet Take 3-4 tablets (600-800 mg total) by mouth daily as needed.     Loperamide HCl (ANTI-DIARRHEAL PO) Take by mouth.     Magnesium 250 MG TABS Take 1 tablet by mouth daily.     MELATONIN PO Take 5-10 mg by mouth at bedtime.     MISC NATURAL PRODUCTS PO Take by mouth as needed. Bladder Control     MISC NATURAL PRODUCTS PO Take by mouth. Mighty Reds gummies     montelukast (SINGULAIR) 10 MG tablet TAKE 1 TABLET BY MOUTH EVERY DAY IN THE EVENING 90 tablet 3   Multiple Vitamin (MULTIVITAMIN) tablet Take 1 tablet by mouth daily.     potassium chloride (K-DUR) 10 MEQ tablet Take 1 tablet (10 mEq total) by mouth daily as needed. With lasix 30 tablet 2   telmisartan (MICARDIS) 20 MG tablet TAKE 1 TABLET BY MOUTH EVERY DAY 90 tablet 0   TURMERIC PO Take 1,000 mg by mouth daily.     vitamin B-12 (CYANOCOBALAMIN) 1000 MCG tablet Take 1 tablet (1,000 mcg total) by mouth daily.     tiotropium (SPIRIVA HANDIHALER) 18 MCG inhalation capsule INHALE 1 CAPSULE VIA HANDIHALER ONCE DAILY AT THE SAME TIME EVERY DAY 30 capsule 11   No facility-administered medications prior to visit.     Per HPI unless specifically indicated in ROS section below Review of Systems   Constitutional:  Negative for activity change, appetite change, chills, fatigue, fever and unexpected weight change.  HENT:  Negative for hearing loss.   Eyes:  Negative for visual disturbance.  Respiratory:  Positive for shortness of breath (occ). Negative for cough, chest tightness and wheezing.   Cardiovascular:  Positive for leg swelling (occ). Negative for chest pain and palpitations.  Gastrointestinal:  Positive for abdominal pain (food related GI upset). Negative for abdominal distention, blood in stool, constipation, diarrhea, nausea and vomiting.  Genitourinary:  Negative for  difficulty urinating and hematuria.  Musculoskeletal:  Negative for arthralgias, myalgias and neck pain.  Skin:  Negative for rash.  Neurological:  Negative for dizziness, seizures, syncope and headaches.  Hematological:  Negative for adenopathy. Bruises/bleeds easily.  Psychiatric/Behavioral:  Negative for dysphoric mood. The patient is not nervous/anxious.     Objective:  BP 138/68   Pulse 76   Temp (!) 97.5 F (36.4 C) (Temporal)   Ht 5' 3.25" (1.607 m)   Wt 228 lb 4 oz (103.5 kg)   SpO2 93%   BMI 40.11 kg/m   Wt Readings from Last 3 Encounters:  09/13/22 228 lb 4 oz (103.5 kg)  08/23/22 229 lb 2 oz (103.9 kg)  08/08/22 224 lb (101.6 kg)      Physical Exam Vitals and nursing note reviewed.  Constitutional:      Appearance: Normal appearance. She is not ill-appearing.     Comments: Ambulates with cane  HENT:     Head: Normocephalic and atraumatic.     Right Ear: Tympanic membrane, ear canal and external ear normal. There is no impacted cerumen.     Left Ear: Tympanic membrane, ear canal and external ear normal. There is no impacted cerumen.     Mouth/Throat:     Mouth: Mucous membranes are moist.     Pharynx: Oropharynx is clear. No oropharyngeal exudate or posterior oropharyngeal erythema.  Eyes:     General:        Right eye: No discharge.        Left eye: No discharge.      Extraocular Movements: Extraocular movements intact.     Conjunctiva/sclera: Conjunctivae normal.     Pupils: Pupils are equal, round, and reactive to light.  Neck:     Thyroid: No thyroid mass or thyromegaly.     Vascular: No carotid bruit.  Cardiovascular:     Rate and Rhythm: Normal rate and regular rhythm.     Pulses: Normal pulses.     Heart sounds: Murmur (3-4/6 systolic throughout) heard.  Pulmonary:     Effort: Pulmonary effort is normal. No respiratory distress.     Breath sounds: Normal breath sounds. No wheezing, rhonchi or rales.  Abdominal:     General: Bowel sounds are normal. There is no distension.     Palpations: Abdomen is soft. There is no mass.     Tenderness: There is no abdominal tenderness. There is no guarding or rebound.     Hernia: No hernia is present.  Musculoskeletal:     Cervical back: Normal range of motion and neck supple. No rigidity.     Right lower leg: No edema.     Left lower leg: No edema.  Lymphadenopathy:     Cervical: No cervical adenopathy.  Skin:    General: Skin is warm and dry.     Findings: No rash.  Neurological:     General: No focal deficit present.     Mental Status: She is alert. Mental status is at baseline.  Psychiatric:        Mood and Affect: Mood normal.        Behavior: Behavior normal.       Results for orders placed or performed in visit on 09/11/22  VITAMIN D 25 Hydroxy (Vit-D Deficiency, Fractures)  Result Value Ref Range   VITD 21.36 (L) 30.00 - 100.00 ng/mL  Comprehensive metabolic panel  Result Value Ref Range   Sodium 140 135 - 145 mEq/L   Potassium 4.6 3.5 -  5.1 mEq/L   Chloride 103 96 - 112 mEq/L   CO2 28 19 - 32 mEq/L   Glucose, Bld 110 (H) 70 - 99 mg/dL   BUN 16 6 - 23 mg/dL   Creatinine, Ser 3.24 0.40 - 1.20 mg/dL   Total Bilirubin 0.5 0.2 - 1.2 mg/dL   Alkaline Phosphatase 70 39 - 117 U/L   AST 17 0 - 37 U/L   ALT 15 0 - 35 U/L   Total Protein 7.3 6.0 - 8.3 g/dL   Albumin 3.9 3.5 - 5.2 g/dL    GFR 40.10 (L) >27.25 mL/min   Calcium 9.8 8.4 - 10.5 mg/dL  Lipid panel  Result Value Ref Range   Cholesterol 220 (H) 0 - 200 mg/dL   Triglycerides 366.4 0.0 - 149.0 mg/dL   HDL 40.34 >74.25 mg/dL   VLDL 95.6 0.0 - 38.7 mg/dL   LDL Cholesterol 564 (H) 0 - 99 mg/dL   Total CHOL/HDL Ratio 3    NonHDL 152.44     Assessment & Plan:   Problem List Items Addressed This Visit     Advanced care planning/counseling discussion (Chronic)    Advanced directive: reviewed with patient - son is HCPOA. Has discussed her wishes with her son and a local cousin. Does not want to complete advanced directive but wants to be full code, no prolonged life support if terminal condition.       Health maintenance examination - Primary (Chronic)    Preventative protocols reviewed and updated unless pt declined. Discussed healthy diet and lifestyle.       Osteoarthritis   Perennial allergic rhinitis    Continue singulair with allegra       Ex-smoker    Quit smoking 05/2008 Continues yearly lung cancer screening CT - this is last year.       Centrilobular emphysema (HCC)    Chronic, stable period on breo + spiriva. Also takes singulair daily.       Relevant Medications   tiotropium (SPIRIVA HANDIHALER) 18 MCG inhalation capsule   Obesity, morbid, BMI 40.0-49.9 (HCC)    Reviewed healthy diet and lifestyle changes to affect sustainable weight loss.        Hyperlipidemia    Chronic, only on ezetimibe. Reviewed elevated cholesterol levels - encouraged renewed efforts towards low chol diet. Crestor intolerance, declined further statins. The 10-year ASCVD risk score (Arnett DK, et al., 2019) is: 32.8%   Values used to calculate the score:     Age: 4 years     Sex: Female     Is Non-Hispanic African American: No     Diabetic: No     Tobacco smoker: No     Systolic Blood Pressure: 138 mmHg     Is BP treated: Yes     HDL Cholesterol: 67.8 mg/dL     Total Cholesterol: 220 mg/dL       Essential  hypertension    Chronic, stable period on current regimen of telmisartan - continue.       Adrenal adenoma, right    Incidental 2018 stable 2019 (max size 1.9cm), not commented on further lung CT imaging.       Aortic valve sclerosis    Known aortic sclerosis with mild aortic stenosis on latest echo 03/2021 - possibly harsher sounding murmur noted.  She does have cardiology f/u scheduled this summer - advised to touch base with them.       Statin myopathy    Declines further statins.  Osteopenia    Encouraged increase vitamin D to 2000-4000 IU daily.  Consider updated DEXA scan.       Aortic atherosclerosis (HCC)    Chronic, only on ezetimibe.         Meds ordered this encounter  Medications   tiotropium (SPIRIVA HANDIHALER) 18 MCG inhalation capsule    Sig: INHALE 1 CAPSULE VIA HANDIHALER ONCE DAILY AT THE SAME TIME EVERY DAY    Dispense:  30 capsule    Refill:  11    No orders of the defined types were placed in this encounter.   Patient Instructions  We will request latest mammogram from [facility] : [facility] in [place].  You may be able to get next mammogram at [facility] at [facility] [phone].  If interested, check with pharmacy about new 2 shot shingles series (shingrix).  Touch base with new heart doctor at next appt summer about your heart murmur. Let us or them know sooner if you develop new chest pain, dizziness, palpitations or shortness of breath.  Good to see you today, return as needed or in 1 year for next physical  Follow up plan: Return in about 1 year (around 09/13/2023), or if symptoms worsen or fail to improve, for annual exam, prior fasting for blood work.  Eustaquio Boyden, MD

## 2022-09-14 NOTE — Assessment & Plan Note (Addendum)
Chronic, only on ezetimibe.

## 2022-09-14 NOTE — Assessment & Plan Note (Signed)
Continue singulair with allegra

## 2022-09-14 NOTE — Assessment & Plan Note (Addendum)
Incidental 2018 stable 2019 (max size 1.9cm), not commented on further lung CT imaging.

## 2022-09-14 NOTE — Assessment & Plan Note (Signed)
Reviewed healthy diet and lifestyle changes to affect sustainable weight loss.  

## 2022-09-14 NOTE — Assessment & Plan Note (Addendum)
Known aortic sclerosis with mild aortic stenosis on latest echo 03/2021 - possibly harsher sounding murmur noted.  She does have cardiology f/u scheduled this summer - advised to touch base with them.

## 2022-09-14 NOTE — Assessment & Plan Note (Deleted)
Continue astelin

## 2022-09-14 NOTE — Assessment & Plan Note (Signed)
Encouraged increase vitamin D to 2000-4000 IU daily.  Consider updated DEXA scan.

## 2022-09-14 NOTE — Assessment & Plan Note (Signed)
Declines further statins.

## 2022-09-14 NOTE — Assessment & Plan Note (Signed)
Quit smoking 05/2008 Continues yearly lung cancer screening CT - this is last year.

## 2022-09-14 NOTE — Assessment & Plan Note (Addendum)
Chronic, stable period on current regimen of telmisartan - continue.

## 2022-09-14 NOTE — Assessment & Plan Note (Signed)
Chronic, only on ezetimibe. Reviewed elevated cholesterol levels - encouraged renewed efforts towards low chol diet. Crestor intolerance, declined further statins. The 10-year ASCVD risk score (Arnett DK, et al., 2019) is: 32.8%   Values used to calculate the score:     Age: 77 years     Sex: Female     Is Non-Hispanic African American: No     Diabetic: No     Tobacco smoker: No     Systolic Blood Pressure: 0000000 mmHg     Is BP treated: Yes     HDL Cholesterol: 67.8 mg/dL     Total Cholesterol: 220 mg/dL

## 2022-09-14 NOTE — Assessment & Plan Note (Signed)
Chronic, stable period on breo + spiriva. Also takes singulair daily.

## 2022-09-16 ENCOUNTER — Other Ambulatory Visit: Payer: Self-pay | Admitting: Family Medicine

## 2022-09-16 DIAGNOSIS — J432 Centrilobular emphysema: Secondary | ICD-10-CM

## 2022-09-16 DIAGNOSIS — J3089 Other allergic rhinitis: Secondary | ICD-10-CM

## 2022-12-02 ENCOUNTER — Other Ambulatory Visit: Payer: Self-pay | Admitting: Family Medicine

## 2022-12-02 DIAGNOSIS — I10 Essential (primary) hypertension: Secondary | ICD-10-CM

## 2022-12-03 NOTE — Progress Notes (Unsigned)
    Monica Romanoski T. Monica Baccam, MD, CAQ Sports Medicine [facility] at [facility] [address] [place], [address]  Phone: [phone]  FAX: [phone]  Monica Wise - 77 y.o. female  MRN [id number]  Date of Birth: 1946/06/12  Date: 12/04/2022  PCP: Eustaquio Boyden, MD  Referral: Eustaquio Boyden, MD  No chief complaint on file.  Subjective:   Monica Wise is a 77 y.o. very pleasant female patient with There is no height or weight on file to calculate BMI. who presents with the following:  Patient presents with an acute sore throat.    Review of Systems is noted in the HPI, as appropriate  Objective:   There were no vitals taken for this visit.  GEN: No acute distress; alert,appropriate. PULM: Breathing comfortably in no respiratory distress PSYCH: Normally interactive.   Laboratory and Imaging Data:  Assessment and Plan:   ***

## 2022-12-04 ENCOUNTER — Ambulatory Visit (INDEPENDENT_AMBULATORY_CARE_PROVIDER_SITE_OTHER): Payer: Medicare Other | Admitting: Family Medicine

## 2022-12-04 ENCOUNTER — Encounter: Payer: Self-pay | Admitting: Family Medicine

## 2022-12-04 VITALS — BP 134/62 | HR 70 | Temp 97.4°F | Ht 63.25 in | Wt 233.1 lb

## 2022-12-04 DIAGNOSIS — J432 Centrilobular emphysema: Secondary | ICD-10-CM

## 2022-12-04 DIAGNOSIS — J020 Streptococcal pharyngitis: Secondary | ICD-10-CM | POA: Diagnosis not present

## 2022-12-04 DIAGNOSIS — U071 COVID-19: Secondary | ICD-10-CM | POA: Diagnosis not present

## 2022-12-04 DIAGNOSIS — R059 Cough, unspecified: Secondary | ICD-10-CM

## 2022-12-04 DIAGNOSIS — J029 Acute pharyngitis, unspecified: Secondary | ICD-10-CM | POA: Diagnosis not present

## 2022-12-04 LAB — POC COVID19 BINAXNOW: SARS Coronavirus 2 Ag: POSITIVE — AB

## 2022-12-04 LAB — POCT RAPID STREP A (OFFICE): Rapid Strep A Screen: POSITIVE — AB

## 2022-12-04 MED ORDER — AMOXICILLIN 500 MG PO CAPS: 1000.0000 mg | ORAL_CAPSULE | Freq: Two times a day (BID) | ORAL | 0 refills | Status: DC

## 2022-12-04 MED ORDER — FLUCONAZOLE 150 MG PO TABS: ORAL_TABLET | ORAL | 0 refills | Status: DC

## 2022-12-04 MED ORDER — NIRMATRELVIR/RITONAVIR (PAXLOVID)TABLET: 3.0000 | ORAL_TABLET | Freq: Two times a day (BID) | ORAL | 0 refills | Status: AC

## 2022-12-13 DIAGNOSIS — M3501 Sicca syndrome with keratoconjunctivitis: Secondary | ICD-10-CM | POA: Diagnosis not present

## 2022-12-13 DIAGNOSIS — H18832 Recurrent erosion of cornea, left eye: Secondary | ICD-10-CM | POA: Diagnosis not present

## 2022-12-19 DIAGNOSIS — R209 Unspecified disturbances of skin sensation: Secondary | ICD-10-CM | POA: Diagnosis not present

## 2022-12-19 DIAGNOSIS — R531 Weakness: Secondary | ICD-10-CM | POA: Diagnosis not present

## 2022-12-19 DIAGNOSIS — R200 Anesthesia of skin: Secondary | ICD-10-CM | POA: Diagnosis not present

## 2023-01-17 ENCOUNTER — Other Ambulatory Visit: Payer: Self-pay | Admitting: Family Medicine

## 2023-01-17 DIAGNOSIS — E7800 Pure hypercholesterolemia, unspecified: Secondary | ICD-10-CM

## 2023-02-14 DIAGNOSIS — H6983 Other specified disorders of Eustachian tube, bilateral: Secondary | ICD-10-CM | POA: Diagnosis not present

## 2023-02-14 DIAGNOSIS — H6122 Impacted cerumen, left ear: Secondary | ICD-10-CM | POA: Diagnosis not present

## 2023-03-13 DIAGNOSIS — H2513 Age-related nuclear cataract, bilateral: Secondary | ICD-10-CM | POA: Diagnosis not present

## 2023-03-13 DIAGNOSIS — M3501 Sicca syndrome with keratoconjunctivitis: Secondary | ICD-10-CM | POA: Diagnosis not present

## 2023-03-27 DIAGNOSIS — R200 Anesthesia of skin: Secondary | ICD-10-CM | POA: Diagnosis not present

## 2023-03-27 DIAGNOSIS — R209 Unspecified disturbances of skin sensation: Secondary | ICD-10-CM | POA: Diagnosis not present

## 2023-03-27 DIAGNOSIS — R531 Weakness: Secondary | ICD-10-CM | POA: Diagnosis not present

## 2023-08-14 ENCOUNTER — Ambulatory Visit: Payer: Medicare Other

## 2023-08-14 VITALS — Ht 63.25 in | Wt 233.0 lb

## 2023-08-14 DIAGNOSIS — Z0000 Encounter for general adult medical examination without abnormal findings: Secondary | ICD-10-CM | POA: Diagnosis not present

## 2023-08-14 NOTE — Patient Instructions (Signed)
Monica Wise , Thank you for taking time to come for your Medicare Wellness Visit. I appreciate your ongoing commitment to your health goals. Please review the following plan we discussed and let me know if I can assist you in the future.   Referrals/Orders/Follow-Ups/Clinician Recommendations: none  This is a list of the screening recommended for you and due dates:  Health Maintenance  Topic Date Due   Zoster (Shingles) Vaccine (1 of 2) 03/10/1963   Flu Shot  01/30/2023   COVID-19 Vaccine (5 - 2024-25 season) 03/02/2023   Mammogram  06/15/2023   DTaP/Tdap/Td vaccine (1 - Tdap) 03/17/2029*   Medicare Annual Wellness Visit  08/13/2024   Pneumonia Vaccine  Completed   DEXA scan (bone density measurement)  Completed   Hepatitis C Screening  Completed   HPV Vaccine  Aged Out   Screening for Lung Cancer  Discontinued   Colon Cancer Screening  Discontinued  *Topic was postponed. The date shown is not the original due date.    Advanced directives: (Copy Requested) Please bring a copy of your health care power of attorney and living will to the office to be added to your chart at your convenience. PT HAS HPOA ONLY  Next Medicare Annual Wellness Visit scheduled for next year: Yes

## 2023-08-14 NOTE — Progress Notes (Signed)
Subjective:   Monica Wise is a 78 y.o. female who presents for Medicare Annual (Subsequent) preventive examination.  Visit Complete: Virtual I connected with  Cecille Amsterdam on 08/14/23 by a audio enabled telemedicine application and verified that I am speaking with the correct person using two identifiers.  Patient Location: Home  Provider Location: Office/Clinic  I discussed the limitations of evaluation and management by telemedicine. The patient expressed understanding and agreed to proceed.  Vital Signs: Because this visit was a virtual/telehealth visit, some criteria may be missing or patient reported. Any vitals not documented were not able to be obtained and vitals that have been documented are patient reported.  Patient Medicare AWV questionnaire was completed by the patient on (not done); I have confirmed that all information answered by patient is correct and no changes since this date.  Cardiac Risk Factors include: advanced age (>87men, >30 women);dyslipidemia;hypertension;obesity (BMI >30kg/m2)     Objective:    Today's Vitals   08/14/23 1548 08/14/23 1549  Weight: 233 lb (105.7 kg)   Height: 5' 3.25" (1.607 m)   PainSc:  6    Body mass index is 40.95 kg/m.     08/14/2023    4:03 PM 08/08/2022    2:07 PM 08/07/2021   12:06 PM 04/26/2020    8:01 AM 03/21/2020    3:33 PM 03/18/2019    1:09 PM 10/09/2017   11:28 AM  Advanced Directives  Does Patient Have a Medical Advance Directive? Yes Yes Yes Yes No No Yes  Type of Estate agent of [employer] Power of [place];Living will Healthcare Power of [employer] Power of [employer] Power of Attorney  Does patient want to make changes to medical advance directive?   Yes (MAU/Ambulatory/Procedural Areas - Information given)      Copy of Healthcare Power of Attorney in Chart? No - copy requested No - copy requested  No - copy requested   No - copy requested  Would patient like  information on creating a medical advance directive?     No - Patient declined No - Patient declined     Current Medications (verified) Outpatient Encounter Medications as of 08/14/2023  Medication Sig   acetaminophen (TYLENOL) 650 MG CR tablet Take 650 mg by mouth every 8 (eight) hours as needed for pain.   albuterol (VENTOLIN HFA) 108 (90 Base) MCG/ACT inhaler Inhale 2 puffs into the lungs every 6 (six) hours as needed for wheezing or shortness of breath.   amoxicillin (AMOXIL) 500 MG capsule Take 2 capsules (1,000 mg total) by mouth 2 (two) times daily.   Apple Cid Vn-Grn Tea-Bit Or-Cr (APPLE CIDER VINEGAR PLUS) TABS Take 2 tablets by mouth 3 (three) times daily before meals.   aspirin-acetaminophen-caffeine (EXCEDRIN MIGRAINE) 250-250-65 MG tablet Take 1 tablet by mouth every 6 (six) hours as needed for headache.   Cholecalciferol (VITAMIN D) 2000 units CAPS Take 1 capsule by mouth daily.   diphenhydramine-acetaminophen (TYLENOL PM) 25-500 MG TABS tablet Take 1 tablet by mouth at bedtime as needed.   ezetimibe (ZETIA) 10 MG tablet TAKE 1 TABLET BY MOUTH EVERY DAY   famotidine (PEPCID) 20 MG tablet Take 20 mg by mouth as needed.    Fexofenadine HCl (ALLEGRA PO) Take by mouth daily.   fluconazole (DIFLUCAN) 150 MG tablet Take 1 tab po today, repeat in 7 days if needed   fluticasone furoate-vilanterol (BREO ELLIPTA) 200-25 MCG/ACT AEPB INHALE 1 PUFF BY MOUTH EVERY DAY  furosemide (LASIX) 20 MG tablet TAKE 1 TABLET (20 MG TOTAL) BY MOUTH DAILY AS NEEDED FOR FLUID.   ibuprofen (ADVIL) 200 MG tablet Take 3-4 tablets (600-800 mg total) by mouth daily as needed.   Loperamide HCl (ANTI-DIARRHEAL PO) Take by mouth.   Magnesium 250 MG TABS Take 1 tablet by mouth daily.   MELATONIN PO Take 5-10 mg by mouth at bedtime.   MISC NATURAL PRODUCTS PO Take by mouth as needed. Bladder Control   MISC NATURAL PRODUCTS PO Take by mouth. Mighty Reds gummies   MISC NATURAL PRODUCTS PO Take by mouth. Mighty Flex  for joint pain   montelukast (SINGULAIR) 10 MG tablet TAKE 1 TABLET BY MOUTH EVERY DAY IN THE EVENING   Multiple Vitamin (MULTIVITAMIN) tablet Take 1 tablet by mouth daily.   potassium chloride (K-DUR) 10 MEQ tablet Take 1 tablet (10 mEq total) by mouth daily as needed. With lasix   pregabalin (LYRICA) 50 MG capsule Take 50 mg by mouth 2 (two) times daily.   telmisartan (MICARDIS) 20 MG tablet TAKE 1 TABLET BY MOUTH EVERY DAY   tiotropium (SPIRIVA HANDIHALER) 18 MCG inhalation capsule INHALE 1 CAPSULE VIA HANDIHALER ONCE DAILY AT THE SAME TIME EVERY DAY   TURMERIC PO Take 1,000 mg by mouth daily.   vitamin B-12 (CYANOCOBALAMIN) 1000 MCG tablet Take 1 tablet (1,000 mcg total) by mouth daily.   gabapentin (NEURONTIN) 400 MG capsule Take 400 mg by mouth 2 (two) times daily. (Patient not taking: Reported on 08/14/2023)   No facility-administered encounter medications on file as of 08/14/2023.    Allergies (verified) Altace [ramipril], Benicar [olmesartan], Codeine, Demerol [meperidine], Statins, and Sulfa antibiotics   History: Past Medical History:  Diagnosis Date   Adrenal adenoma, right 10/18/2016   Incidental by CT 09/2016   Aortic valve sclerosis 2014   withoout stenosis, mild aortic insufficiency - Dr Lady Gary   Asthma    CAD (coronary artery disease) 10/18/2016   By CT 09/2016   Centrilobular emphysema (HCC) 10/18/2016   By CT 09/2016   Chronic diarrhea 2011   worse in am, ?IBS   COPD (chronic obstructive pulmonary disease) (HCC) 2005   chronic bronchitis - spirometry FVC 70%, FEV1 72%, abnormal loop volume (unknown date)   Essential hypertension    Ex-smoker    GERD (gastroesophageal reflux disease)    Heart murmur    History of chicken pox    History of measles    Hyperlipidemia    IBS (irritable bowel syndrome)    Lumbago    Lump or mass in breast 2012   right breast US guided finesse 1 o'clock   Obesity    Osteoarthritis    knees s/p surgeries   Osteopenia 09/2014   mild by  DEXA (-1.2 femur neck)   Perennial allergic rhinitis    Weakness of left upper extremity    Past Surgical History:  Procedure Laterality Date   BREAST BIOPSY Right 2012   BREAST LUMPECTOMY Left 1995   benign mass   CARDIOVASCULAR STRESS TEST  12/2004   nl myoview, preserved LV fxn with EF 71% without reversible ischemia (Fath)   COLONOSCOPY  2010   hyperplastic polyps identified. rpt 10 yrs (Byrnett)   COLONOSCOPY  03/2020   rectal petechiae ([facility]nette)   COLONOSCOPY WITH PROPOFOL N/A 04/26/2020   Procedure: COLONOSCOPY WITH PROPOFOL;  Surgeon: [name], MD;  Location: [facility] ENDOSCOPY;  Service: Endoscopy;  Laterality: N/A;   DEXA  09/2014   T -1.2 femur  neck   ESOPHAGOGASTRODUODENOSCOPY  2010   [facility]   KNEE SURGERY Right 2010   arthroscopic   KNEE SURGERY Left 2014   Family History  Problem Relation Age of Onset   Diverticulitis Mother    Heart failure Mother    Cancer Maternal Aunt        bone, and unsure   CAD Father 66       MI x3   Sudden death Brother 71   Social History   Socioeconomic History   Marital status: Divorced    Spouse name: Not on file   Number of children: 0   Years of education: some college   Highest education level: Not on file  Occupational History   Occupation: Retired  Tobacco Use   Smoking status: Former    Current packs/day: 0.00    Average packs/day: 1 pack/day for 30.0 years (30.0 ttl pk-yrs)    Types: Cigarettes    Start date: 05/31/1978    Quit date: 05/31/2008    Years since quitting: 15.2   Smokeless tobacco: Never  Vaping Use   Vaping status: Never Used  Substance and Sexual Activity   Alcohol use: Yes    Comment: occasionally wine   Drug use: No   Sexual activity: Not Currently  Other Topics Concern   Not on file  Social History Narrative   Lives alone, 2 feral cats   Occupation: retired, worked for [employer]   Edu: Investment banker, corporate   Activity: works in yard, unable to walk 2/2 knee pain   Diet: good water,  fruits/vegetables daily   Right-handed.   No daily use of caffeine. Occasional tea.    Social Drivers of Corporate investment banker Strain: Low Risk  (08/14/2023)   Overall Financial Resource Strain (CARDIA)    Difficulty of Paying Living Expenses: Not hard at all  Food Insecurity: No Food Insecurity (08/14/2023)   Hunger Vital Sign    Worried About Running Out of Food in the Last Year: Never true    Ran Out of Food in the Last Year: Never true  Transportation Needs: No Transportation Needs (08/14/2023)   PRAPARE - Administrator, Civil Service (Medical): No    Lack of Transportation (Non-Medical): No  Physical Activity: Insufficiently Active (08/14/2023)   Exercise Vital Sign    Days of Exercise per Week: 7 days    Minutes of Exercise per Session: 20 min  Stress: No Stress Concern Present (08/14/2023)   [employer] of Occupational Health - Occupational Stress Questionnaire    Feeling of Stress : Only a little  Social Connections: Socially Isolated (08/14/2023)   Social Connection and Isolation Panel [NHANES]    Frequency of Communication with Friends and Family: More than three times a week    Frequency of Social Gatherings with Friends and Family: More than three times a week    Attends Religious Services: Never    Database administrator or Organizations: No    Attends Engineer, structural: Never    Marital Status: Divorced    Tobacco Counseling Counseling given: Not Answered  Clinical Intake:  Pre-visit preparation completed: Yes  Pain : 0-10 Pain Score: 6  Pain Type: Chronic pain Pain Location: Knee (both knees) Pain Descriptors / Indicators: Aching Pain Onset: More than a month ago Pain Frequency: Intermittent Pain Relieving Factors: walking, IBU, move free Effect of Pain on Daily Activities: limits activities  Pain Relieving Factors: walking, IBU, move free  BMI -  recorded: 40.95 Nutritional Status: BMI > 30  Obese Nutritional Risks:  None Diabetes: No  How often do you need to have someone help you when you read instructions, pamphlets, or other written materials from your doctor or pharmacy?: 1 - Never  Interpreter Needed?: No  Comments: lives alone Information entered by :: B.Arris Meyn,LPN   Activities of Daily Living    08/14/2023    4:03 PM  In your present state of health, do you have any difficulty performing the following activities:  Hearing? 0  Vision? 0  Difficulty concentrating or making decisions? 0  Walking or climbing stairs? 1  Dressing or bathing? 0  Doing errands, shopping? 0  Preparing Food and eating ? N  Using the Toilet? N  In the past six months, have you accidently leaked urine? N  Do you have problems with loss of bowel control? N  Managing your Medications? N  Managing your Finances? N  Housekeeping or managing your Housekeeping? N    Patient Care Team: Eustaquio Boyden, MD as PCP - General (Family Medicine) Lemar Livings, Merrily Pew, MD (General Surgery) Evelene Croon, MD (Family Medicine) Hannah Beat, MD as Consulting Physician (Orthopedic Surgery) Galen Manila, MD as Referring Physician (Ophthalmology)  Indicate any recent Medical Services you may have received from other than [facility] providers in the past year (date may be approximate).     Assessment:   This is a routine wellness examination for Dasha.  Hearing/Vision screen Hearing Screening - Comments:: Pt says she hears good Vision Screening - Comments:: Pt says her vision is ok Dr Druscilla Brownie   Goals Addressed             This Visit's Progress    Patient Stated   On track    08/14/23-I will maintain and continue medications as prescribed.      Patient Stated       08/14/23-Be as healthy as I can      safety   On track    08/14/23-pt met goal but will continue  I will continue to use cane as needed to reduce risk of falls.        Depression Screen    08/14/2023    3:58 PM 08/23/2022   12:43 PM  08/08/2022    2:05 PM 08/07/2021   12:09 PM 03/21/2020    3:35 PM 03/18/2019    1:11 PM 10/09/2017   11:24 AM  PHQ 2/9 Scores  PHQ - 2 Score 0 3 0 0 0 0 2  PHQ- 9 Score  9   0 0 7    Fall Risk    08/14/2023    3:52 PM 08/23/2022   12:43 PM 08/08/2022    2:09 PM 08/07/2021   12:08 PM 03/21/2020    3:35 PM  Fall Risk   Falls in the past year? 0 0 0 0 0  Number falls in past yr: 0  0 0 0  Injury with Fall? 0  0 0 0  Risk for fall due to : No Fall Risks  Impaired vision  Medication side effect  Follow up Education provided;Falls prevention discussed  Falls prevention discussed Falls prevention discussed Falls evaluation completed;Falls prevention discussed    MEDICARE RISK AT HOME: Medicare Risk at Home Any stairs in or around the home?: Yes If so, are there any without handrails?: Yes Home free of loose throw rugs in walkways, pet beds, electrical cords, etc?: Yes Adequate lighting in your home to reduce risk of falls?: Yes  Life alert?: No Use of a cane, walker or w/c?: Yes (cane) Grab bars in the bathroom?: No Shower chair or bench in shower?: Yes Elevated toilet seat or a handicapped toilet?: No  TIMED UP AND GO:  Was the test performed?  No    Cognitive Function:    03/21/2020    3:37 PM 03/18/2019    1:17 PM 10/09/2017   11:26 AM 10/03/2016    2:21 PM  MMSE - Mini Mental State Exam  Orientation to time 5 5 5 5   Orientation to Place 5 5 5 5   Registration 3 3 3 3   Attention/ Calculation 5 5 0 0  Recall 3 3 3 3   Language- name 2 objects   0 0  Language- repeat 1 1 1 1   Language- follow 3 step command   3 3  Language- read & follow direction   0 0  Write a sentence   0 0  Copy design   0 0  Total score   20 20        08/14/2023    4:08 PM 08/08/2022    2:11 PM  6CIT Screen  What Year? 0 points 0 points  What month? 0 points 0 points  What time? 0 points 0 points  Count back from 20 0 points 0 points  Months in reverse 0 points 0 points  Repeat phrase 0 points 0 points   Total Score 0 points 0 points    Immunizations Immunization History  Administered Date(s) Administered   Fluad Quad(high Dose 65+) 04/06/2019, 03/22/2020   Influenza, High Dose Seasonal PF 06/12/2017, 04/18/2018, 05/08/2021   Influenza-Unspecified 03/31/2014, 03/02/2015, 05/22/2016   PFIZER(Purple Top)SARS-COV-2 Vaccination 07/28/2019, 08/18/2019, 11/25/2019, 04/20/2020   Pneumococcal Conjugate-13 10/03/2015   Pneumococcal Polysaccharide-23 10/03/2016   Pneumococcal-Unspecified 07/02/1999   Zoster, Live 07/04/2014    TDAP status: Up to date  Flu Vaccine status: Up to date pt says had at pharmacy 05/01/2023  Pneumococcal vaccine status: Up to date  Covid-19 vaccine status: Completed vaccines  Qualifies for Shingles Vaccine? Yes   Zostavax completed No   Shingrix Completed?: No.    Education has been provided regarding the importance of this vaccine. Patient has been advised to call insurance company to determine out of pocket expense if they have not yet received this vaccine. Advised may also receive vaccine at local pharmacy or Health Dept. Verbalized acceptance and understanding.  Screening Tests Health Maintenance  Topic Date Due   Zoster Vaccines- Shingrix (1 of 2) 03/10/1965   INFLUENZA VACCINE  01/30/2023   COVID-19 Vaccine (5 - 2024-25 season) 03/02/2023   MAMMOGRAM  06/15/2023   DTaP/Tdap/Td (1 - Tdap) 03/17/2029 (Originally 03/10/1963)   Medicare Annual Wellness (AWV)  08/13/2024   Pneumonia Vaccine 81+ Years old  Completed   DEXA SCAN  Completed   Hepatitis C Screening  Completed   HPV VACCINES  Aged Out   Lung Cancer Screening  Discontinued   Colonoscopy  Discontinued    Health Maintenance  Health Maintenance Due  Topic Date Due   Zoster Vaccines- Shingrix (1 of 2) 03/10/1963   INFLUENZA VACCINE  01/30/2023   COVID-19 Vaccine (5 - 2024-25 season) 03/02/2023   MAMMOGRAM  06/15/2023    Colorectal cancer screening: No longer required.   Mammogram  status: No longer required due to age.  Bone Density status: Completed 10/18/2014. Results reflect: Bone density results: OSTEOPENIA. Repeat every 3-5 years.  Lung Cancer Screening: (Low Dose CT Chest recommended if Age 85-80 years,  20 pack-year currently smoking OR have quit w/in 15years.) does not qualify.   Lung Cancer Screening Referral: no  Additional Screening:  Hepatitis C Screening: does not qualify; Completed 09/18/2015  Vision Screening: Recommended annual ophthalmology exams for early detection of glaucoma and other disorders of the eye. Is the patient up to date with their annual eye exam?  Yes  Who is the provider or what is the name of the office in which the patient attends annual eye exams? Dr Druscilla Brownie If pt is not established with a provider, would they like to be referred to a provider to establish care? No .   Dental Screening: Recommended annual dental exams for proper oral hygiene  Diabetic Foot Exam: n/a  Community Resource Referral / Chronic Care Management: CRR required this visit?  No   CCM required this visit?  Appt scheduled with PCP CPE     Plan:     I have personally reviewed and noted the following in the patient's chart:   Medical and social history Use of alcohol, tobacco or illicit drugs  Current medications and supplements including opioid prescriptions. Patient is not currently taking opioid prescriptions. Functional ability and status Nutritional status Physical activity Advanced directives List of other physicians Hospitalizations, surgeries, and ER visits in previous 12 months Vitals Screenings to include cognitive, depression, and falls Referrals and appointments  In addition, I have reviewed and discussed with patient certain preventive protocols, quality metrics, and best practice recommendations. A written personalized care plan for preventive services as well as general preventive health recommendations were provided to patient.     Sue Lush, LPN   1/61/0960   After Visit Summary: (Declined) Due to this being a telephonic visit, with patients personalized plan was offered to patient but patient Declined AVS at this time   Nurse Notes: The patient states she is doing well and has no concerns or questions at this time.

## 2023-08-29 DIAGNOSIS — Z1231 Encounter for screening mammogram for malignant neoplasm of breast: Secondary | ICD-10-CM | POA: Diagnosis not present

## 2023-08-29 LAB — HM MAMMOGRAPHY

## 2023-09-09 ENCOUNTER — Telehealth: Payer: Self-pay

## 2023-09-09 NOTE — Telephone Encounter (Signed)
 Copied from CRM [id number]. Topic: Clinical - Lab/Test Results >> Sep 09, 2023 12:28 PM Sim Boast F wrote: Reason for CRM: Patient returning Felicia's phone call regarding mammogram results

## 2023-09-09 NOTE — Telephone Encounter (Signed)
 Spoke with pt (see Imaging, Result Notes- 08/29/23).

## 2023-09-10 ENCOUNTER — Telehealth: Payer: Self-pay | Admitting: Family Medicine

## 2023-09-10 NOTE — Telephone Encounter (Signed)
Signed and in LIsa's box.  

## 2023-09-10 NOTE — Telephone Encounter (Signed)
 Faxed order to [facility] at [phone].

## 2023-09-10 NOTE — Telephone Encounter (Signed)
 See other 09/10/23 phn note.

## 2023-09-10 NOTE — Telephone Encounter (Signed)
 Spoke with Mindi Junker at [facility] at [phone] asking about pt scheduling diagnostic mammo. She states they do not do diagnostic imaging at their facility. Pt would need to have it done at [facility] or main location and Mindi Junker offered to transfer call but provided following phn # in case we got disconnected: [phone], opt 2.   Spoke with scheduler explaining situation and about getting diagnostic imaging. Told pt needs new order for diagnostic 3D mammogram on L breast with L breast US. Order should then be faxed to [facility] at [phone]. Once order is received pt will be contacted to get scheduled.

## 2023-09-10 NOTE — Telephone Encounter (Signed)
 Spoke with pt notifying her we received the order and faxed back to [facility] and they will contact her to get scheduled. Pt verbalizes understanding and expresses her thanks.

## 2023-09-10 NOTE — Telephone Encounter (Signed)
 Copied from CRM [id number]. Topic: Referral - Question >> Sep 10, 2023  2:41 PM [place] J wrote: Reason for CRM: Patient is having an issue with her mammogram being scheduled due to the clinic needing more information. Clinic is unable to schedule until this fixed. Patient would like to get in contact with Dr. Nicanor Alcon nurse for help. Please call patient back.

## 2023-09-10 NOTE — Telephone Encounter (Signed)
 Copied from CRM [id number]. Topic: General - Other >> Sep 10, 2023  2:55 PM Pascal Lux wrote: Reason for CRM: Cordelia Pen from [facility] calling to see if provider received the forms. Called [facility] and advised Cordelia Pen to refax the forms.

## 2023-09-10 NOTE — Telephone Encounter (Signed)
 Received faxed order. Placed order in Dr Timoteo Expose box.

## 2023-09-24 DIAGNOSIS — N6320 Unspecified lump in the left breast, unspecified quadrant: Secondary | ICD-10-CM | POA: Diagnosis not present

## 2023-09-24 DIAGNOSIS — R928 Other abnormal and inconclusive findings on diagnostic imaging of breast: Secondary | ICD-10-CM | POA: Diagnosis not present

## 2023-09-24 DIAGNOSIS — N6321 Unspecified lump in the left breast, upper outer quadrant: Secondary | ICD-10-CM | POA: Diagnosis not present

## 2023-10-04 ENCOUNTER — Other Ambulatory Visit: Payer: Self-pay | Admitting: Family Medicine

## 2023-10-04 DIAGNOSIS — J432 Centrilobular emphysema: Secondary | ICD-10-CM

## 2023-10-06 NOTE — Telephone Encounter (Signed)
 Pt has CPE tomorrow. Will send refill at [facility].

## 2023-10-07 ENCOUNTER — Encounter: Payer: Self-pay | Admitting: Family Medicine

## 2023-10-07 ENCOUNTER — Ambulatory Visit (INDEPENDENT_AMBULATORY_CARE_PROVIDER_SITE_OTHER): Payer: Medicare Other | Admitting: Family Medicine

## 2023-10-07 VITALS — BP 134/70 | HR 65 | Temp 97.8°F | Ht 63.5 in | Wt 231.1 lb

## 2023-10-07 DIAGNOSIS — E7800 Pure hypercholesterolemia, unspecified: Secondary | ICD-10-CM | POA: Diagnosis not present

## 2023-10-07 DIAGNOSIS — M810 Age-related osteoporosis without current pathological fracture: Secondary | ICD-10-CM

## 2023-10-07 DIAGNOSIS — K219 Gastro-esophageal reflux disease without esophagitis: Secondary | ICD-10-CM

## 2023-10-07 DIAGNOSIS — G720 Drug-induced myopathy: Secondary | ICD-10-CM

## 2023-10-07 DIAGNOSIS — N6320 Unspecified lump in the left breast, unspecified quadrant: Secondary | ICD-10-CM

## 2023-10-07 DIAGNOSIS — I6523 Occlusion and stenosis of bilateral carotid arteries: Secondary | ICD-10-CM | POA: Diagnosis not present

## 2023-10-07 DIAGNOSIS — Z0000 Encounter for general adult medical examination without abnormal findings: Secondary | ICD-10-CM | POA: Diagnosis not present

## 2023-10-07 DIAGNOSIS — I350 Nonrheumatic aortic (valve) stenosis: Secondary | ICD-10-CM | POA: Diagnosis not present

## 2023-10-07 DIAGNOSIS — J3089 Other allergic rhinitis: Secondary | ICD-10-CM | POA: Diagnosis not present

## 2023-10-07 DIAGNOSIS — C50912 Malignant neoplasm of unspecified site of left female breast: Secondary | ICD-10-CM | POA: Insufficient documentation

## 2023-10-07 DIAGNOSIS — Z6841 Body Mass Index (BMI) 40.0 and over, adult: Secondary | ICD-10-CM

## 2023-10-07 DIAGNOSIS — I10 Essential (primary) hypertension: Secondary | ICD-10-CM | POA: Diagnosis not present

## 2023-10-07 DIAGNOSIS — J432 Centrilobular emphysema: Secondary | ICD-10-CM

## 2023-10-07 DIAGNOSIS — Z87891 Personal history of nicotine dependence: Secondary | ICD-10-CM | POA: Diagnosis not present

## 2023-10-07 DIAGNOSIS — Z7189 Other specified counseling: Secondary | ICD-10-CM

## 2023-10-07 MED ORDER — TELMISARTAN 20 MG PO TABS: 20.0000 mg | ORAL_TABLET | Freq: Every day | ORAL | 4 refills | Status: AC

## 2023-10-07 MED ORDER — EZETIMIBE 10 MG PO TABS: 10.0000 mg | ORAL_TABLET | Freq: Every day | ORAL | 4 refills | Status: DC

## 2023-10-07 MED ORDER — POTASSIUM CHLORIDE ER 10 MEQ PO TBCR: 10.0000 meq | EXTENDED_RELEASE_TABLET | Freq: Every day | ORAL | 1 refills | Status: AC | PRN

## 2023-10-07 MED ORDER — FUROSEMIDE 20 MG PO TABS: 20.0000 mg | ORAL_TABLET | Freq: Every day | ORAL | 1 refills | Status: AC | PRN

## 2023-10-07 MED ORDER — TIOTROPIUM BROMIDE MONOHYDRATE 18 MCG IN CAPS: ORAL_CAPSULE | RESPIRATORY_TRACT | 10 refills | Status: AC

## 2023-10-07 MED ORDER — MONTELUKAST SODIUM 10 MG PO TABS
ORAL_TABLET | ORAL | 4 refills | Status: AC
Start: 2023-10-07 — End: ?

## 2023-10-07 MED ORDER — BREO ELLIPTA 200-25 MCG/ACT IN AEPB: 1.0000 | INHALATION_SPRAY | Freq: Every day | RESPIRATORY_TRACT | 10 refills | Status: AC

## 2023-10-07 NOTE — Assessment & Plan Note (Signed)
 Preventative protocols reviewed and updated unless pt declined. Discussed healthy diet and lifestyle.

## 2023-10-07 NOTE — Patient Instructions (Addendum)
 Schedule fasting labs at you convenience over next 1-2 weeks.  We will await biopsy results next week.  I will order updated heart ultrasound.  Good to see you today  Return as needed or in 1 year for next physical.

## 2023-10-07 NOTE — Assessment & Plan Note (Signed)
 BIRADS-5 suspicious appearing spiculated mass L breast recommend biopsy - this is scheduled for 10/14/2023 at [facility] imaging.

## 2023-10-07 NOTE — Assessment & Plan Note (Signed)
 Previously discussed.

## 2023-10-07 NOTE — Progress Notes (Unsigned)
 Ph: [phone] Fax: [phone]   Patient ID: Monica Wise, female    DOB: [birth year], 78 y.o.   MRN: [id number]  This visit was conducted in person.  BP 134/70   Pulse 65   Temp 97.8 F (36.6 C) (Oral)   Ht 5' 3.5" (1.613 m)   Wt 231 lb 2 oz (104.8 kg)   SpO2 94%   BMI 40.30 kg/m    CC: CPE Subjective:   HPI: Monica Wise is a 78 y.o. female presenting on 10/07/2023 for Annual Exam (MCR prt 2 [AWV- 08/14/23].)   Saw health advisor 08/2023 for medicare wellness visit. Note reviewed.    No results found.  Flowsheet Row Office Visit from 10/07/2023 in [facility] [place]  PHQ-2 Total Score 0          10/07/2023    2:55 PM 08/14/2023    3:52 PM 08/23/2022   12:43 PM 08/08/2022    2:09 PM 08/07/2021   12:08 PM  Fall Risk   Falls in the past year? 1 0 0 0 0  Number falls in past yr: 0 0  0 0  Injury with Fall? 1 0  0 0  Risk for fall due to :  No Fall Risks  Impaired vision   Follow up  Education provided;Falls prevention discussed  Falls prevention discussed Falls prevention discussed  Had fall 08/21/2023 - tripped over rubber mat while walking into her house. She did hit her head. Stubbed L ring finger as well. She is not on blood thinners.   Recent L breast mass: BIRADS-5 suspicious appearing spiculated mass L breast recommend biopsy - this is scheduled for 10/14/2023 at [facility].   H/o psoriasis - remotely saw [facility] dermatology. No recent outbreaks.  Known aortic valve sclerosis. Latest echo at [facility] clinic 03/2021 reviewed from [facility] - normal LV sys fx with mild LVH, normal RV sys fx, mild AS.    COPD - continues spiriva along with breo ellipta   On lyrica 50mg  bid through [facility] with [facility] neurology.   Preventative: COLONOSCOPY Date: 2010 hyperplastic polyps, rpt 10 yrs ([place]) COLONOSCOPY 03/2020 - rectal petechiae ([place])  Mammograms done @ [facility] imaging - see above.  Well woman exam - remotely. Declines further eval.  G1P1.  DEXA Date: 09/2014 T -1.2 femur neck  Lung cancer screening yearly, latest 07/2022 - centrilobular and paraseptal emphysema, aortic ATH, calcified CA  Flu shot - yearly  COVID vaccine - Pfizer 07/2019, 08/2019, booster 03/2020, 10/2020 Pneumovax 2001, prevnar 09/2015, pneumovax 09/2016  Zostavax 07/2014  Shingrix - discussed, declines  Advanced directive: reviewed with patient - son is HCPOA. Has discussed her wishes with her son and a local cousin. Does not want to complete advanced directive but wants to be full code, no prolonged life support if terminal condition.  Seat belt use discussed  Sunscreen use discussed. No changing moles on skin.  Ex smoker - quit 2009. 30 PY hx.  Alcohol - few glasses of wine per week  Bowel - no constipation  Bladder - no incontinence   Lives alone, son nearby Occupation: retired, worked for [employer]: [employer] Activity: works in yard, some walking limited by knee pain Diet: good water, fruits/vegetables daily      Relevant past medical, surgical, family and social history reviewed and updated as indicated. Interim medical history since our last visit reviewed. Allergies and medications reviewed and updated. Outpatient Medications Prior to Visit  Medication Sig  Dispense Refill   acetaminophen (TYLENOL) 650 MG CR tablet Take 650 mg by mouth every 8 (eight) hours as needed for pain.     albuterol (VENTOLIN HFA) 108 (90 Base) MCG/ACT inhaler Inhale 2 puffs into the lungs every 6 (six) hours as needed for wheezing or shortness of breath. 8 g 2   Apple Cid Vn-Grn Tea-Bit Or-Cr (APPLE CIDER VINEGAR PLUS) TABS Take 2 tablets by mouth 3 (three) times daily before meals.     aspirin-acetaminophen-caffeine (EXCEDRIN MIGRAINE) 250-250-65 MG tablet Take 1 tablet by mouth every 6 (six) hours as needed for headache.     Cholecalciferol (VITAMIN D) 2000 units CAPS Take 1 capsule by mouth daily.     diphenhydramine-acetaminophen (TYLENOL PM) 25-500 MG TABS  tablet Take 1 tablet by mouth at bedtime as needed.     famotidine (PEPCID) 20 MG tablet Take 20 mg by mouth as needed.      Fexofenadine HCl (ALLEGRA PO) Take by mouth daily.     ibuprofen (ADVIL) 200 MG tablet Take 3-4 tablets (600-800 mg total) by mouth daily as needed.     Loperamide HCl (ANTI-DIARRHEAL PO) Take by mouth.     Magnesium 250 MG TABS Take 1 tablet by mouth daily.     MELATONIN PO Take 5-10 mg by mouth at bedtime.     MISC NATURAL PRODUCTS PO Take by mouth as needed. Bladder Control     Multiple Vitamin (MULTIVITAMIN) tablet Take 1 tablet by mouth daily.     pregabalin (LYRICA) 50 MG capsule Take 50 mg by mouth 2 (two) times daily.     TURMERIC PO Take 1,000 mg by mouth daily.     ezetimibe (ZETIA) 10 MG tablet TAKE 1 TABLET BY MOUTH EVERY DAY 90 tablet 2   fluticasone furoate-vilanterol (BREO ELLIPTA) 200-25 MCG/ACT AEPB INHALE 1 PUFF BY MOUTH EVERY DAY 60 each 12   furosemide (LASIX) 20 MG tablet TAKE 1 TABLET (20 MG TOTAL) BY MOUTH DAILY AS NEEDED FOR FLUID. 30 tablet 0   montelukast (SINGULAIR) 10 MG tablet TAKE 1 TABLET BY MOUTH EVERY DAY IN THE EVENING 90 tablet 4   potassium chloride (K-DUR) 10 MEQ tablet Take 1 tablet (10 mEq total) by mouth daily as needed. With lasix 30 tablet 2   telmisartan (MICARDIS) 20 MG tablet TAKE 1 TABLET BY MOUTH EVERY DAY 90 tablet 3   tiotropium (SPIRIVA HANDIHALER) 18 MCG inhalation capsule INHALE 1 CAPSULE VIA HANDIHALER ONCE DAILY AT THE SAME TIME EVERY DAY 30 capsule 11   amoxicillin (AMOXIL) 500 MG capsule Take 2 capsules (1,000 mg total) by mouth 2 (two) times daily. 40 capsule 0   fluconazole (DIFLUCAN) 150 MG tablet Take 1 tab po today, repeat in 7 days if needed 2 tablet 0   gabapentin (NEURONTIN) 400 MG capsule Take 400 mg by mouth 2 (two) times daily.     MISC NATURAL PRODUCTS PO Take by mouth. Mighty Reds gummies     MISC NATURAL PRODUCTS PO Take by mouth. Mighty Flex for joint pain     vitamin B-12 (CYANOCOBALAMIN) 1000 MCG  tablet Take 1 tablet (1,000 mcg total) by mouth daily.     No facility-administered medications prior to visit.     Per HPI unless specifically indicated in ROS section below Review of Systems  Constitutional:  Negative for activity change, appetite change, chills, fatigue, fever and unexpected weight change.  HENT:  Negative for hearing loss.   Eyes:  Negative for visual disturbance.  Respiratory:  Positive for shortness of breath. Negative for cough, chest tightness and wheezing.   Cardiovascular:  Negative for chest pain, palpitations and leg swelling.  Gastrointestinal:  Negative for abdominal distention, abdominal pain, blood in stool, constipation, diarrhea, nausea and vomiting.  Genitourinary:  Negative for difficulty urinating and hematuria.  Musculoskeletal:  Negative for arthralgias, myalgias and neck pain.  Skin:  Negative for rash.  Neurological:  Negative for dizziness, seizures, syncope and headaches.  Hematological:  Negative for adenopathy. Does not bruise/bleed easily.  Psychiatric/Behavioral:  Positive for dysphoric mood. The patient is not nervous/anxious.     Objective:  BP 134/70   Pulse 65   Temp 97.8 F (36.6 C) (Oral)   Ht 5' 3.5" (1.613 m)   Wt 231 lb 2 oz (104.8 kg)   SpO2 94%   BMI 40.30 kg/m   Wt Readings from Last 3 Encounters:  10/07/23 231 lb 2 oz (104.8 kg)  08/14/23 233 lb (105.7 kg)  12/04/22 233 lb 2 oz (105.7 kg)      Physical Exam Vitals and nursing note reviewed.  Constitutional:      Appearance: Normal appearance. She is not ill-appearing.  HENT:     Head: Normocephalic and atraumatic.     Right Ear: Tympanic membrane, ear canal and external ear normal. There is no impacted cerumen.     Left Ear: Tympanic membrane, ear canal and external ear normal. There is no impacted cerumen.     Mouth/Throat:     Mouth: Mucous membranes are moist.     Pharynx: Oropharynx is clear. No oropharyngeal exudate or posterior oropharyngeal erythema.   Eyes:     General:        Right eye: No discharge.        Left eye: No discharge.     Extraocular Movements: Extraocular movements intact.     Conjunctiva/sclera: Conjunctivae normal.     Pupils: Pupils are equal, round, and reactive to light.  Neck:     Thyroid: No thyroid mass or thyromegaly.     Vascular: Carotid bruit (?referred) present.  Cardiovascular:     Rate and Rhythm: Normal rate and regular rhythm.     Pulses: Normal pulses.     Heart sounds: Murmur (3/6 systolic USB) heard.  Pulmonary:     Effort: Pulmonary effort is normal. No respiratory distress.     Breath sounds: Normal breath sounds. No wheezing, rhonchi or rales.  Abdominal:     General: Bowel sounds are normal. There is no distension.     Palpations: Abdomen is soft. There is no mass.     Tenderness: There is no abdominal tenderness. There is no guarding or rebound.     Hernia: No hernia is present.  Musculoskeletal:     Cervical back: Normal range of motion and neck supple. No rigidity.     Right lower leg: No edema.     Left lower leg: No edema.  Lymphadenopathy:     Cervical: No cervical adenopathy.  Skin:    General: Skin is warm and dry.     Findings: No rash.  Neurological:     General: No focal deficit present.     Mental Status: She is alert. Mental status is at baseline.  Psychiatric:        Mood and Affect: Mood normal.        Behavior: Behavior normal.       Results for orders placed or performed in visit on 09/01/23  [facility] MAMMOGRAPHY  Collection Time: 08/29/23  3:09 PM  Result Value Ref Range   HM Mammogram 0-4 Bi-Rad 0-4 Bi-Rad, Self Reported Normal    Assessment & Plan:   Problem List Items Addressed This Visit     Advanced care planning/counseling discussion (Chronic)   Previously discussed      Health maintenance examination - Primary (Chronic)   Preventative protocols reviewed and updated unless pt declined. Discussed healthy diet and lifestyle.       Perennial  allergic rhinitis   Relevant Medications   montelukast (SINGULAIR) 10 MG tablet   Centrilobular emphysema (HCC)   Relevant Medications   fluticasone furoate-vilanterol (BREO ELLIPTA) 200-25 MCG/ACT AEPB   montelukast (SINGULAIR) 10 MG tablet   tiotropium (SPIRIVA HANDIHALER) 18 MCG inhalation capsule   Hyperlipidemia   Relevant Medications   ezetimibe (ZETIA) 10 MG tablet   telmisartan (MICARDIS) 20 MG tablet   furosemide (LASIX) 20 MG tablet   Essential hypertension   Relevant Medications   ezetimibe (ZETIA) 10 MG tablet   telmisartan (MICARDIS) 20 MG tablet   furosemide (LASIX) 20 MG tablet   Breast mass, left   BIRADS-5 suspicious appearing spiculated mass L breast recommend biopsy - this is scheduled for 10/14/2023 at [facility] imaging.       Other Visit Diagnoses       Aortic valve stenosis, etiology of cardiac valve disease unspecified       Relevant Medications   ezetimibe (ZETIA) 10 MG tablet   telmisartan (MICARDIS) 20 MG tablet   furosemide (LASIX) 20 MG tablet   Other Relevant Orders   ECHOCARDIOGRAM COMPLETE        Meds ordered this encounter  Medications   fluticasone furoate-vilanterol (BREO ELLIPTA) 200-25 MCG/ACT AEPB    Sig: Inhale 1 puff into the lungs daily.    Dispense:  60 each    Refill:  10   ezetimibe (ZETIA) 10 MG tablet    Sig: Take 1 tablet (10 mg total) by mouth daily.    Dispense:  90 tablet    Refill:  4   montelukast (SINGULAIR) 10 MG tablet    Sig: TAKE 1 TABLET BY MOUTH EVERY DAY IN THE EVENING    Dispense:  90 tablet    Refill:  4   potassium chloride (KLOR-CON) 10 MEQ tablet    Sig: Take 1 tablet (10 mEq total) by mouth daily as needed. With lasix    Dispense:  30 tablet    Refill:  1   telmisartan (MICARDIS) 20 MG tablet    Sig: Take 1 tablet (20 mg total) by mouth daily.    Dispense:  90 tablet    Refill:  4   tiotropium (SPIRIVA HANDIHALER) 18 MCG inhalation capsule    Sig: INHALE 1 CAPSULE VIA HANDIHALER ONCE DAILY AT THE SAME  TIME EVERY DAY    Dispense:  30 capsule    Refill:  10   furosemide (LASIX) 20 MG tablet    Sig: Take 1 tablet (20 mg total) by mouth daily as needed for fluid.    Dispense:  30 tablet    Refill:  1    Orders Placed This Encounter  Procedures   ECHOCARDIOGRAM COMPLETE    Standing Status:   Future    Expiration Date:   10/06/2024    Where should this test be performed:   [facility] [place]    Perflutren DEFINITY (image enhancing agent) should be administered unless hypersensitivity or allergy exist:   Administer Perflutren  Reason for exam-Echo:   Murmur R01.1    Patient Instructions  Schedule fasting labs at you convenience over next 1-2 weeks.  We will await biopsy results next week.  I will order updated heart ultrasound.  Good to see you today  Return as needed or in 1 year for next physical.   Follow up plan: Return in about 1 year (around 10/06/2024) for annual exam, prior fasting for blood work, medicare wellness visit.  Eustaquio Boyden, MD

## 2023-10-09 ENCOUNTER — Encounter: Payer: Self-pay | Admitting: Family Medicine

## 2023-10-09 NOTE — Assessment & Plan Note (Addendum)
 Continue vit D 2000 international units daily.  H/o compression fracture - consider updated DEXA next year.

## 2023-10-09 NOTE — Assessment & Plan Note (Addendum)
 Chronic, update FLP when she returns fasting. Only on ezetimibe. The 10-year ASCVD risk score (Arnett DK, et al., 2019) is: 34.4%   Values used to calculate the score:     Age: 78 years     Sex: Female     Is Non-Hispanic African American: No     Diabetic: No     Tobacco smoker: No     Systolic Blood Pressure: 134 mmHg     Is BP treated: Yes     HDL Cholesterol: 67.8 mg/dL     Total Cholesterol: 220 mg/dL

## 2023-10-09 NOTE — Assessment & Plan Note (Signed)
 Obesity complicated by comorbidities of HTN, HLD, OA, COPD

## 2023-10-09 NOTE — Assessment & Plan Note (Signed)
Stable period on PRN pepcid.  

## 2023-10-09 NOTE — Assessment & Plan Note (Signed)
 Quit smoking 2009 No longer eligible for lung cancer screening CT program as 15 yrs since quitting.

## 2023-10-09 NOTE — Assessment & Plan Note (Signed)
 Chronic, stable on breo and spiriva.

## 2023-10-09 NOTE — Assessment & Plan Note (Addendum)
 Mild stenosis with moderate sclerosis on last echocardiogram 2022.  Louder murmur heard today - will update echocardiogram Previously saw Dr Lady Gary until he retired - has not established with new cardiologist

## 2023-10-09 NOTE — Assessment & Plan Note (Signed)
 Previous ultrasounds have shown mild bilateral stenosis (1-39%). Anticipate current bruit heard related to referred cardiac murmur - see above.

## 2023-10-09 NOTE — Assessment & Plan Note (Signed)
 Chronic, stable on telmisartan -continue

## 2023-10-09 NOTE — Assessment & Plan Note (Signed)
 Continue singulair, allegra.

## 2023-10-10 ENCOUNTER — Other Ambulatory Visit: Payer: Self-pay | Admitting: Family Medicine

## 2023-10-10 ENCOUNTER — Other Ambulatory Visit (INDEPENDENT_AMBULATORY_CARE_PROVIDER_SITE_OTHER)

## 2023-10-10 DIAGNOSIS — J3089 Other allergic rhinitis: Secondary | ICD-10-CM

## 2023-10-10 DIAGNOSIS — M810 Age-related osteoporosis without current pathological fracture: Secondary | ICD-10-CM

## 2023-10-10 DIAGNOSIS — E7800 Pure hypercholesterolemia, unspecified: Secondary | ICD-10-CM

## 2023-10-10 DIAGNOSIS — I350 Nonrheumatic aortic (valve) stenosis: Secondary | ICD-10-CM | POA: Diagnosis not present

## 2023-10-10 LAB — CBC WITH DIFFERENTIAL/PLATELET
Basophils Absolute: 0.1 10*3/uL (ref 0.0–0.1)
Basophils Relative: 1.1 % (ref 0.0–3.0)
Eosinophils Absolute: 0.2 10*3/uL (ref 0.0–0.7)
Eosinophils Relative: 3.6 % (ref 0.0–5.0)
HCT: 39.8 % (ref 36.0–46.0)
Hemoglobin: 13.2 g/dL (ref 12.0–15.0)
Lymphocytes Relative: 36.1 % (ref 12.0–46.0)
Lymphs Abs: 2.1 10*3/uL (ref 0.7–4.0)
MCHC: 33 g/dL (ref 30.0–36.0)
MCV: 91.5 fl (ref 78.0–100.0)
Monocytes Absolute: 0.4 10*3/uL (ref 0.1–1.0)
Monocytes Relative: 7.2 % (ref 3.0–12.0)
Neutro Abs: 3 10*3/uL (ref 1.4–7.7)
Neutrophils Relative %: 52 % (ref 43.0–77.0)
Platelets: 231 10*3/uL (ref 150.0–400.0)
RBC: 4.35 Mil/uL (ref 3.87–5.11)
RDW: 13.7 % (ref 11.5–15.5)
WBC: 5.7 10*3/uL (ref 4.0–10.5)

## 2023-10-10 LAB — COMPREHENSIVE METABOLIC PANEL WITH GFR
ALT: 13 U/L (ref 0–35)
AST: 15 U/L (ref 0–37)
Albumin: 4.3 g/dL (ref 3.5–5.2)
Alkaline Phosphatase: 66 U/L (ref 39–117)
BUN: 15 mg/dL (ref 6–23)
CO2: 31 meq/L (ref 19–32)
Calcium: 9.5 mg/dL (ref 8.4–10.5)
Chloride: 104 meq/L (ref 96–112)
Creatinine, Ser: 1.07 mg/dL (ref 0.40–1.20)
GFR: 49.38 mL/min — ABNORMAL LOW (ref >60.00)
Glucose, Bld: 121 mg/dL — ABNORMAL HIGH (ref 70–99)
Potassium: 4.1 meq/L (ref 3.5–5.1)
Sodium: 143 meq/L (ref 135–145)
Total Bilirubin: 0.6 mg/dL (ref 0.2–1.2)
Total Protein: 7.3 g/dL (ref 6.0–8.3)

## 2023-10-10 LAB — TSH: TSH: 2.19 u[IU]/mL (ref 0.35–5.50)

## 2023-10-10 LAB — LIPID PANEL
Cholesterol: 217 mg/dL — ABNORMAL HIGH (ref 0–200)
HDL: 65.4 mg/dL (ref >39.00)
LDL Cholesterol: 130 mg/dL — ABNORMAL HIGH (ref 0–99)
NonHDL: 151.15
Total CHOL/HDL Ratio: 3
Triglycerides: 106 mg/dL (ref 0.0–149.0)
VLDL: 21.2 mg/dL (ref 0.0–40.0)

## 2023-10-10 LAB — VITAMIN D 25 HYDROXY (VIT D DEFICIENCY, FRACTURES): VITD: 30.29 ng/mL (ref 30.00–100.00)

## 2023-10-16 ENCOUNTER — Telehealth: Payer: Self-pay | Admitting: Family Medicine

## 2023-10-16 DIAGNOSIS — C50912 Malignant neoplasm of unspecified site of left female breast: Secondary | ICD-10-CM

## 2023-10-16 NOTE — Telephone Encounter (Signed)
 Per Rice Chamorro, pt rtn call.   Spoke with pt asking about breast bx results. States she thought she was scheduled yesterday at [facility]. However, when pt showed up she was informed bx was to be done at [facility]. So breast bx has been r/s on 10/30/23 at main campus in [place]. States she will have them fax Dr Crissie Dome th results.

## 2023-10-16 NOTE — Telephone Encounter (Signed)
 Lvm asking pt to call back.  Need to relay Dr. Timoteo Expose message and get answer to his question.

## 2023-10-16 NOTE — Telephone Encounter (Signed)
 Pt had breast biopsy done at [facility] on Monday but I don't have results yet. Can we call pt to see if she's been contacted with results yet?

## 2023-10-30 DIAGNOSIS — D0512 Intraductal carcinoma in situ of left breast: Secondary | ICD-10-CM | POA: Diagnosis not present

## 2023-10-30 DIAGNOSIS — R928 Other abnormal and inconclusive findings on diagnostic imaging of breast: Secondary | ICD-10-CM | POA: Diagnosis not present

## 2023-10-30 DIAGNOSIS — N6321 Unspecified lump in the left breast, upper outer quadrant: Secondary | ICD-10-CM | POA: Diagnosis not present

## 2023-10-30 DIAGNOSIS — C50912 Malignant neoplasm of unspecified site of left female breast: Secondary | ICD-10-CM | POA: Diagnosis not present

## 2023-10-30 DIAGNOSIS — C50412 Malignant neoplasm of upper-outer quadrant of left female breast: Secondary | ICD-10-CM | POA: Diagnosis not present

## 2023-10-31 ENCOUNTER — Encounter: Payer: Self-pay | Admitting: Family Medicine

## 2023-11-03 NOTE — Telephone Encounter (Signed)
 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  She has appt with breast clinic on Wednesday with [facility] oncology department.  Will follow along

## 2023-11-03 NOTE — Telephone Encounter (Signed)
 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 Monica Wise states pt has been referred to the breast clinic.

## 2023-11-05 DIAGNOSIS — C50912 Malignant neoplasm of unspecified site of left female breast: Secondary | ICD-10-CM | POA: Diagnosis not present

## 2023-11-06 ENCOUNTER — Other Ambulatory Visit

## 2023-11-17 DIAGNOSIS — H6122 Impacted cerumen, left ear: Secondary | ICD-10-CM | POA: Diagnosis not present

## 2023-11-17 DIAGNOSIS — J302 Other seasonal allergic rhinitis: Secondary | ICD-10-CM | POA: Diagnosis not present

## 2023-11-18 DIAGNOSIS — C50919 Malignant neoplasm of unspecified site of unspecified female breast: Secondary | ICD-10-CM | POA: Diagnosis not present

## 2023-11-18 DIAGNOSIS — Z1731 Human epidermal growth factor receptor 2 positive status: Secondary | ICD-10-CM | POA: Diagnosis not present

## 2023-11-18 DIAGNOSIS — C50912 Malignant neoplasm of unspecified site of left female breast: Secondary | ICD-10-CM | POA: Diagnosis not present

## 2023-11-18 DIAGNOSIS — Z170 Estrogen receptor positive status [ER+]: Secondary | ICD-10-CM | POA: Diagnosis not present

## 2023-11-18 DIAGNOSIS — Z1721 Progesterone receptor positive status: Secondary | ICD-10-CM | POA: Diagnosis not present

## 2023-12-02 DIAGNOSIS — C50912 Malignant neoplasm of unspecified site of left female breast: Secondary | ICD-10-CM | POA: Diagnosis not present

## 2023-12-16 ENCOUNTER — Ambulatory Visit: Attending: Family Medicine

## 2023-12-16 DIAGNOSIS — I350 Nonrheumatic aortic (valve) stenosis: Secondary | ICD-10-CM

## 2023-12-17 DIAGNOSIS — Z885 Allergy status to narcotic agent status: Secondary | ICD-10-CM | POA: Diagnosis not present

## 2023-12-17 DIAGNOSIS — J449 Chronic obstructive pulmonary disease, unspecified: Secondary | ICD-10-CM | POA: Diagnosis not present

## 2023-12-17 DIAGNOSIS — Z01818 Encounter for other preprocedural examination: Secondary | ICD-10-CM | POA: Diagnosis not present

## 2023-12-17 DIAGNOSIS — I10 Essential (primary) hypertension: Secondary | ICD-10-CM | POA: Diagnosis not present

## 2023-12-17 DIAGNOSIS — Z87891 Personal history of nicotine dependence: Secondary | ICD-10-CM | POA: Diagnosis not present

## 2023-12-17 DIAGNOSIS — J432 Centrilobular emphysema: Secondary | ICD-10-CM | POA: Diagnosis not present

## 2023-12-17 DIAGNOSIS — I38 Endocarditis, valve unspecified: Secondary | ICD-10-CM | POA: Diagnosis not present

## 2023-12-17 DIAGNOSIS — K219 Gastro-esophageal reflux disease without esophagitis: Secondary | ICD-10-CM | POA: Diagnosis not present

## 2023-12-17 DIAGNOSIS — Z882 Allergy status to sulfonamides status: Secondary | ICD-10-CM | POA: Diagnosis not present

## 2023-12-17 DIAGNOSIS — E7800 Pure hypercholesterolemia, unspecified: Secondary | ICD-10-CM | POA: Diagnosis not present

## 2023-12-17 LAB — ECHOCARDIOGRAM COMPLETE
AR max vel: 0.82 cm2
AV Area VTI: 0.8 cm2
AV Area mean vel: 0.79 cm2
AV Mean grad: 32 mmHg
AV Peak grad: 51.6 mmHg
Ao pk vel: 3.59 m/s

## 2023-12-23 ENCOUNTER — Telehealth: Payer: Self-pay

## 2023-12-23 ENCOUNTER — Ambulatory Visit: Payer: Self-pay | Admitting: Family Medicine

## 2023-12-23 ENCOUNTER — Telehealth: Payer: Self-pay | Admitting: Family Medicine

## 2023-12-23 DIAGNOSIS — I350 Nonrheumatic aortic (valve) stenosis: Secondary | ICD-10-CM

## 2023-12-23 DIAGNOSIS — I2510 Atherosclerotic heart disease of native coronary artery without angina pectoris: Secondary | ICD-10-CM

## 2023-12-23 DIAGNOSIS — I10 Essential (primary) hypertension: Secondary | ICD-10-CM

## 2023-12-23 DIAGNOSIS — I6523 Occlusion and stenosis of bilateral carotid arteries: Secondary | ICD-10-CM

## 2023-12-23 DIAGNOSIS — E7800 Pure hypercholesterolemia, unspecified: Secondary | ICD-10-CM

## 2023-12-23 NOTE — Telephone Encounter (Signed)
 Copied from CRM [id number]. Topic: General - Other >> Dec 23, 2023 11:37 AM Thersia BROCKS wrote: Reason for CRM: Patient called in regarding a miss call, would like a callback

## 2023-12-23 NOTE — Telephone Encounter (Unsigned)
 Copied from CRM [id number]. Topic: Clinical - Lab/Test Results >> Dec 23, 2023  5:15 PM Gennette ORN wrote: Reason for CRM: Patient read lab results, patient said she will follow back up with [facility] on the referral.

## 2023-12-23 NOTE — Telephone Encounter (Signed)
 Lvm asking pt to call back. Need to relay ECHO results and Dr Talmadge message (see Procedures, Result Notes- 12/16/23).

## 2023-12-24 NOTE — Telephone Encounter (Signed)
 Lvm asking pt to call back. Need to relay ECHO results and Dr Talmadge message (see Procedures, Result Notes- 12/16/23).  Mailing a letter.

## 2023-12-25 DIAGNOSIS — C50912 Malignant neoplasm of unspecified site of left female breast: Secondary | ICD-10-CM | POA: Diagnosis not present

## 2023-12-25 DIAGNOSIS — C50412 Malignant neoplasm of upper-outer quadrant of left female breast: Secondary | ICD-10-CM | POA: Diagnosis not present

## 2023-12-26 DIAGNOSIS — R0601 Orthopnea: Secondary | ICD-10-CM | POA: Diagnosis not present

## 2023-12-26 DIAGNOSIS — J4489 Other specified chronic obstructive pulmonary disease: Secondary | ICD-10-CM | POA: Diagnosis not present

## 2023-12-26 DIAGNOSIS — E785 Hyperlipidemia, unspecified: Secondary | ICD-10-CM | POA: Diagnosis not present

## 2023-12-26 DIAGNOSIS — Z8249 Family history of ischemic heart disease and other diseases of the circulatory system: Secondary | ICD-10-CM | POA: Diagnosis not present

## 2023-12-26 DIAGNOSIS — Z1721 Progesterone receptor positive status: Secondary | ICD-10-CM | POA: Diagnosis not present

## 2023-12-26 DIAGNOSIS — Z1732 Human epidermal growth factor receptor 2 negative status: Secondary | ICD-10-CM | POA: Diagnosis not present

## 2023-12-26 DIAGNOSIS — L905 Scar conditions and fibrosis of skin: Secondary | ICD-10-CM | POA: Diagnosis not present

## 2023-12-26 DIAGNOSIS — C50912 Malignant neoplasm of unspecified site of left female breast: Secondary | ICD-10-CM | POA: Diagnosis not present

## 2023-12-26 DIAGNOSIS — Z7951 Long term (current) use of inhaled steroids: Secondary | ICD-10-CM | POA: Diagnosis not present

## 2023-12-26 DIAGNOSIS — Z885 Allergy status to narcotic agent status: Secondary | ICD-10-CM | POA: Diagnosis not present

## 2023-12-26 DIAGNOSIS — Z888 Allergy status to other drugs, medicaments and biological substances status: Secondary | ICD-10-CM | POA: Diagnosis not present

## 2023-12-26 DIAGNOSIS — Z170 Estrogen receptor positive status [ER+]: Secondary | ICD-10-CM | POA: Diagnosis not present

## 2023-12-26 DIAGNOSIS — N6092 Unspecified benign mammary dysplasia of left breast: Secondary | ICD-10-CM | POA: Diagnosis not present

## 2023-12-26 DIAGNOSIS — Z882 Allergy status to sulfonamides status: Secondary | ICD-10-CM | POA: Diagnosis not present

## 2023-12-26 DIAGNOSIS — Z604 Social exclusion and rejection: Secondary | ICD-10-CM | POA: Diagnosis not present

## 2023-12-26 DIAGNOSIS — Z79899 Other long term (current) drug therapy: Secondary | ICD-10-CM | POA: Diagnosis not present

## 2023-12-26 DIAGNOSIS — Z87891 Personal history of nicotine dependence: Secondary | ICD-10-CM | POA: Diagnosis not present

## 2024-01-01 ENCOUNTER — Telehealth: Payer: Self-pay

## 2024-01-01 DIAGNOSIS — I2510 Atherosclerotic heart disease of native coronary artery without angina pectoris: Secondary | ICD-10-CM

## 2024-01-01 DIAGNOSIS — I350 Nonrheumatic aortic (valve) stenosis: Secondary | ICD-10-CM

## 2024-01-01 NOTE — Telephone Encounter (Signed)
Attempted to contact pt.  No answer, no vm.  Need to relay Dr. Synthia Innocent message.

## 2024-01-01 NOTE — Telephone Encounter (Signed)
 Copied from CRM [id number]. Topic: Clinical - Medical Advice >> Jan 01, 2024  9:57 AM Turkey A wrote: Reason for CRM: Patient would like for Nurse to call her regarding referral to [facility]. Patient said to call her cell phone because home phone is not working   Called patient she requested referral be changed to Dr. Gollan. She did not want to be seen at [facility].

## 2024-01-01 NOTE — Addendum Note (Signed)
 Addended by: RILLA BALLER on: 01/01/2024 04:49 PM   Modules accepted: Orders

## 2024-01-01 NOTE — Telephone Encounter (Addendum)
 New referral placed to [facility] heart care [place]   She may call [phone] to schedule appt

## 2024-01-05 NOTE — Telephone Encounter (Signed)
 Spoke pt relaying Dr Talmadge message. Pt verbalizes understanding but is currently driving and requests a call back to lvm with phn # for [facility].  Lvm for pt to call [phone] to schedule appt.

## 2024-01-06 DIAGNOSIS — C50912 Malignant neoplasm of unspecified site of left female breast: Secondary | ICD-10-CM | POA: Diagnosis not present

## 2024-01-07 DIAGNOSIS — C50912 Malignant neoplasm of unspecified site of left female breast: Secondary | ICD-10-CM | POA: Diagnosis not present

## 2024-01-13 DIAGNOSIS — C50912 Malignant neoplasm of unspecified site of left female breast: Secondary | ICD-10-CM | POA: Diagnosis not present

## 2024-01-20 ENCOUNTER — Other Ambulatory Visit: Payer: Self-pay | Admitting: Family Medicine

## 2024-01-20 DIAGNOSIS — E7800 Pure hypercholesterolemia, unspecified: Secondary | ICD-10-CM

## 2024-01-22 NOTE — Telephone Encounter (Signed)
 ERx

## 2024-02-20 ENCOUNTER — Telehealth: Payer: Self-pay | Admitting: *Deleted

## 2024-02-20 NOTE — Telephone Encounter (Signed)
 LMOVM to verify card hx.

## 2024-02-26 NOTE — Progress Notes (Unsigned)
 Cardiology Office Note  Date:  02/27/2024   ID:  Mazi, Schuff 25-Mar-1946, MRN [id number]  PCP:  Rilla Baller, MD   Chief Complaint  Patient presents with   New Patient (Initial Visit)    Establish care for Aortic Stenosis; was a former patient of Dr. Bosie.  Patient c/o shortness of breath with over exertion.     HPI:  Monica Wise a 78 y.o. female with past medical history of: Aortic valve stenosis Moderate diffuse aortic atherosclerosis Coronary calcification Hyperlipidemia Essential hypertension PAD, carotid stenosis Smoker, quit 2009 Who presents by referral from Dr. Rilla for coronary artery calcification on CT scan, aortic valve stenosis  On discussion today, walks with a cane, no recent falls Some shortness of breath on heavy exertion No regular exercise program  Recent cardiac imaging reviewed Echo June 2025 Moderate to severe aortic valve stenosis, mean gradient 32 mmHg, peak velocity 3.59 m/s  Echo September 2022 EF greater than 55% Mild aortic valve stenosis Aortic valve peak velocity 2.6 cm,  mean gradient 15.6 mmHg  CT scan chest January 2024 images pulled up and reviewed Moderate diffuse aortic atherosclerosis, coronary calcification noted  June 2025: breast cancer surgery Declined pills and radiation  Lab work reviewed Total cholesterol 217 LDL 130 On Zetia  Statin myalgias She prefers not to be on needles  EKG personally reviewed by myself on todays visit EKG Interpretation Date/Time:  Friday February 27 2024 14:06:52 EDT Ventricular Rate:  68 PR Interval:  130 QRS Duration:  100 QT Interval:  394 QTC Calculation: 418 R Axis:   20  Text Interpretation: Normal sinus rhythm Normal ECG When compared with ECG of 15-Apr-2013 13:31, No significant change was found Confirmed by Perla Lye [phone]) on 02/27/2024 2:14:48 PM      PMH:   has a past medical history of Adrenal adenoma, 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  PSH:    Past Surgical History:  Procedure Laterality Date   BREAST BIOPSY Right 2012   BREAST LUMPECTOMY Left 1995   benign mass   CARDIOVASCULAR STRESS TEST  12/2004   nl myoview, preserved LV fxn with EF 71% without reversible ischemia ([facility])   COLONOSCOPY  2010   hyperplastic polyps identified. rpt 10 yrs ([facility])   COLONOSCOPY  03/2020   rectal petechiae ([place])   COLONOSCOPY WITH PROPOFOL  N/A 04/26/2020   Procedure: COLONOSCOPY WITH PROPOFOL ;  Surgeon: Dessa Reyes ORN, MD;  Location: [facility] ENDOSCOPY;  Service: Endoscopy;  Laterality: N/A;   DEXA  09/2014   T -1.2 femur neck   ESOPHAGOGASTRODUODENOSCOPY  2010   [place]   KNEE SURGERY Right 2010   arthroscopic   KNEE SURGERY Left 2014    Current Outpatient Medications  Medication Sig Dispense Refill   acetaminophen  (TYLENOL ) 650 MG CR tablet Take 650 mg by mouth every 8 (eight) hours as needed for pain.     albuterol  (VENTOLIN  HFA) 108 (90 Base) MCG/ACT inhaler Inhale 2 puffs into the lungs every 6 (six) hours as needed for wheezing or shortness of breath. 8 g 2   Apple Cid Vn-Grn Tea-Bit Or-Cr (APPLE CIDER VINEGAR PLUS) TABS Take  2 tablets by mouth 3 (three) times daily before meals.     ascorbic acid (VITAMIN C) 250 MG CHEW Chew 250 mg by mouth daily.     aspirin-acetaminophen -caffeine (EXCEDRIN MIGRAINE) 250-250-65 MG tablet Take 1 tablet by mouth every 6 (six) hours as needed for headache.     B Complex Vitamins (VITAMIN  B-COMPLEX) TABS Take 1 tablet by mouth daily.     Cholecalciferol (VITAMIN D ) 2000 units CAPS Take 1 capsule by mouth daily.     diphenhydramine-acetaminophen  (TYLENOL  PM) 25-500 MG TABS tablet Take 1 tablet by mouth at bedtime as needed.     ezetimibe  (ZETIA ) 10 MG tablet TAKE 1 TABLET BY MOUTH EVERY DAY 90 tablet 2   famotidine (PEPCID) 20 MG tablet Take 20 mg by mouth as needed.      Fexofenadine HCl (ALLEGRA PO) Take by mouth daily.     fluticasone  furoate-vilanterol (BREO ELLIPTA ) 200-25 MCG/ACT AEPB Inhale 1 puff into the lungs daily. 60 each 10   furosemide  (LASIX ) 20 MG tablet Take 1 tablet (20 mg total) by mouth daily as needed for fluid. 30 tablet 1   ibuprofen  (ADVIL ) 200 MG tablet Take 3-4 tablets (600-800 mg total) by mouth daily as needed.     Loperamide HCl (ANTI-DIARRHEAL PO) Take by mouth.     Magnesium 250 MG TABS Take 1 tablet by mouth daily.     MELATONIN PO Take 5-10 mg by mouth at bedtime.     MISC NATURAL PRODUCTS PO Take by mouth as needed. Bladder Control     montelukast  (SINGULAIR ) 10 MG tablet TAKE 1 TABLET BY MOUTH EVERY DAY IN THE EVENING 90 tablet 4   Multiple Vitamin (MULTIVITAMIN) tablet Take 1 tablet by mouth daily.     potassium chloride  (KLOR-CON ) 10 MEQ tablet Take 1 tablet (10 mEq total) by mouth daily as needed. With lasix  30 tablet 1   pregabalin (LYRICA) 50 MG capsule Take 50 mg by mouth 2 (two) times daily.     telmisartan  (MICARDIS ) 20 MG tablet Take 1 tablet (20 mg total) by mouth daily. 90 tablet 4   tiotropium (SPIRIVA  HANDIHALER) 18 MCG inhalation capsule INHALE 1 CAPSULE VIA HANDIHALER ONCE DAILY AT THE SAME TIME EVERY DAY 30 capsule 10   TURMERIC PO Take 1,000 mg by mouth daily.     No current facility-administered medications for this visit.     Allergies:   Altace [ramipril], Benicar [olmesartan], Statins, Sulfa antibiotics, Codeine, and Meperidine   Social History:  The patient  reports that she quit smoking about 15 years ago. Her smoking  use included cigarettes. She started smoking about 45 years ago. She has a 30 pack-year smoking history. She has never used smokeless tobacco. She reports current alcohol use. She reports that she does not use drugs.   Family History:   family history includes CAD (age of onset: 40) in her father; Cancer in her maternal aunt; Diverticulitis in her mother; Heart failure in her mother; Sudden death (age of onset: 97) in her brother.    Review of Systems: Review of Systems  Constitutional: Negative.   HENT: Negative.    Respiratory: Negative.    Cardiovascular: Negative.   Gastrointestinal: Negative.   Musculoskeletal: Negative.   Neurological: Negative.   Psychiatric/Behavioral: Negative.    All other systems reviewed and are negative.   PHYSICAL EXAM: VS:  Ht 5' 3 (1.6 m)   Wt 230 lb 2 oz (104.4 kg)   BMI 40.76 kg/m  , BMI  Body mass index is 40.76 kg/m. GEN: Well nourished, well developed, in no acute distress HEENT: normal Neck: no JVD, carotid bruits, or masses Cardiac: RRR; 3/6 SEm RSB,  No rubs, or gallops,no edema  Respiratory:  clear to auscultation bilaterally, normal work of breathing GI: soft, nontender, nondistended, + BS MS: no deformity or atrophy Skin: warm and dry, no rash Neuro:  Strength and sensation are intact Psych: euthymic mood, full affect    Recent Labs: 10/10/2023: ALT 13; BUN 15; Creatinine, Ser 1.07; Hemoglobin 13.2; Platelets 231.0; Potassium 4.1; Sodium 143; TSH 2.19    Lipid Panel Lab Results  Component Value Date   CHOL 217 (H) 10/10/2023   HDL 65.40 10/10/2023   LDLCALC 130 (H) 10/10/2023   TRIG 106.0 10/10/2023      Wt Readings from Last 3 Encounters:  02/27/24 230 lb 2 oz (104.4 kg)  10/07/23 231 lb 2 oz (104.8 kg)  08/14/23 233 lb (105.7 kg)       ASSESSMENT AND PLAN:  Problem List Items Addressed This Visit       Cardiology Problems   Essential hypertension   Relevant Orders   EKG 12-Lead   Carotid stenosis,  asymptomatic, bilateral   Relevant Orders   EKG 12-Lead   Aortic stenosis   Relevant Orders   EKG 12-Lead   Aortic atherosclerosis (HCC) - Primary   Relevant Orders   EKG 12-Lead   Coronary artery calcification seen on CAT scan   Relevant Orders   EKG 12-Lead   Aortic valve stenosis Moderate to severe on recent echocardiogram Denies significant symptoms Repeat echo 1 year Discussed SAVR and TAVR options  Aortic atherosclerosis CT scan images pulled up and reviewed In the setting of hyperlipidemia on Zetia  alone History of statin myalgias Declining injection with Repatha   recommend she start bempedoic acid  with Zetia   Essential hypertension Blood pressure is well controlled on today's visit. No changes made to the medications.  PAD/ History of carotid stenosis Aggressive lipid management as above Consider repeat carotid in follow-up   Signed, Tim Emeline Simpson, M.D., Ph.D. [facility] [place], [place] [phone]

## 2024-02-27 ENCOUNTER — Ambulatory Visit: Attending: Cardiovascular Disease | Admitting: Cardiovascular Disease

## 2024-02-27 ENCOUNTER — Telehealth: Payer: Self-pay | Admitting: Pharmacy Technician

## 2024-02-27 ENCOUNTER — Encounter: Payer: Self-pay | Admitting: Cardiovascular Disease

## 2024-02-27 VITALS — BP 164/68 | HR 68 | Ht 63.0 in | Wt 230.1 lb

## 2024-02-27 DIAGNOSIS — I6523 Occlusion and stenosis of bilateral carotid arteries: Secondary | ICD-10-CM

## 2024-02-27 DIAGNOSIS — I700 Atherosclerosis of aorta: Secondary | ICD-10-CM

## 2024-02-27 DIAGNOSIS — I10 Essential (primary) hypertension: Secondary | ICD-10-CM

## 2024-02-27 DIAGNOSIS — I350 Nonrheumatic aortic (valve) stenosis: Secondary | ICD-10-CM | POA: Diagnosis not present

## 2024-02-27 DIAGNOSIS — I2510 Atherosclerotic heart disease of native coronary artery without angina pectoris: Secondary | ICD-10-CM | POA: Diagnosis not present

## 2024-02-27 MED ORDER — NEXLETOL 180 MG PO TABS: 180.0000 mg | ORAL_TABLET | Freq: Every day | ORAL | Status: DC

## 2024-02-27 MED ORDER — BEMPEDOIC ACID 180 MG PO TABS: 180.0000 mg | ORAL_TABLET | Freq: Every day | ORAL | 11 refills | Status: AC

## 2024-02-27 NOTE — Telephone Encounter (Signed)
   Pharmacy Patient Advocate Encounter   Received notification from CoverMyMeds that prior authorization for nexletol  is required/requested.   Insurance verification completed.   The patient is insured through [facility] .   Per test claim: PA required; PA submitted to above mentioned insurance via Latent Key/confirmation #/EOC [id number] Status is pending

## 2024-02-27 NOTE — Patient Instructions (Addendum)
 Monitor blood pressure, call with numbers  Medication Instructions:   Please start bempedoic acid  for cholesterol  If you need a refill on your cardiac medications before your next appointment, please call your pharmacy.   Lab work: No new labs needed  Testing/Procedures:  Your physician has requested that you have an echocardiogram in one year. Echocardiography is a painless test that uses sound waves to create images of your heart. It provides your doctor with information about the size and shape of your heart and how well your heart's chambers and valves are working.   You may receive an ultrasound enhancing agent through an IV if needed to better visualize your heart during the echo. This procedure takes approximately one hour.  There are no restrictions for this procedure.  This will take place at [address] [facility] [address]) [address], [place] [address]  Please note: We ask at that you not bring children with you during ultrasound (echo/ vascular) testing. Due to room size and safety concerns, children are not allowed in the ultrasound rooms during exams. Our front office staff cannot provide observation of children in our lobby area while testing is being conducted. An adult accompanying a patient to their appointment will only be allowed in the ultrasound room at the discretion of the ultrasound technician under special circumstances. We apologize for any inconvenience.   Your physician has requested that you have a carotid duplex in one year. This test is an ultrasound of the carotid arteries in your neck. It looks at blood flow through these arteries that supply the brain with blood.   Allow one hour for this exam.  There are no restrictions or special instructions.  This will take place at [address] [facility] [address]) [address], [place] [address]  Please note: We ask at that you not bring children with you during ultrasound (echo/ vascular) testing. Due to  room size and safety concerns, children are not allowed in the ultrasound rooms during exams. Our front office staff cannot provide observation of children in our lobby area while testing is being conducted. An adult accompanying a patient to their appointment will only be allowed in the ultrasound room at the discretion of the ultrasound technician under special circumstances. We apologize for any inconvenience.   Follow-Up: At [facility], you and your health needs are our priority.  As part of our continuing mission to provide you with exceptional heart care, we have created designated Provider Care Teams.  These Care Teams include your primary Cardiologist (physician) and Advanced Practice Providers (APPs -  Physician Assistants and Nurse Practitioners) who all work together to provide you with the care you need, when you need it.  You will need a follow up appointment in 12 months after echo and carotid   Providers on your designated Care Team:   Lonni Meager, NP Bernardino Bring, PA-C Cadence [place], [place]  COVID-19 Vaccine Information can be found at: [email or web address] For questions related to vaccine distribution or appointments, please email [email or web address] or call [phone].

## 2024-03-02 NOTE — Telephone Encounter (Signed)
 Pharmacy Patient Advocate Encounter  Received notification from [employer] that Prior Authorization for nexletol  has been APPROVED from 03/02/24 to 08/28/24   PA #/Case ID/Reference #: [id number]    [facility] to [facility]

## 2024-03-25 DIAGNOSIS — R2689 Other abnormalities of gait and mobility: Secondary | ICD-10-CM | POA: Diagnosis not present

## 2024-03-25 DIAGNOSIS — G629 Polyneuropathy, unspecified: Secondary | ICD-10-CM | POA: Diagnosis not present

## 2024-03-25 DIAGNOSIS — G5603 Carpal tunnel syndrome, bilateral upper limbs: Secondary | ICD-10-CM | POA: Diagnosis not present

## 2024-03-29 ENCOUNTER — Encounter: Payer: Self-pay | Admitting: Family Medicine

## 2024-03-29 ENCOUNTER — Ambulatory Visit (INDEPENDENT_AMBULATORY_CARE_PROVIDER_SITE_OTHER): Admitting: Family Medicine

## 2024-03-29 ENCOUNTER — Ambulatory Visit: Payer: Self-pay

## 2024-03-29 VITALS — BP 128/64 | HR 69 | Temp 97.9°F | Ht 63.0 in | Wt 227.4 lb

## 2024-03-29 DIAGNOSIS — J029 Acute pharyngitis, unspecified: Secondary | ICD-10-CM | POA: Diagnosis not present

## 2024-03-29 DIAGNOSIS — U071 COVID-19: Secondary | ICD-10-CM | POA: Diagnosis not present

## 2024-03-29 DIAGNOSIS — R0981 Nasal congestion: Secondary | ICD-10-CM | POA: Diagnosis not present

## 2024-03-29 LAB — POC COVID19 BINAXNOW: SARS Coronavirus 2 Ag: POSITIVE — AB

## 2024-03-29 LAB — POCT RAPID STREP A (OFFICE): Rapid Strep A Screen: NEGATIVE

## 2024-03-29 MED ORDER — PREDNISONE 10 MG PO TABS: ORAL_TABLET | ORAL | 0 refills | Status: AC

## 2024-03-29 MED ORDER — MOLNUPIRAVIR EUA 200MG CAPSULE: 4.0000 | ORAL_CAPSULE | Freq: Two times a day (BID) | ORAL | 0 refills | Status: AC

## 2024-03-29 NOTE — Telephone Encounter (Signed)
 Will see patient then Agree with ER and UC precautions

## 2024-03-29 NOTE — Telephone Encounter (Signed)
 FYI Only or Action Required?: FYI only for provider.  Patient was last seen in primary care on 10/07/2023 by Rilla Baller, MD.  Called Nurse Triage reporting Sore Throat.  Symptoms began yesterday.  Interventions attempted: Nothing.  Symptoms are: gradually worsening.  Triage Disposition: Home Care  Patient/caregiver understands and will follow disposition?: No, wishes to speak with PCP  Copied from CRM #[id number]. Topic: Clinical - Red Word Triage >> Mar 29, 2024 10:12 AM Debby BROCKS wrote: Red Word that prompted transfer to Nurse Triage: throat burns and hurts. Patient has been up all night Reason for Disposition  [1] Sore throat is the only symptom AND [2] sore throat present < 48 hours  Answer Assessment - Initial Assessment Questions 1. ONSET: When did the throat start hurting? (Hours or days ago)      Last night 2. SEVERITY: How bad is the sore throat? (Scale 1-10; mild, moderate or severe)     Pretty bad 3. STREP EXPOSURE: Has there been any exposure to strep within the past week? If Yes, ask: What type of contact occurred?      denies 4.  VIRAL SYMPTOMS: Are there any symptoms of a cold, such as a runny nose, cough, hoarse voice or red eyes?      Cough, sore throat 5. FEVER: Do you have a fever? If Yes, ask: What is your temperature, how was it measured, and when did it start?     denies 6. PUS ON THE TONSILS: Is there pus on the tonsils in the back of your throat?     Has not looked 7. OTHER SYMPTOMS: Do you have any other symptoms? (e.g., difficulty breathing, headache, rash)     Denies,  Protocols used: Sore Throat-A-AH

## 2024-03-29 NOTE — Assessment & Plan Note (Signed)
 Day 2  Congestion/cough and ST Reassuring exam  Pt has copd / pulse ox is 95% on RA    Discussed pros/cons for antiviral (pt tolerated molnupiravir  in past but not paxlovid ) Sent to pharmacy- aware ins may not cover Disc symptomatic care - see instructions on AVS  Prednisone  sent for congestion and chest tightness Pt has tessalon  pearles at home Encouraged to continue mt inhalers and albuterol  prn   Update if not starting to improve in a week or if worsening  Call back and Er precautions noted in detail today   Discussed isolation and masking precautions as well

## 2024-03-29 NOTE — Patient Instructions (Signed)
 Drink fluids and rest  Delsym and tessalon  pearles are good for cough  Prednisone  will help congestion and tight chest  Take the anti viral (molnupiravir ) as directed - if it is not covered by insurance  Nasal saline for congestion as needed  Tylenol  for fever or pain or headache  Please alert us  if symptoms worsen (if severe or short of breath please go to the ER)    Update if not starting to improve in a week or if worsening    Continue to isolate until you feel better  Mask for 10 additional days when you go back in public

## 2024-03-29 NOTE — Progress Notes (Signed)
 Subjective:    Patient ID: Monica Wise, female    DOB: June 10, 1946, 78 y.o.   MRN: [id number]  HPI  Wt Readings from Last 3 Encounters:  03/29/24 227 lb 6 oz (103.1 kg)  02/27/24 230 lb 2 oz (104.4 kg)  10/07/23 231 lb 2 oz (104.8 kg)   40.28 kg/m  Vitals:   03/29/24 1413  BP: 128/64  Pulse: 69  Temp: 97.9 F (36.6 C)  SpO2: 95%    78 yo pt of Dr KANDICE presents with  ST Respiratory symptoms    Symptoms started yesterday Throat hurts  Coughing - all night long  More dry than productive  No wheezing or tightness   Sinus pressure Nasal congestion   Former smoker   Lab Results  Component Value Date   NA 143 10/10/2023   K 4.1 10/10/2023   CO2 31 10/10/2023   GLUCOSE 121 (H) 10/10/2023   BUN 15 10/10/2023   CREATININE 1.07 10/10/2023   CALCIUM 9.5 10/10/2023   GFR 49.38 (L) 10/10/2023   GFRNONAA >60 04/15/2013   Results for orders placed or performed in visit on 03/29/24  POC COVID-19   Collection Time: 03/29/24  2:34 PM  Result Value Ref Range   SARS Coronavirus 2 Ag Positive (A) Negative  Rapid Strep A   Collection Time: 03/29/24  2:34 PM  Result Value Ref Range   Rapid Strep A Screen Negative Negative     Patient Active Problem List   Diagnosis Date Noted   COVID-19 03/29/2024   Breast cancer, left breast (HCC) 10/07/2023   Numbness of left foot 11/10/2021   Psoriasis 09/08/2021   Arthralgia of hand 09/08/2021   Aortic atherosclerosis 07/28/2021   Left cervical radiculopathy 09/25/2020   Left hand weakness 08/24/2020   Osteoporosis 03/22/2020   Statin myopathy 08/04/2019   Pulmonary nodules 01/16/2019   Closed wedge compression fracture of L1 vertebra (HCC) 11/06/2017   Aortic stenosis 10/10/2017   Carotid stenosis, asymptomatic, bilateral 11/16/2016   Coronary artery calcification seen on CAT scan 10/18/2016   Adrenal adenoma, right 10/18/2016   Medicare annual wellness visit, subsequent 09/19/2014   Advanced care planning/counseling  discussion 09/19/2014   Health maintenance examination 09/19/2014   IBS (irritable bowel syndrome)    Essential hypertension 06/17/2014   Osteoarthritis    Perennial allergic rhinitis    Ex-smoker    Chronic diarrhea    Centrilobular emphysema (HCC)    Obesity, morbid, BMI 40.0-49.9 (HCC)    GERD (gastroesophageal reflux disease)    Hyperlipidemia    Past Medical History:  Diagnosis Date   Adrenal adenoma, right 10/18/2016   Incidental by CT 09/2016   Aortic valve sclerosis 2014   withoout stenosis, mild aortic insufficiency - Dr Bosie   Asthma    CAD (coronary artery disease) 10/18/2016   By CT 09/2016   Centrilobular emphysema (HCC) 10/18/2016   By CT 09/2016   Chronic diarrhea 2011   worse in am, ?IBS   COPD (chronic obstructive pulmonary disease) (HCC) 2005   chronic bronchitis - spirometry FVC 70%, FEV1 72%, abnormal loop volume (unknown date)   Essential hypertension    Ex-smoker    GERD (gastroesophageal reflux disease)    Heart murmur    History of chicken pox    History of measles    Hyperlipidemia    IBS (irritable bowel syndrome)    Lumbago    Lump or mass in breast 2012   right breast US  guided finesse 1 o'clock  Obesity    Osteoarthritis    knees s/p surgeries   Osteopenia 09/2014   mild by DEXA (-1.2 femur neck)   Perennial allergic rhinitis    Weakness of left upper extremity    Past Surgical History:  Procedure Laterality Date   BREAST BIOPSY Right 2012   BREAST LUMPECTOMY Left 1995   benign mass   CARDIOVASCULAR STRESS TEST  12/2004   nl myoview, preserved LV fxn with EF 71% without reversible ischemia ([facility])   COLONOSCOPY  2010   hyperplastic polyps identified. rpt 10 yrs ([facility])   COLONOSCOPY  03/2020   rectal petechiae ([facility])   COLONOSCOPY WITH PROPOFOL  N/A 04/26/2020   Procedure: COLONOSCOPY WITH PROPOFOL ;  Surgeon: Dessa Reyes ORN, MD;  Location: [facility] END[facility];  Service: Endoscopy;  Laterality: N/A;   DEXA  09/2014   T -1.2 femur  neck   ESOPHAGOGASTRODUODENOSCOPY  2010   [facility]   KNEE SURGERY Right 2010   arthroscopic   KNEE SURGERY Left 2014   Social History   Tobacco Use   Smoking status: Former    Current packs/day: 0.00    Average packs/day: 1 pack/day for 30.0 years (30.0 ttl pk-yrs)    Types: Cigarettes    Start date: 05/31/1978    Quit date: 05/31/2008    Years since quitting: 15.8   Smokeless tobacco: Never  Vaping Use   Vaping status: Never Used  Substance Use Topics   Alcohol use: Yes    Comment: occasionally wine   Drug use: No   Family History  Problem Relation Age of Onset   Diverticulitis Mother    Heart failure Mother    Cancer Maternal Aunt        bone, and unsure   CAD Father 16       MI x3   Sudden death Brother 60   Allergies  Allergen Reactions   Altace [Ramipril]     unknown   Benicar [Olmesartan]     unknown   Statins Other (See Comments)    Myalgias to crestor, refused further statins  Other Reaction(s): Other (See Comments)    Myalgias to crestor, refused further statins    Other reaction(s): Other (See Comments) Myalgias to crestor, refused further statins   Sulfa Antibiotics     Unknown reaction per patient  WHEN A CHILD TOLD BY PARENT SHE IS ALLERGIC   Codeine Nausea And Vomiting   Meperidine Nausea And Vomiting    DEMEROL   Current Outpatient Medications on File Prior to Visit  Medication Sig Dispense Refill   acetaminophen  (TYLENOL ) 650 MG CR tablet Take 650 mg by mouth every 8 (eight) hours as needed for pain.     albuterol  (VENTOLIN  HFA) 108 (90 Base) MCG/ACT inhaler Inhale 2 puffs into the lungs every 6 (six) hours as needed for wheezing or shortness of breath. 8 g 2   Apple Cid Vn-Grn Tea-Bit Or-Cr (APPLE CIDER VINEGAR PLUS) TABS Take 2 tablets by mouth 3 (three) times daily before meals.     ascorbic acid (VITAMIN C) 250 MG CHEW Chew 250 mg by mouth daily.     aspirin-acetaminophen -caffeine (EXCEDRIN MIGRAINE) 250-250-65 MG tablet Take 1 tablet by  mouth every 6 (six) hours as needed for headache.     B Complex Vitamins (VITAMIN B-COMPLEX) TABS Take 1 tablet by mouth daily.     Bempedoic Acid  180 MG TABS Take 1 tablet (180 mg total) by mouth daily. 30 tablet 11   Cholecalciferol (VITAMIN D ) 2000 units CAPS  Take 1 capsule by mouth daily.     diphenhydramine-acetaminophen  (TYLENOL  PM) 25-500 MG TABS tablet Take 1 tablet by mouth at bedtime as needed.     ezetimibe  (ZETIA ) 10 MG tablet TAKE 1 TABLET BY MOUTH EVERY DAY 90 tablet 2   famotidine (PEPCID) 20 MG tablet Take 20 mg by mouth as needed.      Fexofenadine HCl (ALLEGRA PO) Take by mouth daily.     fluticasone  furoate-vilanterol (BREO ELLIPTA ) 200-25 MCG/ACT AEPB Inhale 1 puff into the lungs daily. 60 each 10   furosemide  (LASIX ) 20 MG tablet Take 1 tablet (20 mg total) by mouth daily as needed for fluid. 30 tablet 1   ibuprofen  (ADVIL ) 200 MG tablet Take 3-4 tablets (600-800 mg total) by mouth daily as needed.     Loperamide HCl (ANTI-DIARRHEAL PO) Take by mouth.     Magnesium 250 MG TABS Take 1 tablet by mouth daily.     MELATONIN PO Take 5-10 mg by mouth at bedtime.     MISC NATURAL PRODUCTS PO Take by mouth as needed. Bladder Control     montelukast  (SINGULAIR ) 10 MG tablet TAKE 1 TABLET BY MOUTH EVERY DAY IN THE EVENING 90 tablet 4   Multiple Vitamin (MULTIVITAMIN) tablet Take 1 tablet by mouth daily.     potassium chloride  (KLOR-CON ) 10 MEQ tablet Take 1 tablet (10 mEq total) by mouth daily as needed. With lasix  30 tablet 1   pregabalin (LYRICA) 50 MG capsule Take 50 mg by mouth 2 (two) times daily.     telmisartan  (MICARDIS ) 20 MG tablet Take 1 tablet (20 mg total) by mouth daily. 90 tablet 4   tiotropium (SPIRIVA  HANDIHALER) 18 MCG inhalation capsule INHALE 1 CAPSULE VIA HANDIHALER ONCE DAILY AT THE SAME TIME EVERY DAY 30 capsule 10   TURMERIC PO Take 1,000 mg by mouth daily.     No current facility-administered medications on file prior to visit.    Review of Systems   Constitutional:  Positive for fatigue. Negative for activity change, appetite change, fever and unexpected weight change.  HENT:  Positive for congestion, postnasal drip, rhinorrhea, sinus pressure, sneezing, sore throat and voice change. Negative for ear pain, facial swelling, sinus pain and trouble swallowing.   Eyes:  Negative for pain, discharge, redness, itching and visual disturbance.  Respiratory:  Positive for cough. Negative for chest tightness, shortness of breath, wheezing and stridor.   Cardiovascular:  Negative for chest pain and palpitations.  Gastrointestinal:  Negative for abdominal pain, blood in stool, constipation, diarrhea, nausea and vomiting.  Endocrine: Negative for cold intolerance, heat intolerance, polydipsia and polyuria.  Genitourinary:  Negative for difficulty urinating, dysuria, frequency, hematuria and urgency.  Musculoskeletal:  Negative for arthralgias, joint swelling and myalgias.  Skin:  Negative for pallor and rash.  Neurological:  Negative for dizziness, tremors, weakness, light-headedness, numbness and headaches.  Hematological:  Negative for adenopathy. Does not bruise/bleed easily.  Psychiatric/Behavioral:  Negative for confusion, decreased concentration and dysphoric mood. The patient is not nervous/anxious.        Objective:   Physical Exam Constitutional:      General: She is not in acute distress.    Appearance: Normal appearance. She is well-developed. She is obese. She is not ill-appearing, toxic-appearing or diaphoretic.  HENT:     Head: Normocephalic and atraumatic.     Comments: Nares are injected and congested      Right Ear: Tympanic membrane, ear canal and external ear normal.  Left Ear: Tympanic membrane, ear canal and external ear normal.     Nose: Congestion and rhinorrhea present.     Mouth/Throat:     Mouth: Mucous membranes are moist.     Pharynx: Oropharynx is clear. No oropharyngeal exudate or posterior oropharyngeal  erythema.     Comments: Clear pnd   Hoarse voice   Clear pnd  Eyes:     General:        Right eye: No discharge.        Left eye: No discharge.     Conjunctiva/sclera: Conjunctivae normal.     Pupils: Pupils are equal, round, and reactive to light.  Cardiovascular:     Rate and Rhythm: Normal rate.     Heart sounds: Normal heart sounds.  Pulmonary:     Effort: Pulmonary effort is normal. No respiratory distress.     Breath sounds: No stridor. Rhonchi present. No wheezing or rales.     Comments: Diffusely distant bs Few scattered rhonchi  Chest:     Chest wall: No tenderness.  Musculoskeletal:     Cervical back: Normal range of motion and neck supple.  Lymphadenopathy:     Cervical: No cervical adenopathy.  Skin:    General: Skin is warm and dry.     Capillary Refill: Capillary refill takes less than 2 seconds.     Findings: No rash.  Neurological:     Mental Status: She is alert.     Cranial Nerves: No cranial nerve deficit.  Psychiatric:        Mood and Affect: Mood normal.           Assessment & Plan:   Problem List Items Addressed This Visit       Other   COVID-19 - Primary   Day 2  Congestion/cough and ST Reassuring exam  Pt has copd / pulse ox is 95% on RA    Discussed pros/cons for antiviral (pt tolerated molnupiravir  in past but not paxlovid ) Sent to pharmacy- aware ins may not cover Disc symptomatic care - see instructions on AVS  Prednisone  sent for congestion and chest tightness Pt has tessalon  pearles at home Encouraged to continue mt inhalers and albuterol  prn   Update if not starting to improve in a week or if worsening  Call back and Er precautions noted in detail today   Discussed isolation and masking precautions as well       Relevant Medications   molnupiravir  EUA (LAGEVRIO ) 200 mg CAPS capsule   Other Visit Diagnoses       Sore throat       Relevant Orders   POC COVID-19 (Completed)   Rapid Strep A (Completed)     Sinus  congestion       Relevant Orders   POC COVID-19 (Completed)

## 2024-04-25 ENCOUNTER — Other Ambulatory Visit: Payer: Self-pay | Admitting: Family Medicine

## 2024-04-25 DIAGNOSIS — N289 Disorder of kidney and ureter, unspecified: Secondary | ICD-10-CM

## 2024-04-26 ENCOUNTER — Other Ambulatory Visit (INDEPENDENT_AMBULATORY_CARE_PROVIDER_SITE_OTHER)

## 2024-04-26 DIAGNOSIS — N289 Disorder of kidney and ureter, unspecified: Secondary | ICD-10-CM | POA: Diagnosis not present

## 2024-04-26 LAB — RENAL FUNCTION PANEL
Albumin: 4.4 g/dL (ref 3.5–5.2)
BUN: 19 mg/dL (ref 6–23)
CO2: 30 meq/L (ref 19–32)
Calcium: 9.6 mg/dL (ref 8.4–10.5)
Chloride: 104 meq/L (ref 96–112)
Creatinine, Ser: 1.2 mg/dL (ref 0.40–1.20)
GFR: 42.87 mL/min — ABNORMAL LOW (ref >60.00)
Glucose, Bld: 102 mg/dL — ABNORMAL HIGH (ref 70–99)
Phosphorus: 3.5 mg/dL (ref 2.3–4.6)
Potassium: 4.6 meq/L (ref 3.5–5.1)
Sodium: 142 meq/L (ref 135–145)

## 2024-05-04 ENCOUNTER — Ambulatory Visit: Payer: Self-pay | Admitting: Family Medicine

## 2024-05-04 NOTE — Telephone Encounter (Signed)
 Left message to return call to office. Ok to [employer] below.

## 2024-05-06 ENCOUNTER — Ambulatory Visit (INDEPENDENT_AMBULATORY_CARE_PROVIDER_SITE_OTHER)

## 2024-05-06 DIAGNOSIS — Z23 Encounter for immunization: Secondary | ICD-10-CM | POA: Diagnosis not present

## 2024-05-10 ENCOUNTER — Telehealth: Payer: Self-pay | Admitting: Family Medicine

## 2024-05-10 DIAGNOSIS — R2689 Other abnormalities of gait and mobility: Secondary | ICD-10-CM

## 2024-05-10 DIAGNOSIS — G6289 Other specified polyneuropathies: Secondary | ICD-10-CM

## 2024-05-10 NOTE — Telephone Encounter (Signed)
 Copied from CRM [id number]. Topic: Clinical - Medical Advice >> May 10, 2024 11:55 AM Nessti S wrote: Reason for CRM: pt called because she would need a prescription for a walker. Call back number [phone]

## 2024-05-11 DIAGNOSIS — G629 Polyneuropathy, unspecified: Secondary | ICD-10-CM | POA: Insufficient documentation

## 2024-05-11 NOTE — Telephone Encounter (Signed)
 Rx written for rolling walker with seat, placed in CMA box.  Plz verify with patient - is this for neuropathy and imbalance?

## 2024-05-12 NOTE — Telephone Encounter (Signed)
 Script placed in pending folder.  Called patient number x 3 did not ring and no voicemail or person picked up. Will call back later to see if will go through.

## 2024-05-13 ENCOUNTER — Telehealth: Payer: Self-pay

## 2024-05-13 NOTE — Telephone Encounter (Signed)
 Called patient she states that she needs for her  neuropathy. I have added to script. Patient request to come by and pick up paper script. I have put at reception and made copy to send to chart to scan.

## 2024-08-05 NOTE — Telephone Encounter (Signed)
 Error

## 2024-08-06 ENCOUNTER — Telehealth: Payer: Self-pay | Admitting: Pharmacy Technician

## 2024-08-06 NOTE — Telephone Encounter (Signed)
" ° °  Pharmacy Patient Advocate Encounter  Received notification from [facility] that Prior Authorization for nexletol  has been APPROVED from 07/01/24 to 06/30/25     [id number] "

## 2024-08-16 ENCOUNTER — Ambulatory Visit: Payer: Medicare Other

## 2024-10-13 ENCOUNTER — Encounter: Admitting: Family Medicine
